# Patient Record
Sex: Male | Born: 1950 | Race: White | Hispanic: No | Marital: Married | Smoking: Former smoker
Health system: Southern US, Community
[De-identification: ages and names within clinical notes are randomized; demographics above are authoritative.]

## PROBLEM LIST (undated history)

## (undated) DIAGNOSIS — R0600 Dyspnea, unspecified: Secondary | ICD-10-CM

## (undated) DIAGNOSIS — M1990 Unspecified osteoarthritis, unspecified site: Secondary | ICD-10-CM

## (undated) DIAGNOSIS — I272 Pulmonary hypertension, unspecified: Secondary | ICD-10-CM

## (undated) DIAGNOSIS — I10 Essential (primary) hypertension: Secondary | ICD-10-CM

## (undated) DIAGNOSIS — R0602 Shortness of breath: Secondary | ICD-10-CM

## (undated) DIAGNOSIS — G4730 Sleep apnea, unspecified: Secondary | ICD-10-CM

## (undated) DIAGNOSIS — M109 Gout, unspecified: Secondary | ICD-10-CM

## (undated) DIAGNOSIS — N200 Calculus of kidney: Secondary | ICD-10-CM

## (undated) DIAGNOSIS — J45909 Unspecified asthma, uncomplicated: Secondary | ICD-10-CM

## (undated) DIAGNOSIS — E785 Hyperlipidemia, unspecified: Secondary | ICD-10-CM

## (undated) DIAGNOSIS — I4891 Unspecified atrial fibrillation: Secondary | ICD-10-CM

## (undated) DIAGNOSIS — T7840XA Allergy, unspecified, initial encounter: Secondary | ICD-10-CM

## (undated) DIAGNOSIS — Z8619 Personal history of other infectious and parasitic diseases: Secondary | ICD-10-CM

## (undated) DIAGNOSIS — I872 Venous insufficiency (chronic) (peripheral): Secondary | ICD-10-CM

## (undated) DIAGNOSIS — T753XXA Motion sickness, initial encounter: Secondary | ICD-10-CM

## (undated) DIAGNOSIS — R739 Hyperglycemia, unspecified: Secondary | ICD-10-CM

## (undated) DIAGNOSIS — R319 Hematuria, unspecified: Secondary | ICD-10-CM

## (undated) DIAGNOSIS — Q211 Atrial septal defect, unspecified: Secondary | ICD-10-CM

## (undated) DIAGNOSIS — Z87442 Personal history of urinary calculi: Secondary | ICD-10-CM

## (undated) DIAGNOSIS — R7301 Impaired fasting glucose: Secondary | ICD-10-CM

## (undated) DIAGNOSIS — L0390 Cellulitis, unspecified: Secondary | ICD-10-CM

## (undated) DIAGNOSIS — M549 Dorsalgia, unspecified: Secondary | ICD-10-CM

## (undated) DIAGNOSIS — H409 Unspecified glaucoma: Secondary | ICD-10-CM

## (undated) DIAGNOSIS — M1712 Unilateral primary osteoarthritis, left knee: Secondary | ICD-10-CM

## (undated) HISTORY — DX: Cellulitis, unspecified: L03.90

## (undated) HISTORY — DX: Venous insufficiency (chronic) (peripheral): I87.2

## (undated) HISTORY — DX: Hyperlipidemia, unspecified: E78.5

## (undated) HISTORY — DX: Gout, unspecified: M10.9

## (undated) HISTORY — DX: Allergy, unspecified, initial encounter: T78.40XA

## (undated) HISTORY — DX: Dorsalgia, unspecified: M54.9

## (undated) HISTORY — DX: Unspecified asthma, uncomplicated: J45.909

## (undated) HISTORY — PX: EYE SURGERY: SHX253

## (undated) HISTORY — DX: Unspecified glaucoma: H40.9

## (undated) HISTORY — DX: Unspecified atrial fibrillation: I48.91

## (undated) HISTORY — DX: Sleep apnea, unspecified: G47.30

## (undated) HISTORY — PX: ENDOVENOUS ABLATION SAPHENOUS VEIN W/ LASER: SUR449

## (undated) HISTORY — DX: Calculus of kidney: N20.0

## (undated) HISTORY — PX: APPENDECTOMY: SHX54

## (undated) HISTORY — DX: Essential (primary) hypertension: I10

## (undated) HISTORY — PX: CARDIAC CATHETERIZATION: SHX172

## (undated) HISTORY — DX: Personal history of other infectious and parasitic diseases: Z86.19

## (undated) HISTORY — PX: LEG SKIN LESION  BIOPSY / EXCISION: SUR473

---

## 2011-09-26 DIAGNOSIS — E669 Obesity, unspecified: Secondary | ICD-10-CM | POA: Insufficient documentation

## 2011-09-26 DIAGNOSIS — M549 Dorsalgia, unspecified: Secondary | ICD-10-CM | POA: Insufficient documentation

## 2011-10-07 ENCOUNTER — Ambulatory Visit: Payer: Self-pay | Admitting: Internal Medicine

## 2011-10-15 DIAGNOSIS — R319 Hematuria, unspecified: Secondary | ICD-10-CM | POA: Insufficient documentation

## 2011-10-15 DIAGNOSIS — L989 Disorder of the skin and subcutaneous tissue, unspecified: Secondary | ICD-10-CM | POA: Insufficient documentation

## 2013-03-22 LAB — BASIC METABOLIC PANEL
BUN: 16 mg/dL (ref 4–21)
CREATININE: 0.9 mg/dL (ref 0.6–1.3)
GLUCOSE: 4 mg/dL
POTASSIUM: 4 mmol/L (ref 3.4–5.3)
Sodium: 140 mmol/L (ref 137–147)

## 2013-03-22 LAB — HEPATIC FUNCTION PANEL
ALT: 24 U/L (ref 10–40)
AST: 23 U/L (ref 14–40)
Alkaline Phosphatase: 61 U/L (ref 25–125)
Bilirubin, Direct: 0.3 mg/dL (ref 0.01–0.4)
Bilirubin, Total: 1.2 mg/dL

## 2013-03-22 LAB — LIPID PANEL
Cholesterol: 111 mg/dL (ref 0–200)
HDL: 32 mg/dL — AB (ref 35–70)
LDL CALC: 57 mg/dL
Triglycerides: 109 mg/dL (ref 40–160)

## 2013-03-22 LAB — CBC AND DIFFERENTIAL
HCT: 46 % (ref 41–53)
HEMOGLOBIN: 18.1 g/dL — AB (ref 13.5–17.5)
PLATELETS: 196 10*3/uL (ref 150–399)
WBC: 7.9 10^3/mL

## 2013-09-22 LAB — PROTIME-INR

## 2014-02-09 LAB — POCT INR: INR: 2.7 — AB (ref <=1.1)

## 2014-02-09 LAB — PROTIME-INR: PROTIME: 29.1 s — AB (ref 10.0–13.8)

## 2014-02-11 DIAGNOSIS — G4730 Sleep apnea, unspecified: Secondary | ICD-10-CM | POA: Insufficient documentation

## 2014-02-11 DIAGNOSIS — Z9889 Other specified postprocedural states: Secondary | ICD-10-CM | POA: Insufficient documentation

## 2014-03-16 ENCOUNTER — Encounter: Payer: Self-pay | Admitting: General Surgery

## 2014-03-16 ENCOUNTER — Ambulatory Visit (INDEPENDENT_AMBULATORY_CARE_PROVIDER_SITE_OTHER): Payer: BC Managed Care – PPO | Admitting: General Surgery

## 2014-03-16 VITALS — BP 140/72 | HR 74 | Resp 12 | Ht 73.0 in | Wt 286.0 lb

## 2014-03-16 DIAGNOSIS — L089 Local infection of the skin and subcutaneous tissue, unspecified: Secondary | ICD-10-CM

## 2014-03-16 DIAGNOSIS — L03313 Cellulitis of chest wall: Secondary | ICD-10-CM

## 2014-03-16 DIAGNOSIS — L729 Follicular cyst of the skin and subcutaneous tissue, unspecified: Principal | ICD-10-CM

## 2014-03-16 NOTE — Progress Notes (Signed)
Patient ID: Michael Blevins, male   DOB: August 30, 1950, 63 y.o.   MRN: [id number]  Chief Complaint  Patient presents with  . Abscess    HPI Michael Blevins is a 63 y.o. male.  Here today for evaluation of a chest wall abscess.He noticed the area about 5 years ago.Over the past couple of years the area has got bigger. On 03/11/14 he states the area was red and swollen, very painful. He saw Dr. Ouida Sills on 03/14/14 and is currently taking Keflex 500mg  four times daily. HPI  Past Medical History  Diagnosis Date  . Hypertension   . Kidney stone   . Venous insufficiency   . Back pain   . Atrial fibrillation   . Hyperlipidemia   . Sleep apnea   . Allergy     cats and pollen    Past Surgical History  Procedure Laterality Date  . Cardiac catheterization    . Appendectomy      History reviewed. No pertinent family history.  Social History History  Substance Use Topics  . Smoking status: Former Research scientist (life sciences)  . Smokeless tobacco: Not on file  . Alcohol Use: No    No Known Allergies  Current Outpatient Prescriptions  Medication Sig Dispense Refill  . amLODipine (NORVASC) 10 MG tablet Take 10 mg by mouth daily.      . cephALEXin (KEFLEX) 500 MG capsule Take 500 mg by mouth 4 (four) times daily.      . digoxin (LANOXIN) 0.125 MG tablet Take 0.125 mg by mouth daily.      . furosemide (LASIX) 20 MG tablet Take 20 mg by mouth daily.      Marland Kitchen lisinopril (PRINIVIL,ZESTRIL) 20 MG tablet Take 20 mg by mouth daily.      Marland Kitchen lovastatin (MEVACOR) 20 MG tablet Take 20 mg by mouth at bedtime.      . metoprolol succinate (TOPROL-XL) 50 MG 24 hr tablet Take 50 mg by mouth daily. Take with or immediately following a meal.      . warfarin (COUMADIN) 2 MG tablet Take 2 mg by mouth daily.      Marland Kitchen warfarin (COUMADIN) 5 MG tablet Take 5 mg by mouth daily.       No current facility-administered medications for this visit.    Review of Systems Review of Systems  Constitutional: Negative.   Respiratory:  Negative.   Cardiovascular: Negative.     Blood pressure 140/72, pulse 74, resp. rate 12, height 6\' 1"  (1.854 m), weight 286 lb (129.729 kg).  Physical Exam Physical Exam  Constitutional: He appears well-developed and well-nourished.  Eyes: Conjunctivae are normal.  Neck: No thyromegaly present.  Cardiovascular: Normal heart sounds.  An irregularly irregular rhythm present.  Pulmonary/Chest: Effort normal and breath sounds normal.    Lymphadenopathy:    He has no cervical adenopathy.    Data Reviewed None  Assessment    Infected cyst chest wall.      Plan    Recommended drainage and completed with consent.  After Chloro Prep, 65ml 1% xylocaine instilled. 56mm cruciate incision made. 4-5 ml thick pus drained and some cyst contents. Culture obtained. 4/4 dressing. Pt advised to return in 5 weeks. Continue with Keflex till culture report available.        SANKAR,SEEPLAPUTHUR G 03/16/2014, 11:04 AM

## 2014-03-16 NOTE — Patient Instructions (Signed)
Pt to follow up in 5 weeks. Cyst Removal Your caregiver has removed a cyst. A cyst is a sac containing a semi-solid material. Cysts may occur any place on your body. They may remain small for years or gradually get larger. A sebaceous cyst is an enlarged (dilated) sweat gland filled with old sweat (sebum). Unattended, these may become large (the size of a softball) over several years time. These are often removed for improved appearance (cosmetic) reasons or before they become infected to form an abscess. An abscess is an infected cyst. HOME CARE INSTRUCTIONS   Keep your bandage clean and dry. You may change your bandage after 24 hours. If your bandage sticks, use warm water to gently loosen it. Pat the area dry with a clean towel before putting on another bandage.  If possible, keep the area where the cyst was removed raised to relieve soreness, swelling, and promote healing.  If you have stitches, keep them clean and dry.  You may clean your stitches gently with a cotton swab dipped in warm soapy water.  Do not soak the area where the cyst was removed or go swimming. You may shower.  Do not overuse the area where your cyst was removed.  Return in 7 days or as directed to have your stitches removed.  Take medicines as instructed by your caregiver. SEEK IMMEDIATE MEDICAL CARE IF:   An oral temperature above 102 F (38.9 C) develops, not controlled by medication.  Blood continues to soak through the bandage.  You have increasing pain in the area where your cyst was removed.  You have redness, swelling, pus, a bad smell, soreness (inflammation), or red streaks coming away from the stitches. These are signs of infection. MAKE SURE YOU:   Understand these instructions.  Will watch your condition.  Will get help right away if you are not doing well or get worse. Document Released: 05/24/2000 Document Revised: 08/19/2011 Document Reviewed: 09/17/2007 [facility] Patient Information 2015  [place], [place]. This information is not intended to replace advice given to you by your health care provider. Make sure you discuss any questions you have with your health care provider.

## 2014-03-20 LAB — ANAEROBIC AND AEROBIC CULTURE

## 2014-04-20 ENCOUNTER — Encounter: Payer: Self-pay | Admitting: General Surgery

## 2014-04-20 ENCOUNTER — Ambulatory Visit (INDEPENDENT_AMBULATORY_CARE_PROVIDER_SITE_OTHER): Payer: BC Managed Care – PPO | Admitting: General Surgery

## 2014-04-20 VITALS — BP 124/84 | HR 78 | Resp 16 | Ht 73.0 in | Wt 289.0 lb

## 2014-04-20 DIAGNOSIS — L089 Local infection of the skin and subcutaneous tissue, unspecified: Secondary | ICD-10-CM

## 2014-04-20 DIAGNOSIS — L729 Follicular cyst of the skin and subcutaneous tissue, unspecified: Secondary | ICD-10-CM

## 2014-04-20 MED ORDER — DOXYCYCLINE HYCLATE 100 MG PO CAPS: 100.0000 mg | ORAL_CAPSULE | Freq: Two times a day (BID) | ORAL | Status: DC

## 2014-04-20 NOTE — Progress Notes (Signed)
Patient ID: Michael Blevins, male   DOB: Jul 13, 1950, 63 y.o.   MRN: [id number]  Chief Complaint  Patient presents with  . Follow-up    cyst chest wall    HPI Michael Blevins is a 63 y.o. male here tofay following up from a chest wall cyst.He states the area is still draining.  HPI  Past Medical History  Diagnosis Date  . Hypertension   . Kidney stone   . Venous insufficiency   . Back pain   . Atrial fibrillation   . Hyperlipidemia   . Sleep apnea   . Allergy     cats and pollen    Past Surgical History  Procedure Laterality Date  . Cardiac catheterization    . Appendectomy      History reviewed. No pertinent family history.  Social History History  Substance Use Topics  . Smoking status: Former Research scientist (life sciences)  . Smokeless tobacco: Not on file  . Alcohol Use: No    No Known Allergies  Current Outpatient Prescriptions  Medication Sig Dispense Refill  . amLODipine (NORVASC) 10 MG tablet Take 10 mg by mouth daily.    . digoxin (LANOXIN) 0.125 MG tablet Take 0.125 mg by mouth daily.    . furosemide (LASIX) 20 MG tablet Take 20 mg by mouth daily.    Marland Kitchen lisinopril (PRINIVIL,ZESTRIL) 20 MG tablet Take 20 mg by mouth daily.    Marland Kitchen lovastatin (MEVACOR) 20 MG tablet Take 20 mg by mouth at bedtime.    . metoprolol succinate (TOPROL-XL) 50 MG 24 hr tablet Take 50 mg by mouth daily. Take with or immediately following a meal.    . warfarin (COUMADIN) 2 MG tablet Take 2 mg by mouth daily.    Marland Kitchen warfarin (COUMADIN) 5 MG tablet Take 5 mg by mouth daily.    Marland Kitchen doxycycline (VIBRAMYCIN) 100 MG capsule Take 1 capsule (100 mg total) by mouth 2 (two) times daily. 14 capsule 0   No current facility-administered medications for this visit.    Review of Systems Review of Systems  Constitutional: Negative.   Respiratory: Negative.   Cardiovascular: Negative.     Blood pressure 124/84, pulse 78, resp. rate 16, height 6\' 1"  (1.854 m), weight 289 lb (131.09 kg).  Physical Exam Physical  Exam The prior cyst on chest walll is now about 1.5cm, small opening in middle with spotting on  The gauze. No surrounding redness.  Data Reviewed None  Assessment    Infected cyst chest wall, much better after drainage.     Plan    Patient to return in one week for a excision chest wall cyst. Rx given for Doxycycline 100mg  bid. He is to hold Coumadin for 5 days- this was okayed by Dr. Frances Furbish 04/20/2014, 9:50 AM

## 2014-04-26 ENCOUNTER — Ambulatory Visit (INDEPENDENT_AMBULATORY_CARE_PROVIDER_SITE_OTHER): Payer: BC Managed Care – PPO | Admitting: General Surgery

## 2014-04-26 ENCOUNTER — Encounter: Payer: Self-pay | Admitting: General Surgery

## 2014-04-26 VITALS — BP 144/72 | HR 84 | Resp 14 | Ht 73.0 in | Wt 289.0 lb

## 2014-04-26 DIAGNOSIS — R222 Localized swelling, mass and lump, trunk: Secondary | ICD-10-CM

## 2014-04-26 NOTE — Patient Instructions (Addendum)
Patient to return in 2 week- nurse check and suture removal.

## 2014-04-26 NOTE — Progress Notes (Signed)
This is a 63 year old male here today for a excision chest wall mass.   Procedure: Excision chest wall skin cyst.  Anesthetic: 1% xylocaine mixed with 0.5 % marcaine.  Area prepped with Chloro Prep, draped. Transverse elliptical incision 3 cm long made. The cyst was excised out from subcutaneous space. Bleddding controlled with cauther and suture ligatures of 3-0 vicryl. Deeper tissues was closed with 3-0 Vicryl.  Skin closed with vertcal 4-0 Nylon stitches. Dressed with neosporin, Telfa and tegaderm. No immediate problems from procedure.

## 2014-04-28 LAB — PATHOLOGY

## 2014-05-02 ENCOUNTER — Telehealth: Payer: Self-pay

## 2014-05-02 NOTE — Telephone Encounter (Signed)
-----   Message from Christene Lye, MD sent at 04/29/2014  7:05 AM EST ----- Please let pt pt know the pathology was normal.

## 2014-05-02 NOTE — Telephone Encounter (Signed)
Message left for patient to call back to get pathology results

## 2014-05-02 NOTE — Telephone Encounter (Signed)
Notified patient as instructed, patient pleased. Discussed follow-up appointments, patient agrees  

## 2014-05-09 ENCOUNTER — Ambulatory Visit (INDEPENDENT_AMBULATORY_CARE_PROVIDER_SITE_OTHER): Payer: Self-pay | Admitting: *Deleted

## 2014-05-09 DIAGNOSIS — R222 Localized swelling, mass and lump, trunk: Secondary | ICD-10-CM

## 2014-05-09 NOTE — Progress Notes (Signed)
The sutures were removed and steri strips applied. 

## 2014-07-22 ENCOUNTER — Encounter: Payer: Self-pay | Admitting: Internal Medicine

## 2014-07-22 ENCOUNTER — Ambulatory Visit (INDEPENDENT_AMBULATORY_CARE_PROVIDER_SITE_OTHER): Payer: 59 | Admitting: Internal Medicine

## 2014-07-22 ENCOUNTER — Encounter (INDEPENDENT_AMBULATORY_CARE_PROVIDER_SITE_OTHER): Payer: Self-pay

## 2014-07-22 VITALS — BP 111/81 | HR 73 | Temp 97.6°F | Ht 73.0 in | Wt 295.1 lb

## 2014-07-22 DIAGNOSIS — G4733 Obstructive sleep apnea (adult) (pediatric): Secondary | ICD-10-CM

## 2014-07-22 DIAGNOSIS — I482 Chronic atrial fibrillation, unspecified: Secondary | ICD-10-CM

## 2014-07-22 DIAGNOSIS — N200 Calculus of kidney: Secondary | ICD-10-CM

## 2014-07-22 DIAGNOSIS — Z125 Encounter for screening for malignant neoplasm of prostate: Secondary | ICD-10-CM

## 2014-07-22 DIAGNOSIS — E7800 Pure hypercholesterolemia, unspecified: Secondary | ICD-10-CM

## 2014-07-22 DIAGNOSIS — I872 Venous insufficiency (chronic) (peripheral): Secondary | ICD-10-CM

## 2014-07-22 DIAGNOSIS — I10 Essential (primary) hypertension: Secondary | ICD-10-CM

## 2014-07-22 NOTE — Progress Notes (Signed)
Pre visit review using our clinic review tool, if applicable. No additional management support is needed unless otherwise documented below in the visit note. 

## 2014-07-25 ENCOUNTER — Encounter: Payer: Self-pay | Admitting: Internal Medicine

## 2014-07-25 ENCOUNTER — Telehealth: Payer: Self-pay | Admitting: Internal Medicine

## 2014-07-25 DIAGNOSIS — N200 Calculus of kidney: Secondary | ICD-10-CM | POA: Insufficient documentation

## 2014-07-25 DIAGNOSIS — I872 Venous insufficiency (chronic) (peripheral): Secondary | ICD-10-CM | POA: Insufficient documentation

## 2014-07-25 DIAGNOSIS — I4891 Unspecified atrial fibrillation: Secondary | ICD-10-CM | POA: Insufficient documentation

## 2014-07-25 DIAGNOSIS — G4733 Obstructive sleep apnea (adult) (pediatric): Secondary | ICD-10-CM | POA: Insufficient documentation

## 2014-07-25 DIAGNOSIS — E7800 Pure hypercholesterolemia, unspecified: Secondary | ICD-10-CM | POA: Insufficient documentation

## 2014-07-25 DIAGNOSIS — I10 Essential (primary) hypertension: Secondary | ICD-10-CM | POA: Insufficient documentation

## 2014-07-25 NOTE — Assessment & Plan Note (Signed)
Has a history of kidney stones.  Doing well now.  Never saw urology.

## 2014-07-25 NOTE — Telephone Encounter (Signed)
Has known sleep apnea.  Old machine.  Needs a new one.  [place]  Initial company was [employer].  Please contact - to see how we can get a new machine and what will be required to get a new machine.  Thanks

## 2014-07-25 NOTE — Assessment & Plan Note (Signed)
Diagnosed with sleep apnea.  Has an old machine.  Needs a new one.

## 2014-07-25 NOTE — Progress Notes (Signed)
Patient ID: Michael Blevins, male   DOB: [birth year], 64 y.o.   MRN: [id number]   Subjective:    Patient ID: Michael Blevins, male    DOB: 1950/09/29, 64 y.o.   MRN: [id number]  HPI  Patient here to establish care.  Has a history of hypertension, atrial fib, hypercholesterolemia and sleep apnea.  Was seeing Dr Ouida Sills.  Establishing care here now.  Sees Dr Saralyn Pilar for his atrial fib.  Was diagnosed four years ago.  On coumadin.  PT/INR followed by cardiology.  He is going to the gym.  Gaining strength.  Goes to the gym 3-4x/week.  Goes hiking and kayaking.  Will notice occasional racing at night.  Heart stable.  Has sleep apnea.  Diagnosed twelve years ago.  Needs a new machine.  His is [age] years old.     Past Medical History  Diagnosis Date  . Hypertension   . Kidney stone   . Venous insufficiency   . Back pain   . Atrial fibrillation   . Hyperlipidemia   . Sleep apnea   . Allergy     cats and pollen  . Asthma   . History of chicken pox   . Cellulitis     Current Outpatient Prescriptions on File Prior to Visit  Medication Sig Dispense Refill  . amLODipine (NORVASC) 10 MG tablet Take 10 mg by mouth daily.    . digoxin (LANOXIN) 0.125 MG tablet Take 0.125 mg by mouth daily.    . furosemide (LASIX) 20 MG tablet Take 20 mg by mouth daily.    Marland Kitchen lisinopril (PRINIVIL,ZESTRIL) 20 MG tablet Take 20 mg by mouth daily.    Marland Kitchen lovastatin (MEVACOR) 20 MG tablet Take 20 mg by mouth at bedtime.    . metoprolol (LOPRESSOR) 50 MG tablet Take 50 mg by mouth 2 (two) times daily.   5  . warfarin (COUMADIN) 2 MG tablet Take 2 mg by mouth daily.    Marland Kitchen warfarin (COUMADIN) 5 MG tablet Take 5 mg by mouth daily.     No current facility-administered medications on file prior to visit.    Review of Systems  Constitutional: Negative for appetite change and unexpected weight change.  HENT: Negative for congestion and sinus pressure.   Eyes: Negative for pain and discharge.  Respiratory: Negative  for cough, chest tightness and shortness of breath.   Cardiovascular: Positive for palpitations (occasional racing heart as outlined.  ). Negative for chest pain.  Gastrointestinal: Negative for nausea, vomiting, abdominal pain and diarrhea.  Genitourinary: Negative for dysuria and frequency.  Musculoskeletal: Negative for back pain and joint swelling.  Skin: Negative for color change and rash.  Neurological: Negative for dizziness, light-headedness and headaches.  Hematological: Negative for adenopathy. Does not bruise/bleed easily.  Psychiatric/Behavioral: Negative for confusion and dysphoric mood.       Objective:    Physical Exam  Constitutional: He is oriented to person, place, and time. He appears well-developed and well-nourished. No distress.  HENT:  Nose: Nose normal.  Mouth/Throat: Oropharynx is clear and moist.  Neck: Neck supple. No thyromegaly present.  Cardiovascular:  Rate controlled.  Irregular rhythm.    Pulmonary/Chest: Effort normal and breath sounds normal. No respiratory distress.  Abdominal: Soft. Bowel sounds are normal. There is no tenderness.  Musculoskeletal: He exhibits no edema.  Lymphadenopathy:    He has no cervical adenopathy.  Neurological: He is alert and oriented to person, place, and time.  Skin: No rash noted. No erythema.  Psychiatric:  He has a normal mood and affect. His behavior is normal.    BP 111/81 mmHg  Pulse 73  Temp(Src) 97.6 F (36.4 C) (Oral)  Ht 6\' 1"  (1.854 m)  Wt 295 lb 2 oz (133.868 kg)  BMI 38.95 kg/m2  SpO2 97% Wt Readings from Last 3 Encounters:  07/22/14 295 lb 2 oz (133.868 kg)  04/26/14 289 lb (131.09 kg)  04/20/14 289 lb (131.09 kg)     Lab Results  Component Value Date   WBC 7.9 03/22/2013   HGB 18.1* 03/22/2013   HCT 46 03/22/2013   PLT 196 03/22/2013   CHOL 111 03/22/2013   TRIG 109 03/22/2013   HDL 32* 03/22/2013   LDLCALC 57 03/22/2013   ALT 24 03/22/2013   AST 23 03/22/2013   NA 140 03/22/2013     K 4.0 03/22/2013   CREATININE 0.9 03/22/2013   BUN 16 03/22/2013   INR 2.7* 02/09/2014       Assessment & Plan:   Problem List Items Addressed This Visit    Atrial fibrillation - Primary    Sees Dr Saralyn Pilar.  On coumadin.  Rate controlled.  Currently doing well.  Follow.        Relevant Orders   CBC with Differential/Platelet   TSH   Essential hypertension    Blood pressure elevated on my recheck (systolic 256L).  Have him spot check his pressure.  Get him back in soon to reassess.  Check metabolic panel.        Relevant Orders   Basic metabolic panel   Hypercholesterolemia    Low cholesterol diet and exercise.  On lovastatin.  Check lipid panel and liver function tests.        Relevant Orders   Lipid panel   Hepatic function panel   Nephrolithiasis    Has a history of kidney stones.  Doing well now.  Never saw urology.        Obstructive sleep apnea    Diagnosed with sleep apnea.  Has an old machine.  Needs a new one.        Venous insufficiency    Is s/p laser ablation.  Has had cellulitis x 4.  Doing well now.  Follow.  Continue compression hose.         Other Visit Diagnoses    Prostate cancer screening        Relevant Orders    PSA      I spent 45 minutes with the patient and more than 50% of the time was spent in consultation regarding the above.     Einar Pheasant, MD

## 2014-07-25 NOTE — Assessment & Plan Note (Signed)
Blood pressure elevated on my recheck (systolic 641R).  Have him spot check his pressure.  Get him back in soon to reassess.  Check metabolic panel.

## 2014-07-25 NOTE — Assessment & Plan Note (Signed)
Sees Dr Saralyn Pilar.  On coumadin.  Rate controlled.  Currently doing well.  Follow.

## 2014-07-25 NOTE — Assessment & Plan Note (Signed)
Low cholesterol diet and exercise.  On lovastatin.  Check lipid panel and liver function tests.

## 2014-07-25 NOTE — Assessment & Plan Note (Signed)
Is s/p laser ablation.  Has had cellulitis x 4.  Doing well now.  Follow.  Continue compression hose.

## 2014-07-26 NOTE — Telephone Encounter (Signed)
Records requested

## 2014-07-27 NOTE — Telephone Encounter (Signed)
Records requested a second time

## 2014-08-05 ENCOUNTER — Other Ambulatory Visit (INDEPENDENT_AMBULATORY_CARE_PROVIDER_SITE_OTHER): Payer: 59

## 2014-08-05 DIAGNOSIS — Z125 Encounter for screening for malignant neoplasm of prostate: Secondary | ICD-10-CM

## 2014-08-05 DIAGNOSIS — E7800 Pure hypercholesterolemia, unspecified: Secondary | ICD-10-CM

## 2014-08-05 DIAGNOSIS — I10 Essential (primary) hypertension: Secondary | ICD-10-CM

## 2014-08-05 DIAGNOSIS — I482 Chronic atrial fibrillation, unspecified: Secondary | ICD-10-CM

## 2014-08-05 LAB — HEPATIC FUNCTION PANEL
ALK PHOS: 74 U/L (ref 39–117)
ALT: 28 U/L (ref 0–53)
AST: 24 U/L (ref 0–37)
Albumin: 3.9 g/dL (ref 3.5–5.2)
BILIRUBIN DIRECT: 0.2 mg/dL (ref 0.0–0.3)
TOTAL PROTEIN: 6.4 g/dL (ref 6.0–8.3)
Total Bilirubin: 1 mg/dL (ref 0.2–1.2)

## 2014-08-05 LAB — CBC WITH DIFFERENTIAL/PLATELET
BASOS ABS: 0 10*3/uL (ref 0.0–0.1)
Basophils Relative: 0.3 % (ref 0.0–3.0)
EOS ABS: 0.2 10*3/uL (ref 0.0–0.7)
EOS PCT: 2.4 % (ref 0.0–5.0)
HEMATOCRIT: 49.4 % (ref 39.0–52.0)
Hemoglobin: 17.1 g/dL — ABNORMAL HIGH (ref 13.0–17.0)
Lymphocytes Relative: 21.7 % (ref 12.0–46.0)
Lymphs Abs: 2.1 10*3/uL (ref 0.7–4.0)
MCHC: 34.6 g/dL (ref 30.0–36.0)
MCV: 86.2 fl (ref 78.0–100.0)
MONO ABS: 0.8 10*3/uL (ref 0.1–1.0)
MONOS PCT: 8 % (ref 3.0–12.0)
Neutro Abs: 6.6 10*3/uL (ref 1.4–7.7)
Neutrophils Relative %: 67.6 % (ref 43.0–77.0)
Platelets: 188 10*3/uL (ref 150.0–400.0)
RBC: 5.73 Mil/uL (ref 4.22–5.81)
RDW: 12.9 % (ref 11.5–15.5)
WBC: 9.7 10*3/uL (ref 4.0–10.5)

## 2014-08-05 LAB — BASIC METABOLIC PANEL
BUN: 19 mg/dL (ref 6–23)
CALCIUM: 9 mg/dL (ref 8.4–10.5)
CHLORIDE: 105 meq/L (ref 96–112)
CO2: 34 mEq/L — ABNORMAL HIGH (ref 19–32)
CREATININE: 0.91 mg/dL (ref 0.40–1.50)
GFR: 89.07 mL/min (ref >60.00)
Glucose, Bld: 69 mg/dL — ABNORMAL LOW (ref 70–99)
Potassium: 3.9 mEq/L (ref 3.5–5.1)
Sodium: 143 mEq/L (ref 135–145)

## 2014-08-05 LAB — LIPID PANEL
Cholesterol: 118 mg/dL (ref 0–200)
HDL: 34.1 mg/dL — ABNORMAL LOW (ref >39.00)
LDL Cholesterol: 58 mg/dL (ref 0–99)
NonHDL: 83.9
Total CHOL/HDL Ratio: 3
Triglycerides: 129 mg/dL (ref 0.0–149.0)
VLDL: 25.8 mg/dL (ref 0.0–40.0)

## 2014-08-05 LAB — TSH: TSH: 1.67 u[IU]/mL (ref 0.35–4.50)

## 2014-08-05 LAB — PSA: PSA: 2.81 ng/mL (ref 0.10–4.00)

## 2014-08-08 ENCOUNTER — Encounter: Payer: Self-pay | Admitting: *Deleted

## 2014-09-07 ENCOUNTER — Ambulatory Visit: Payer: 59 | Admitting: Internal Medicine

## 2014-11-02 ENCOUNTER — Ambulatory Visit (INDEPENDENT_AMBULATORY_CARE_PROVIDER_SITE_OTHER): Payer: 59 | Admitting: Internal Medicine

## 2014-11-02 ENCOUNTER — Encounter: Payer: Self-pay | Admitting: Internal Medicine

## 2014-11-02 VITALS — BP 130/80 | HR 64 | Temp 98.3°F | Ht 73.0 in | Wt 296.0 lb

## 2014-11-02 DIAGNOSIS — E7800 Pure hypercholesterolemia, unspecified: Secondary | ICD-10-CM

## 2014-11-02 DIAGNOSIS — I872 Venous insufficiency (chronic) (peripheral): Secondary | ICD-10-CM | POA: Diagnosis not present

## 2014-11-02 DIAGNOSIS — G4733 Obstructive sleep apnea (adult) (pediatric): Secondary | ICD-10-CM

## 2014-11-02 DIAGNOSIS — I482 Chronic atrial fibrillation, unspecified: Secondary | ICD-10-CM

## 2014-11-02 DIAGNOSIS — I10 Essential (primary) hypertension: Secondary | ICD-10-CM | POA: Diagnosis not present

## 2014-11-02 DIAGNOSIS — Z0000 Encounter for general adult medical examination without abnormal findings: Secondary | ICD-10-CM

## 2014-11-02 NOTE — Progress Notes (Signed)
Patient ID: Michael Blevins, male   DOB: [birth year], 64 y.o.   MRN: [id number]   Subjective:    Patient ID: Michael Blevins, male    DOB: [birth year], 64 y.o.   MRN: [id number]  HPI  Patient here for a scheduled follow up.  Planning to retire this year.  Stress is better.  Job is better.  No cardiac symptoms with increased activity or exertion.  Knees bother him when he goes up stairs.  Does not bother him walking.  Has sleep apnea.  Needs a new machine.  Unable to get copy of old sleep study.  Last study 10-12 years ago.  Bowels stable.    Past Medical History  Diagnosis Date  . Hypertension   . Kidney stone   . Venous insufficiency   . Back pain   . Atrial fibrillation   . Hyperlipidemia   . Sleep apnea   . Allergy     cats and pollen  . Asthma   . History of chicken pox   . Cellulitis     Current Outpatient Prescriptions on File Prior to Visit  Medication Sig Dispense Refill  . amLODipine (NORVASC) 10 MG tablet Take 10 mg by mouth daily.    . digoxin (LANOXIN) 0.125 MG tablet Take 0.125 mg by mouth daily.    . furosemide (LASIX) 20 MG tablet Take 20 mg by mouth daily.    Marland Kitchen lisinopril (PRINIVIL,ZESTRIL) 20 MG tablet Take 20 mg by mouth daily.    Marland Kitchen lovastatin (MEVACOR) 20 MG tablet Take 20 mg by mouth at bedtime.    . metoprolol (LOPRESSOR) 50 MG tablet Take 50 mg by mouth 2 (two) times daily.   5  . warfarin (COUMADIN) 2 MG tablet Take 2 mg by mouth daily.    Marland Kitchen warfarin (COUMADIN) 5 MG tablet Take 5 mg by mouth daily.     No current facility-administered medications on file prior to visit.    Review of Systems  Constitutional: Negative for appetite change and unexpected weight change.  HENT: Negative for congestion and sinus pressure.   Respiratory: Negative for cough, chest tightness and shortness of breath.   Cardiovascular: Negative for chest pain and palpitations.       Wears compression hose to help with swelling.   Gastrointestinal: Negative for nausea,  vomiting, abdominal pain and diarrhea.  Musculoskeletal:       Knee pain.  Desires no further intervention.  Hurts when going up stairs.  Does not affect him walking.   Neurological: Negative for dizziness, light-headedness and headaches.  Psychiatric/Behavioral: Negative for dysphoric mood and agitation.       Objective:    Physical Exam  Constitutional: He appears well-developed and well-nourished. No distress.  HENT:  Nose: Nose normal.  Mouth/Throat: Oropharynx is clear and moist.  Neck: Neck supple. No thyromegaly present.  Cardiovascular: Normal rate and regular rhythm.   DP pulses palpable and equal bilaterally.   Pulmonary/Chest: Effort normal and breath sounds normal. No respiratory distress.  Abdominal: Soft. Bowel sounds are normal. There is no tenderness.  Musculoskeletal: He exhibits no edema.  Lymphadenopathy:    He has no cervical adenopathy.  Psychiatric: He has a normal mood and affect. His behavior is normal.    BP 130/80 mmHg  Pulse 64  Temp(Src) 98.3 F (36.8 C) (Oral)  Ht 6\' 1"  (1.854 m)  Wt 296 lb (134.265 kg)  BMI 39.06 kg/m2  SpO2 96% Wt Readings from Last 3 Encounters:  11/02/14 296 lb (  134.265 kg)  07/22/14 295 lb 2 oz (133.868 kg)  04/26/14 289 lb (131.09 kg)     Lab Results  Component Value Date   WBC 9.7 08/05/2014   HGB 17.1* 08/05/2014   HCT 49.4 08/05/2014   PLT 188.0 08/05/2014   GLUCOSE 69* 08/05/2014   CHOL 118 08/05/2014   TRIG 129.0 08/05/2014   HDL 34.10* 08/05/2014   LDLCALC 58 08/05/2014   ALT 28 08/05/2014   AST 24 08/05/2014   NA 143 08/05/2014   K 3.9 08/05/2014   CL 105 08/05/2014   CREATININE 0.91 08/05/2014   BUN 19 08/05/2014   CO2 34* 08/05/2014   TSH 1.67 08/05/2014   PSA 2.81 08/05/2014   INR 2.7* 02/09/2014       Assessment & Plan:   Problem List Items Addressed This Visit    Atrial fibrillation    Sees Dr Saralyn Pilar.  Rate controlled.  On coumadin.  They follow his pt/inr.       Essential  hypertension    Blood pressure doing well.  Continue current medication regimen.  Follow pressures.  Follow metabolic panel.        Relevant Orders   Basic metabolic panel   Health care maintenance    Will schedule physical.  PSA 08/05/14 - 2.81.        Hypercholesterolemia    Low cholesterol diet and exercise.  On lovastatin.  Follow lipid panel and liver function tests.        Relevant Orders   Lipid panel   Hepatic function panel   Obstructive sleep apnea - Primary    Has known sleep apnea.  Old machine.  Last sleep study 10-12 years ago.  Needs a new machine.  Unable to get a copy of his old sleep study.  Schedule split night sleep study to evaluate.        Relevant Orders   Ambulatory referral to Sleep Studies   CBC with Differential/Platelet   Venous insufficiency    Is s/p laser ablation.  Doing well.  Continue compression hose.          I spent 25 minutes with the patient and more than 50% of the time was spent in consultation regarding the above.     Einar Pheasant, MD

## 2014-11-02 NOTE — Progress Notes (Signed)
Pre visit review using our clinic review tool, if applicable. No additional management support is needed unless otherwise documented below in the visit note. 

## 2014-11-05 NOTE — Telephone Encounter (Signed)
Spoke to pt at office visit.  Informed unable to get the results of sleep study.  Agreeable to repeat sleep study.

## 2014-11-06 ENCOUNTER — Encounter: Payer: Self-pay | Admitting: Internal Medicine

## 2014-11-06 DIAGNOSIS — Z0000 Encounter for general adult medical examination without abnormal findings: Secondary | ICD-10-CM | POA: Insufficient documentation

## 2014-11-06 NOTE — Assessment & Plan Note (Signed)
Is s/p laser ablation.  Doing well.  Continue compression hose.

## 2014-11-06 NOTE — Assessment & Plan Note (Signed)
Blood pressure doing well.  Continue current medication regimen.  Follow pressures.  Follow metabolic panel.  

## 2014-11-06 NOTE — Assessment & Plan Note (Signed)
Has known sleep apnea.  Old machine.  Last sleep study 10-12 years ago.  Needs a new machine.  Unable to get a copy of his old sleep study.  Schedule split night sleep study to evaluate.

## 2014-11-06 NOTE — Assessment & Plan Note (Signed)
Will schedule physical.  PSA 08/05/14 - 2.81.

## 2014-11-06 NOTE — Assessment & Plan Note (Signed)
Low cholesterol diet and exercise.  On lovastatin.  Follow lipid panel and liver function tests.   

## 2014-11-06 NOTE — Assessment & Plan Note (Signed)
Sees Dr Saralyn Pilar.  Rate controlled.  On coumadin.  They follow his pt/inr.

## 2014-12-02 ENCOUNTER — Other Ambulatory Visit (INDEPENDENT_AMBULATORY_CARE_PROVIDER_SITE_OTHER): Payer: 59

## 2014-12-02 DIAGNOSIS — E7800 Pure hypercholesterolemia, unspecified: Secondary | ICD-10-CM

## 2014-12-02 DIAGNOSIS — G4733 Obstructive sleep apnea (adult) (pediatric): Secondary | ICD-10-CM

## 2014-12-02 DIAGNOSIS — I10 Essential (primary) hypertension: Secondary | ICD-10-CM

## 2014-12-02 LAB — BASIC METABOLIC PANEL
BUN: 14 mg/dL (ref 6–23)
CHLORIDE: 105 meq/L (ref 96–112)
CO2: 31 mEq/L (ref 19–32)
Calcium: 9 mg/dL (ref 8.4–10.5)
Creatinine, Ser: 0.96 mg/dL (ref 0.40–1.50)
GFR: 83.65 mL/min (ref >60.00)
GLUCOSE: 99 mg/dL (ref 70–99)
POTASSIUM: 4.1 meq/L (ref 3.5–5.1)
Sodium: 141 mEq/L (ref 135–145)

## 2014-12-02 LAB — CBC WITH DIFFERENTIAL/PLATELET
BASOS ABS: 0 10*3/uL (ref 0.0–0.1)
Basophils Relative: 0.5 % (ref 0.0–3.0)
EOS ABS: 0.3 10*3/uL (ref 0.0–0.7)
Eosinophils Relative: 2.6 % (ref 0.0–5.0)
HEMATOCRIT: 49 % (ref 39.0–52.0)
Hemoglobin: 16.6 g/dL (ref 13.0–17.0)
LYMPHS ABS: 2.3 10*3/uL (ref 0.7–4.0)
Lymphocytes Relative: 24.5 % (ref 12.0–46.0)
MCHC: 33.8 g/dL (ref 30.0–36.0)
MCV: 87.6 fl (ref 78.0–100.0)
MONO ABS: 0.7 10*3/uL (ref 0.1–1.0)
MONOS PCT: 7.5 % (ref 3.0–12.0)
Neutro Abs: 6.2 10*3/uL (ref 1.4–7.7)
Neutrophils Relative %: 64.9 % (ref 43.0–77.0)
PLATELETS: 183 10*3/uL (ref 150.0–400.0)
RBC: 5.6 Mil/uL (ref 4.22–5.81)
RDW: 13.1 % (ref 11.5–15.5)
WBC: 9.5 10*3/uL (ref 4.0–10.5)

## 2014-12-02 LAB — LIPID PANEL
CHOLESTEROL: 112 mg/dL (ref 0–200)
HDL: 32.5 mg/dL — ABNORMAL LOW (ref >39.00)
LDL CALC: 60 mg/dL (ref 0–99)
NONHDL: 79.5
Total CHOL/HDL Ratio: 3
Triglycerides: 96 mg/dL (ref 0.0–149.0)
VLDL: 19.2 mg/dL (ref 0.0–40.0)

## 2014-12-02 LAB — HEPATIC FUNCTION PANEL
ALT: 26 U/L (ref 0–53)
AST: 25 U/L (ref 0–37)
Albumin: 3.9 g/dL (ref 3.5–5.2)
Alkaline Phosphatase: 67 U/L (ref 39–117)
BILIRUBIN TOTAL: 1.1 mg/dL (ref 0.2–1.2)
Bilirubin, Direct: 0.2 mg/dL (ref 0.0–0.3)
Total Protein: 6.7 g/dL (ref 6.0–8.3)

## 2014-12-05 ENCOUNTER — Encounter: Payer: Self-pay | Admitting: *Deleted

## 2015-01-10 ENCOUNTER — Telehealth: Payer: Self-pay | Admitting: Internal Medicine

## 2015-01-10 NOTE — Telephone Encounter (Signed)
I placed a copy of his sleep study results on your desk.  I will need this back to be scanned in his chart.  I had ordered for him to be scheduled for a split night sleep study.  Did you need this information for them to schedule.  Actually we were just trying to get new equipment and was under the impression he needed a new sleep study since this one was 64 years old.  Let me know what I need to do.  Thanks

## 2015-01-10 NOTE — Telephone Encounter (Signed)
Pt wife dropped off sleep study results.  Results in Dr. Nicki Reaper box/ rm

## 2015-01-10 NOTE — Telephone Encounter (Signed)
Placed in green folder 

## 2015-01-16 NOTE — Telephone Encounter (Signed)
Per Melissa's note, copy of old sleep study was faxed to [facility] on 01/11/15.

## 2015-01-19 ENCOUNTER — Telehealth: Payer: Self-pay | Admitting: *Deleted

## 2015-01-19 NOTE — Telephone Encounter (Signed)
Pt left voicemail re: Sleep study appointment. Has questions about his appt.

## 2015-01-21 ENCOUNTER — Encounter: Payer: Self-pay | Admitting: Internal Medicine

## 2015-03-06 ENCOUNTER — Encounter: Payer: Self-pay | Admitting: Internal Medicine

## 2015-03-06 ENCOUNTER — Ambulatory Visit (INDEPENDENT_AMBULATORY_CARE_PROVIDER_SITE_OTHER): Payer: 59 | Admitting: Internal Medicine

## 2015-03-06 VITALS — BP 138/100 | HR 85 | Temp 98.2°F | Resp 18 | Ht 71.5 in | Wt 299.4 lb

## 2015-03-06 DIAGNOSIS — Z0000 Encounter for general adult medical examination without abnormal findings: Secondary | ICD-10-CM

## 2015-03-06 DIAGNOSIS — I482 Chronic atrial fibrillation, unspecified: Secondary | ICD-10-CM

## 2015-03-06 DIAGNOSIS — N200 Calculus of kidney: Secondary | ICD-10-CM

## 2015-03-06 DIAGNOSIS — E7800 Pure hypercholesterolemia, unspecified: Secondary | ICD-10-CM

## 2015-03-06 DIAGNOSIS — I10 Essential (primary) hypertension: Secondary | ICD-10-CM

## 2015-03-06 DIAGNOSIS — R351 Nocturia: Secondary | ICD-10-CM | POA: Diagnosis not present

## 2015-03-06 DIAGNOSIS — I872 Venous insufficiency (chronic) (peripheral): Secondary | ICD-10-CM

## 2015-03-06 DIAGNOSIS — G4733 Obstructive sleep apnea (adult) (pediatric): Secondary | ICD-10-CM

## 2015-03-06 LAB — URINALYSIS, ROUTINE W REFLEX MICROSCOPIC
Bilirubin Urine: NEGATIVE
Hgb urine dipstick: NEGATIVE
KETONES UR: NEGATIVE
Leukocytes, UA: NEGATIVE
Nitrite: NEGATIVE
PH: 6 (ref 5.0–8.0)
RBC / HPF: NONE SEEN (ref 0–?)
SPECIFIC GRAVITY, URINE: 1.02 (ref 1.000–1.030)
TOTAL PROTEIN, URINE-UPE24: NEGATIVE
Urine Glucose: NEGATIVE
Urobilinogen, UA: 0.2 (ref 0.0–1.0)
WBC UA: NONE SEEN (ref 0–?)

## 2015-03-06 NOTE — Addendum Note (Signed)
Addended by: Karlene Einstein D on: 03/06/2015 11:00 AM   Modules accepted: Orders

## 2015-03-06 NOTE — Assessment & Plan Note (Signed)
Last fasting lipid profile revealed slightly decreased HDL, otherwise ok.  Continue lovastatin.   Lab Results  Component Value Date   CHOL 112 12/02/2014   HDL 32.50* 12/02/2014   LDLCALC 60 12/02/2014   TRIG 96.0 12/02/2014   CHOLHDL 3 12/02/2014

## 2015-03-06 NOTE — Assessment & Plan Note (Signed)
Has a history of kidney stones.  States did see urology previously.  Last CT negative.  No back pain now.  Given intermittent episodes of dark urine - check urinalysis.

## 2015-03-06 NOTE — Progress Notes (Signed)
Patient ID: Michael Blevins, male   DOB: [birth year], 64 y.o.   MRN: [id number]   Subjective:    Patient ID: Michael Blevins, male    DOB: Jan 12, 1951, 64 y.o.   MRN: [id number]  HPI  Patient with past history of afib, hypertension and hypercholesterolemia.  He comes in today to follow up on these issues as well as for a complete physical exam.  He is planning to retire in December.  Wife is going to retire as well.  Increased stress with work.  Does not feel needs any further intervention.  Does wake up in the middle of the night.  Feels rested in am.  Still needs new machine.  Company we send info to - not covered.  No chest pain or tightness.  No increased heart rate or palpitations.  No sob.  No abdominal pain or cramping.  Intermittently will notice urine being dark and some minimal low back pain.  This resolves with increased fluid intake.  No bowel change.  No blood in urine.     Past Medical History  Diagnosis Date  . Hypertension   . Kidney stone   . Venous insufficiency   . Back pain   . Atrial fibrillation   . Hyperlipidemia   . Sleep apnea   . Allergy     cats and pollen  . Asthma   . History of chicken pox   . Cellulitis    Past Surgical History  Procedure Laterality Date  . Cardiac catheterization    . Appendectomy     Family History  Problem Relation Age of Onset  . Hypertension Mother   . Diabetes Father    Social History   Social History  . Marital Status: Married    Spouse Name: N/A  . Number of Children: N/A  . Years of Education: N/A   Social History Main Topics  . Smoking status: Former Research scientist (life sciences)  . Smokeless tobacco: Never Used  . Alcohol Use: No  . Drug Use: No  . Sexual Activity: Not Asked   Other Topics Concern  . None   Social History Narrative    Outpatient Encounter Prescriptions as of 03/06/2015  Medication Sig  . amLODipine (NORVASC) 10 MG tablet Take 10 mg by mouth daily.  . digoxin (LANOXIN) 0.125 MG tablet Take 0.125 mg by mouth  daily.  . furosemide (LASIX) 20 MG tablet Take 20 mg by mouth daily.  Marland Kitchen lisinopril (PRINIVIL,ZESTRIL) 20 MG tablet Take 20 mg by mouth daily.  Marland Kitchen lovastatin (MEVACOR) 20 MG tablet Take 20 mg by mouth at bedtime.  . metoprolol (LOPRESSOR) 50 MG tablet Take 50 mg by mouth 2 (two) times daily.   Marland Kitchen warfarin (COUMADIN) 2 MG tablet Take 2 mg by mouth daily.  Marland Kitchen warfarin (COUMADIN) 5 MG tablet Take 5 mg by mouth daily.   No facility-administered encounter medications on file as of 03/06/2015.    Review of Systems  Constitutional: Negative for appetite change and unexpected weight change.  HENT: Negative for congestion and sinus pressure.   Eyes: Negative for pain and visual disturbance.  Respiratory: Negative for cough, chest tightness and shortness of breath.   Cardiovascular: Negative for chest pain, palpitations and leg swelling (no increased swelling.  no erythema.  ).  Gastrointestinal: Negative for nausea, vomiting, abdominal pain and diarrhea.  Genitourinary: Negative for dysuria and difficulty urinating.       Nocturia - 1x.    Musculoskeletal: Negative for back pain (none now.  occasional  low back pain as outlined.  ) and joint swelling.  Skin: Negative for color change and rash.  Neurological: Negative for dizziness, light-headedness and headaches.  Hematological: Negative for adenopathy. Does not bruise/bleed easily.  Psychiatric/Behavioral: Negative for dysphoric mood and agitation.       Objective:     Blood pressure rechecked by me:  138-140/82 right and 134/78 left.    Physical Exam  Constitutional: He is oriented to person, place, and time. He appears well-developed and well-nourished. No distress.  HENT:  Head: Normocephalic and atraumatic.  Nose: Nose normal.  Mouth/Throat: Oropharynx is clear and moist. No oropharyngeal exudate.  Eyes: Conjunctivae are normal. Right eye exhibits no discharge. Left eye exhibits no discharge.  Neck: Neck supple. No thyromegaly present.    Cardiovascular: Normal rate.   Irregular rhythm.    Pulmonary/Chest: Breath sounds normal. No respiratory distress. He has no wheezes.  Abdominal: Soft. Bowel sounds are normal. There is no tenderness.  Genitourinary:  Rectal exam reveal palpable prostate.  No palpable nodule.  Heme negative.    Musculoskeletal: He exhibits no tenderness.  No increased edema.  DP pulses palpable and equal bilaterally.    Lymphadenopathy:    He has no cervical adenopathy.  Neurological: He is alert and oriented to person, place, and time.  Skin: Skin is warm and dry. No rash noted. No erythema.  Psychiatric: He has a normal mood and affect. His behavior is normal.    BP 138/100 mmHg  Pulse 85  Temp(Src) 98.2 F (36.8 C) (Oral)  Resp 18  Ht 5' 11.5" (1.816 m)  Wt 299 lb 6 oz (135.796 kg)  BMI 41.18 kg/m2  SpO2 97% Wt Readings from Last 3 Encounters:  03/06/15 299 lb 6 oz (135.796 kg)  11/02/14 296 lb (134.265 kg)  07/22/14 295 lb 2 oz (133.868 kg)     Lab Results  Component Value Date   WBC 9.5 12/02/2014   HGB 16.6 12/02/2014   HCT 49.0 12/02/2014   PLT 183.0 12/02/2014   GLUCOSE 99 12/02/2014   CHOL 112 12/02/2014   TRIG 96.0 12/02/2014   HDL 32.50* 12/02/2014   LDLCALC 60 12/02/2014   ALT 26 12/02/2014   AST 25 12/02/2014   NA 141 12/02/2014   K 4.1 12/02/2014   CL 105 12/02/2014   CREATININE 0.96 12/02/2014   BUN 14 12/02/2014   CO2 31 12/02/2014   TSH 1.67 08/05/2014   PSA 2.81 08/05/2014   INR 2.7* 02/09/2014       Assessment & Plan:   Problem List Items Addressed This Visit    Atrial fibrillation    Followed by Dr Saralyn Pilar.  On coumadin.  Stable.  Rate controlled.       Essential hypertension    Blood pressure as outlined.  Recheck improved.  Continue same medication regimen.  Follow pressures.  Follow metabolic panel.        Health care maintenance    Physical today 03/06/15.  PSA 08/05/14 - 2.81.  He states had colonoscopy in [place] approximately five  years ago.  Will obtain copy of results.        Hypercholesterolemia    Last fasting lipid profile revealed slightly decreased HDL, otherwise ok.  Continue lovastatin.   Lab Results  Component Value Date   CHOL 112 12/02/2014   HDL 32.50* 12/02/2014   LDLCALC 60 12/02/2014   TRIG 96.0 12/02/2014   CHOLHDL 3 12/02/2014        Nephrolithiasis  Has a history of kidney stones.  States did see urology previously.  Last CT negative.  No back pain now.  Given intermittent episodes of dark urine - check urinalysis.        Obstructive sleep apnea    Still needs a new machine.  Sleep study sent to wrong place per pt.  We send to [facility].  Information given to nurse to fax.        Venous insufficiency    Continue compression hose.  No increased edema today.        Other Visit Diagnoses    Nocturia    -  Primary    Relevant Orders    Urinalysis, Routine w reflex microscopic (not at [facility])    CULTURE, URINE COMPREHENSIVE        Einar Pheasant, MD

## 2015-03-06 NOTE — Progress Notes (Signed)
Pre-visit discussion using our clinic review tool. No additional management support is needed unless otherwise documented below in the visit note.  

## 2015-03-06 NOTE — Assessment & Plan Note (Signed)
Still needs a new machine.  Sleep study sent to wrong place per pt.  We send to [facility].  Information given to nurse to fax.

## 2015-03-06 NOTE — Assessment & Plan Note (Signed)
Continue compression hose.  No increased edema today.

## 2015-03-06 NOTE — Assessment & Plan Note (Signed)
Blood pressure as outlined.  Recheck improved.  Continue same medication regimen.  Follow pressures.  Follow metabolic panel.  

## 2015-03-06 NOTE — Assessment & Plan Note (Addendum)
Physical today 03/06/15.  PSA 08/05/14 - 2.81.  He states had colonoscopy in [place] approximately five years ago.  Will obtain copy of results.

## 2015-03-06 NOTE — Assessment & Plan Note (Signed)
Followed by Dr Saralyn Pilar.  On coumadin.  Stable.  Rate controlled.

## 2015-03-07 LAB — CULTURE, URINE COMPREHENSIVE
Colony Count: NO GROWTH
Organism ID, Bacteria: NO GROWTH

## 2015-03-08 ENCOUNTER — Encounter: Payer: Self-pay | Admitting: Internal Medicine

## 2015-03-13 NOTE — Telephone Encounter (Signed)
Unread mychart message mailed to patient 

## 2015-03-20 ENCOUNTER — Encounter: Payer: Self-pay | Admitting: *Deleted

## 2015-06-21 DIAGNOSIS — I482 Chronic atrial fibrillation: Secondary | ICD-10-CM | POA: Diagnosis not present

## 2015-07-03 ENCOUNTER — Encounter: Payer: Self-pay | Admitting: Internal Medicine

## 2015-07-06 ENCOUNTER — Ambulatory Visit: Payer: 59 | Admitting: Internal Medicine

## 2015-07-10 ENCOUNTER — Encounter: Payer: Self-pay | Admitting: Internal Medicine

## 2015-07-11 DIAGNOSIS — R7301 Impaired fasting glucose: Secondary | ICD-10-CM | POA: Insufficient documentation

## 2015-07-11 DIAGNOSIS — E785 Hyperlipidemia, unspecified: Secondary | ICD-10-CM | POA: Insufficient documentation

## 2015-07-11 DIAGNOSIS — N200 Calculus of kidney: Secondary | ICD-10-CM | POA: Insufficient documentation

## 2015-07-11 DIAGNOSIS — I10 Essential (primary) hypertension: Secondary | ICD-10-CM | POA: Insufficient documentation

## 2015-07-19 DIAGNOSIS — I482 Chronic atrial fibrillation: Secondary | ICD-10-CM | POA: Diagnosis not present

## 2015-08-16 DIAGNOSIS — I482 Chronic atrial fibrillation: Secondary | ICD-10-CM | POA: Diagnosis not present

## 2015-09-18 DIAGNOSIS — I482 Chronic atrial fibrillation: Secondary | ICD-10-CM | POA: Diagnosis not present

## 2015-09-27 ENCOUNTER — Telehealth: Payer: Self-pay | Admitting: *Deleted

## 2015-09-27 NOTE — Telephone Encounter (Signed)
Please advise a place on Dr. Lars Mage schedule to place patient for a follow up 69min

## 2015-09-28 NOTE — Telephone Encounter (Signed)
I can see him at 4:00 on 10/17/15.

## 2015-09-29 ENCOUNTER — Ambulatory Visit: Payer: 59 | Admitting: Internal Medicine

## 2015-10-16 DIAGNOSIS — I482 Chronic atrial fibrillation: Secondary | ICD-10-CM | POA: Diagnosis not present

## 2015-10-17 ENCOUNTER — Ambulatory Visit: Payer: 59 | Admitting: Internal Medicine

## 2015-10-25 ENCOUNTER — Ambulatory Visit (INDEPENDENT_AMBULATORY_CARE_PROVIDER_SITE_OTHER): Payer: Medicare Other | Admitting: Internal Medicine

## 2015-10-25 ENCOUNTER — Encounter: Payer: Self-pay | Admitting: Internal Medicine

## 2015-10-25 VITALS — BP 120/90 | HR 80 | Temp 98.1°F | Resp 18 | Ht 71.5 in | Wt 289.0 lb

## 2015-10-25 DIAGNOSIS — Z125 Encounter for screening for malignant neoplasm of prostate: Secondary | ICD-10-CM

## 2015-10-25 DIAGNOSIS — G4733 Obstructive sleep apnea (adult) (pediatric): Secondary | ICD-10-CM

## 2015-10-25 DIAGNOSIS — I482 Chronic atrial fibrillation, unspecified: Secondary | ICD-10-CM

## 2015-10-25 DIAGNOSIS — Z1211 Encounter for screening for malignant neoplasm of colon: Secondary | ICD-10-CM | POA: Diagnosis not present

## 2015-10-25 DIAGNOSIS — R319 Hematuria, unspecified: Secondary | ICD-10-CM

## 2015-10-25 DIAGNOSIS — N200 Calculus of kidney: Secondary | ICD-10-CM

## 2015-10-25 DIAGNOSIS — I10 Essential (primary) hypertension: Secondary | ICD-10-CM

## 2015-10-25 DIAGNOSIS — E7800 Pure hypercholesterolemia, unspecified: Secondary | ICD-10-CM

## 2015-10-25 DIAGNOSIS — I872 Venous insufficiency (chronic) (peripheral): Secondary | ICD-10-CM

## 2015-10-25 NOTE — Progress Notes (Signed)
Pre-visit discussion using our clinic review tool. No additional management support is needed unless otherwise documented below in the visit note.  

## 2015-10-25 NOTE — Progress Notes (Signed)
Patient ID: Michael Blevins, male   DOB: 02-11-1951, 65 y.o.   MRN: [id number]   Subjective:    Patient ID: Michael Blevins, male    DOB: 19-Aug-1950, 65 y.o.   MRN: [id number]  HPI  Patient here for a scheduled follow up.  States he is doing relatively well.  Tries to stay active.  No cardiac symptoms with increased activity or exertion.  No sob.  No acid reflux.  No abdominal pain or cramping.  Bowels stable.  Had his colonoscopy 2011.  States due f/u in 10 years.  Recently  notice hematuria.  Passed clots.  Lasted 3-4 days and then resolved.  This has happened previously.     Past Medical History  Diagnosis Date  . Hypertension   . Kidney stone   . Venous insufficiency   . Back pain   . Atrial fibrillation ([place])   . Hyperlipidemia   . Sleep apnea   . Allergy     cats and pollen  . Asthma   . History of chicken pox   . Cellulitis    Past Surgical History  Procedure Laterality Date  . Cardiac catheterization    . Appendectomy     Family History  Problem Relation Age of Onset  . Hypertension Mother   . Diabetes Father    Social History   Social History  . Marital Status: Married    Spouse Name: N/A  . Number of Children: N/A  . Years of Education: N/A   Social History Main Topics  . Smoking status: Former Research scientist (life sciences)  . Smokeless tobacco: Never Used  . Alcohol Use: No  . Drug Use: No  . Sexual Activity: Not Asked   Other Topics Concern  . None   Social History Narrative    Outpatient Encounter Prescriptions as of 10/25/2015  Medication Sig  . amLODipine (NORVASC) 10 MG tablet Take 10 mg by mouth daily.  . digoxin (LANOXIN) 0.125 MG tablet Take 0.125 mg by mouth daily.  . furosemide (LASIX) 20 MG tablet Take 20 mg by mouth daily.  Marland Kitchen lisinopril (PRINIVIL,ZESTRIL) 20 MG tablet Take 20 mg by mouth daily.  Marland Kitchen lovastatin (MEVACOR) 20 MG tablet Take 20 mg by mouth at bedtime.  . metoprolol (LOPRESSOR) 50 MG tablet Take 50 mg by mouth 2 (two) times daily.   Marland Kitchen  warfarin (COUMADIN) 2 MG tablet Take 2 mg by mouth daily.  Marland Kitchen warfarin (COUMADIN) 5 MG tablet Take 5 mg by mouth daily.   No facility-administered encounter medications on file as of 10/25/2015.    Review of Systems  Constitutional: Negative for appetite change and unexpected weight change.  HENT: Negative for congestion and sinus pressure.   Respiratory: Negative for cough, chest tightness and shortness of breath.   Cardiovascular: Negative for chest pain, palpitations and leg swelling.  Gastrointestinal: Negative for nausea, vomiting, abdominal pain and diarrhea.  Genitourinary: Positive for hematuria. Negative for dysuria.  Musculoskeletal: Negative for back pain and joint swelling.  Skin: Negative for color change and rash.  Neurological: Negative for dizziness, light-headedness and headaches.  Psychiatric/Behavioral: Negative for dysphoric mood and agitation.       Objective:    Physical Exam  Constitutional: He appears well-developed and well-nourished. No distress.  HENT:  Nose: Nose normal.  Mouth/Throat: Oropharynx is clear and moist.  Neck: Neck supple. No thyromegaly present.  Cardiovascular: Normal rate and regular rhythm.   Pulmonary/Chest: Effort normal and breath sounds normal. No respiratory distress.  Abdominal: Soft. Bowel  sounds are normal. There is no tenderness.  Musculoskeletal: He exhibits no edema or tenderness.  Lymphadenopathy:    He has no cervical adenopathy.  Skin: No rash noted. No erythema.  Psychiatric: He has a normal mood and affect. His behavior is normal.    BP 120/90 mmHg  Pulse 80  Temp(Src) 98.1 F (36.7 C) (Oral)  Resp 18  Ht 5' 11.5" (1.816 m)  Wt 289 lb (131.09 kg)  BMI 39.75 kg/m2  SpO2 95% Wt Readings from Last 3 Encounters:  10/25/15 289 lb (131.09 kg)  03/06/15 299 lb 6 oz (135.796 kg)  11/02/14 296 lb (134.265 kg)     Lab Results  Component Value Date   WBC 9.5 12/02/2014   HGB 16.6 12/02/2014   HCT 49.0 12/02/2014    PLT 183.0 12/02/2014   GLUCOSE 99 12/02/2014   CHOL 112 12/02/2014   TRIG 96.0 12/02/2014   HDL 32.50* 12/02/2014   LDLCALC 60 12/02/2014   ALT 26 12/02/2014   AST 25 12/02/2014   NA 141 12/02/2014   K 4.1 12/02/2014   CL 105 12/02/2014   CREATININE 0.96 12/02/2014   BUN 14 12/02/2014   CO2 31 12/02/2014   TSH 1.67 08/05/2014   PSA 2.81 08/05/2014   INR 2.7* 02/09/2014        Assessment & Plan:   Problem List Items Addressed This Visit    Atrial fibrillation ([place])    Followed by Dr Saralyn Pilar.  Stable.  On coumadin.        Blood in the urine    Previously worked up.  Urine cytology negative.  RBCs seen on urine cytology.  Normal renal ultrasound 10/2010.  Refer back to urology for evaluation of painless hematuria.        Essential hypertension    Blood pressure on recheck better.  Same medication regimen.  Have him spot check his pressure.  Notify me if persistent elevation.  Follow metabolic panel.        Relevant Orders   TSH   Basic metabolic panel   Hypercholesterolemia    On lovastatin.  Low cholesterol diet and exercise.  Follow lipid panel and liver function tests.        Relevant Orders   Lipid panel   Hepatic function panel   Nephrolithiasis    Has a history of kidney stones.  Saw urology previously.  Last CT negative.  Has noticed recent hematuria.  Passing clots.  No pain.  Refer back to urology for w/up of painless hematuria.        Obstructive sleep apnea    CPAP      Venous insufficiency    Compression hose.  Follow.        Other Visit Diagnoses    Hematuria    -  Primary    Relevant Orders    Ambulatory referral to Urology    Urinalysis, Routine w reflex microscopic (not at [facility]) (Completed)    CBC with Differential/Platelet    Colon cancer screening        Relevant Orders    Fecal occult blood, imunochemical    Prostate cancer screening        Relevant Orders    PSA        Einar Pheasant, MD

## 2015-10-26 LAB — URINALYSIS, ROUTINE W REFLEX MICROSCOPIC
Bilirubin Urine: NEGATIVE
HGB URINE DIPSTICK: NEGATIVE
Leukocytes, UA: NEGATIVE
Nitrite: NEGATIVE
RBC / HPF: NONE SEEN (ref 0–?)
Specific Gravity, Urine: 1.015 (ref 1.000–1.030)
URINE GLUCOSE: NEGATIVE
UROBILINOGEN UA: 0.2 (ref 0.0–1.0)
pH: 5.5 (ref 5.0–8.0)

## 2015-10-27 ENCOUNTER — Encounter: Payer: Self-pay | Admitting: Internal Medicine

## 2015-11-06 ENCOUNTER — Encounter: Payer: Self-pay | Admitting: Internal Medicine

## 2015-11-06 NOTE — Assessment & Plan Note (Signed)
Has a history of kidney stones.  Saw urology previously.  Last CT negative.  Has noticed recent hematuria.  Passing clots.  No pain.  Refer back to urology for w/up of painless hematuria.

## 2015-11-06 NOTE — Assessment & Plan Note (Signed)
Compression hose.  Follow.   

## 2015-11-06 NOTE — Assessment & Plan Note (Signed)
Blood pressure on recheck better.  Same medication regimen.  Have him spot check his pressure.  Notify me if persistent elevation.  Follow metabolic panel.

## 2015-11-06 NOTE — Assessment & Plan Note (Signed)
Previously worked up.  Urine cytology negative.  RBCs seen on urine cytology.  Normal renal ultrasound 10/2010.  Refer back to urology for evaluation of painless hematuria.

## 2015-11-06 NOTE — Assessment & Plan Note (Signed)
On lovastatin.  Low cholesterol diet and exercise.  Follow lipid panel and liver function tests.   

## 2015-11-06 NOTE — Assessment & Plan Note (Signed)
Followed by Dr Saralyn Pilar.  Stable.  On coumadin.

## 2015-11-06 NOTE — Assessment & Plan Note (Signed)
CPAP.  

## 2015-11-07 ENCOUNTER — Other Ambulatory Visit (INDEPENDENT_AMBULATORY_CARE_PROVIDER_SITE_OTHER): Payer: Medicare Other

## 2015-11-07 DIAGNOSIS — Z1211 Encounter for screening for malignant neoplasm of colon: Secondary | ICD-10-CM

## 2015-11-07 LAB — FECAL OCCULT BLOOD, IMMUNOCHEMICAL: Fecal Occult Bld: NEGATIVE

## 2015-11-08 ENCOUNTER — Encounter: Payer: Self-pay | Admitting: Internal Medicine

## 2015-11-08 ENCOUNTER — Other Ambulatory Visit (INDEPENDENT_AMBULATORY_CARE_PROVIDER_SITE_OTHER): Payer: Medicare Other

## 2015-11-08 ENCOUNTER — Other Ambulatory Visit: Payer: No Typology Code available for payment source

## 2015-11-08 DIAGNOSIS — I10 Essential (primary) hypertension: Secondary | ICD-10-CM

## 2015-11-08 DIAGNOSIS — G4733 Obstructive sleep apnea (adult) (pediatric): Secondary | ICD-10-CM | POA: Diagnosis not present

## 2015-11-08 DIAGNOSIS — I480 Paroxysmal atrial fibrillation: Secondary | ICD-10-CM | POA: Diagnosis not present

## 2015-11-08 DIAGNOSIS — Z125 Encounter for screening for malignant neoplasm of prostate: Secondary | ICD-10-CM | POA: Diagnosis not present

## 2015-11-08 DIAGNOSIS — Z9889 Other specified postprocedural states: Secondary | ICD-10-CM | POA: Diagnosis not present

## 2015-11-08 DIAGNOSIS — I872 Venous insufficiency (chronic) (peripheral): Secondary | ICD-10-CM | POA: Diagnosis not present

## 2015-11-08 DIAGNOSIS — R319 Hematuria, unspecified: Secondary | ICD-10-CM

## 2015-11-08 DIAGNOSIS — E7800 Pure hypercholesterolemia, unspecified: Secondary | ICD-10-CM | POA: Diagnosis not present

## 2015-11-08 LAB — CBC WITH DIFFERENTIAL/PLATELET
BASOS PCT: 0.4 % (ref 0.0–3.0)
Basophils Absolute: 0 10*3/uL (ref 0.0–0.1)
Eosinophils Absolute: 0.2 10*3/uL (ref 0.0–0.7)
Eosinophils Relative: 2.2 % (ref 0.0–5.0)
HEMATOCRIT: 48.7 % (ref 39.0–52.0)
Hemoglobin: 16.4 g/dL (ref 13.0–17.0)
LYMPHS ABS: 2.6 10*3/uL (ref 0.7–4.0)
LYMPHS PCT: 23.4 % (ref 12.0–46.0)
MCHC: 33.6 g/dL (ref 30.0–36.0)
MCV: 86.4 fl (ref 78.0–100.0)
MONOS PCT: 7.2 % (ref 3.0–12.0)
Monocytes Absolute: 0.8 10*3/uL (ref 0.1–1.0)
NEUTROS ABS: 7.5 10*3/uL (ref 1.4–7.7)
NEUTROS PCT: 66.8 % (ref 43.0–77.0)
PLATELETS: 217 10*3/uL (ref 150.0–400.0)
RBC: 5.63 Mil/uL (ref 4.22–5.81)
RDW: 13.4 % (ref 11.5–15.5)
WBC: 11.2 10*3/uL — ABNORMAL HIGH (ref 4.0–10.5)

## 2015-11-08 LAB — HEPATIC FUNCTION PANEL
ALT: 26 U/L (ref 0–53)
AST: 26 U/L (ref 0–37)
Albumin: 4.1 g/dL (ref 3.5–5.2)
Alkaline Phosphatase: 68 U/L (ref 39–117)
BILIRUBIN DIRECT: 0.2 mg/dL (ref 0.0–0.3)
BILIRUBIN TOTAL: 1.1 mg/dL (ref 0.2–1.2)
TOTAL PROTEIN: 6.3 g/dL (ref 6.0–8.3)

## 2015-11-08 LAB — LIPID PANEL
CHOL/HDL RATIO: 4
Cholesterol: 120 mg/dL (ref 0–200)
HDL: 30.4 mg/dL — ABNORMAL LOW (ref >39.00)
LDL CALC: 67 mg/dL (ref 0–99)
NONHDL: 89.31
Triglycerides: 113 mg/dL (ref 0.0–149.0)
VLDL: 22.6 mg/dL (ref 0.0–40.0)

## 2015-11-08 LAB — TSH: TSH: 2.45 u[IU]/mL (ref 0.35–4.50)

## 2015-11-08 LAB — PSA: PSA: 3.49 ng/mL (ref 0.10–4.00)

## 2015-11-08 LAB — BASIC METABOLIC PANEL
BUN: 15 mg/dL (ref 6–23)
CHLORIDE: 106 meq/L (ref 96–112)
CO2: 29 meq/L (ref 19–32)
Calcium: 9.1 mg/dL (ref 8.4–10.5)
Creatinine, Ser: 0.88 mg/dL (ref 0.40–1.50)
GFR: 92.22 mL/min (ref >60.00)
GLUCOSE: 82 mg/dL (ref 70–99)
POTASSIUM: 4.3 meq/L (ref 3.5–5.1)
Sodium: 140 mEq/L (ref 135–145)

## 2015-11-09 ENCOUNTER — Other Ambulatory Visit: Payer: Self-pay | Admitting: Internal Medicine

## 2015-11-09 DIAGNOSIS — D72829 Elevated white blood cell count, unspecified: Secondary | ICD-10-CM

## 2015-11-09 NOTE — Progress Notes (Signed)
Order placed for f/u lab.   

## 2015-11-13 ENCOUNTER — Encounter: Payer: Self-pay | Admitting: Urology

## 2015-11-13 ENCOUNTER — Ambulatory Visit (INDEPENDENT_AMBULATORY_CARE_PROVIDER_SITE_OTHER): Payer: Medicare Other | Admitting: Urology

## 2015-11-13 VITALS — BP 155/102 | HR 65 | Ht 72.0 in | Wt 287.2 lb

## 2015-11-13 DIAGNOSIS — R310 Gross hematuria: Secondary | ICD-10-CM | POA: Diagnosis not present

## 2015-11-13 DIAGNOSIS — N401 Enlarged prostate with lower urinary tract symptoms: Secondary | ICD-10-CM | POA: Diagnosis not present

## 2015-11-13 DIAGNOSIS — R3129 Other microscopic hematuria: Secondary | ICD-10-CM | POA: Diagnosis not present

## 2015-11-13 DIAGNOSIS — N138 Other obstructive and reflux uropathy: Secondary | ICD-10-CM

## 2015-11-13 DIAGNOSIS — I482 Chronic atrial fibrillation: Secondary | ICD-10-CM | POA: Diagnosis not present

## 2015-11-13 MED ORDER — FINASTERIDE 5 MG PO TABS: 5.0000 mg | ORAL_TABLET | Freq: Every day | ORAL | Status: DC

## 2015-11-13 NOTE — Progress Notes (Signed)
11/13/2015 2:10 PM   Michael Blevins 11/14/1950 [id number]  Referring provider: Einar Pheasant, MD [id number] [place], [place] [address]  Chief Complaint  Patient presents with  . Hematuria    referred by Dr. Nicki Reaper    HPI: Patient is a 65 -year-old Caucasian male who presents today as a referral from their PCP, Dr. Nicki Reaper, for gross hematuria.  Patient had two episodes of painless gross hematuria 18 months apart.   Patient doesn't have a prior history of  hematuria.    He possibly has a prior history of nephrolithiasis, as he states he feels he passes a stone every other year.  There has been no stone fragments captured or imaging studies to support this claim.  He does not have a prior history of recurrent urinary tract infections, trauma to the genitourinary tract, BPH or malignancies of the genitourinary tract.   He does not have a family medical history of nephrolithiasis, malignancies of the genitourinary tract or hematuria.   Today, he is having symptoms of frequent urination, nocturia, hesitancy, intermittency or a weak urinary stream.  He denies urgency, dysuria, incontinence and straining to urinate.   His UA today demonstrates 0-2.  He is not experiencing any suprapubic pain, abdominal pain or flank pain. He denies any recent fevers, chills, nausea or vomiting.   He had a CT abdomen and pelvis without contrast in 2013 which noted enlargement of the prostate gland.    He is a former smoker, with a 1 ppd history for 30 years.  Quit 16 years ago.  He is not exposed to secondhand smoke.  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    PMH: Past Medical History  Diagnosis Date  . Hypertension   . Kidney stone   . Venous insufficiency   . Back pain   . Atrial fibrillation ([place])   . Hyperlipidemia   . Sleep apnea   . Allergy     cats and pollen  . Asthma   . History of chicken pox   . Cellulitis   . Gout     Surgical History: Past  Surgical History  Procedure Laterality Date  . Cardiac catheterization    . Appendectomy      Home Medications:    Medication List       This list is accurate as of: 11/13/15  2:10 PM.  Always use your most recent med list.               amLODipine 10 MG tablet  Commonly known as:  NORVASC  Take 10 mg by mouth daily.     digoxin 0.125 MG tablet  Commonly known as:  LANOXIN  Take 0.125 mg by mouth daily.     finasteride 5 MG tablet  Commonly known as:  PROSCAR  Take 1 tablet (5 mg total) by mouth daily.     furosemide 20 MG tablet  Commonly known as:  LASIX  Take 20 mg by mouth daily.     lisinopril 20 MG tablet  Commonly known as:  PRINIVIL,ZESTRIL  Take 20 mg by mouth daily.     lovastatin 20 MG tablet  Commonly known as:  MEVACOR  Take 20 mg by mouth at bedtime.     metoprolol 50 MG tablet  Commonly known as:  LOPRESSOR  Take 50 mg by mouth 2 (two) times daily.     warfarin 2 MG tablet  Commonly known as:  COUMADIN  Take 2 mg  by mouth daily.     warfarin 5 MG tablet  Commonly known as:  COUMADIN  Take 5 mg by mouth daily.        Allergies: No Known Allergies  Family History: Family History  Problem Relation Age of Onset  . Hypertension Mother   . Diabetes Father   . Kidney disease Neg Hx   . Prostate cancer Neg Hx     Social History:  reports that he has quit smoking. He has never used smokeless tobacco. He reports that he does not drink alcohol or use illicit drugs.  ROS: UROLOGY Frequent Urination?: Yes Hard to postpone urination?: No Burning/pain with urination?: No Get up at night to urinate?: Yes Leakage of urine?: No Urine stream starts and stops?: Yes Trouble starting stream?: Yes Do you have to strain to urinate?: No Blood in urine?: Yes Urinary tract infection?: No Sexually transmitted disease?: No Injury to kidneys or bladder?: No Painful intercourse?: No Weak stream?: Yes Erection problems?: No Penile pain?:  No  Gastrointestinal Nausea?: No Vomiting?: No Indigestion/heartburn?: No Diarrhea?: No Constipation?: No  Constitutional Fever: No Night sweats?: Yes Weight loss?: No Fatigue?: No  Skin Skin rash/lesions?: No Itching?: No  Eyes Blurred vision?: No Double vision?: No  Ears/Nose/Throat Sore throat?: No Sinus problems?: No  Hematologic/Lymphatic Swollen glands?: No Easy bruising?: No  Cardiovascular Leg swelling?: No Chest pain?: No  Respiratory Cough?: No Shortness of breath?: No  Endocrine Excessive thirst?: No  Musculoskeletal Back pain?: No Joint pain?: No  Neurological Headaches?: No Dizziness?: No  Psychologic Depression?: No Anxiety?: No  Physical Exam: BP 155/102 mmHg  Pulse 65  Ht 6' (1.829 m)  Wt 287 lb 3.2 oz (130.273 kg)  BMI 38.94 kg/m2  Constitutional: Well nourished. Alert and oriented, No acute distress. HEENT: [place] AT, moist mucus membranes. Trachea midline, no masses. Cardiovascular: No clubbing, cyanosis, or edema. Respiratory: Normal respiratory effort, no increased work of breathing. GI: Abdomen is soft, non tender, non distended, no abdominal masses. Liver and spleen not palpable.  No hernias appreciated.  Stool sample for occult testing is not indicated.   GU: No CVA tenderness.  No bladder fullness or masses.  Patient with circumcised phallus.   Urethral meatus is patent.  No penile discharge. No penile lesions or rashes. Scrotum without lesions, cysts, rashes and/or edema.  Testicles are located scrotally bilaterally. No masses are appreciated in the testicles. Left and right epididymis are normal. Rectal: Patient with  normal sphincter tone. Anus and perineum without scarring or rashes. No rectal masses are appreciated. Prostate is approximately 60 + grams, could not palpate the entire gland, no nodules are appreciated. Seminal vesicles could not be palpated.   Skin: No rashes, bruises or suspicious lesions. Lymph: No  cervical or inguinal adenopathy. Neurologic: Grossly intact, no focal deficits, moving all 4 extremities. Psychiatric: Normal mood and affect.  Laboratory Data: Lab Results  Component Value Date   WBC 11.2* 11/08/2015   HGB 16.4 11/08/2015   HCT 48.7 11/08/2015   MCV 86.4 11/08/2015   PLT 217.0 11/08/2015    Lab Results  Component Value Date   CREATININE 0.88 11/08/2015    Lab Results  Component Value Date   PSA 3.49 11/08/2015   PSA 2.81 08/05/2014     Lab Results  Component Value Date   TSH 2.45 11/08/2015       Component Value Date/Time   CHOL 120 11/08/2015 0818   HDL 30.40* 11/08/2015 0818   CHOLHDL 4 11/08/2015 0818  VLDL 22.6 11/08/2015 0818   LDLCALC 67 11/08/2015 0818    Lab Results  Component Value Date   AST 26 11/08/2015   Lab Results  Component Value Date   ALT 26 11/08/2015     Urinalysis Results for orders placed or performed in visit on 11/13/15  Microscopic Examination  Result Value Ref Range   WBC, UA None seen 0 -  5 /hpf   RBC, UA 0-2 0 -  2 /hpf   Epithelial Cells (non renal) None seen 0 - 10 /hpf   Bacteria, UA None seen None seen/Few  Urinalysis, Complete  Result Value Ref Range   Specific Gravity, UA <1.005 (L) 1.005 - 1.030   pH, UA 6.0 5.0 - 7.5   Color, UA Yellow Yellow   Appearance Ur Clear Clear   Leukocytes, UA Negative Negative   Protein, UA Negative Negative/Trace   Glucose, UA Negative Negative   Ketones, UA Negative Negative   RBC, UA Trace (A) Negative   Bilirubin, UA Negative Negative   Urobilinogen, Ur 0.2 0.2 - 1.0 mg/dL   Nitrite, UA Negative Negative   Microscopic Examination See below:   BUN+Creat  Result Value Ref Range   BUN 16 8 - 27 mg/dL   Creatinine, Ser 0.87 0.76 - 1.27 mg/dL   GFR calc non Af Amer 91 >59 mL/min/1.73   GFR calc Af Amer 105 >59 mL/min/1.73   BUN/Creatinine Ratio 18 10 - 24    Pertinent Imaging: PRIOR REPORT IMPORTED FROM THE [facility] WORKFLOW SYSTEM   REASON FOR  EXAM: hematuria  COMMENTS:   PROCEDURE: KCT - KCT ABDOMEN/PELVIS WO - Oct 07 2011 9:32AM   RESULT: Axial noncontrast CT scanning was performed through the  abdomen  and pelvis with reconstructions at 3 mm intervals and slice thicknesses.   The kidneys demonstrate normal density and contour. There is no evidence  of  hydronephrosis. I see no calcified stones. The perinephric fat is normal  in  density. There may be a subcentimeter cyst along the medial aspect of the  midpole of the right kidney on image 58. Along the expected course of the  ureters I see no abnormal calcifications. Within the pelvis there are  phleboliths. There is enlargement of the prostate gland which produces an  irregular impression upon the urinary bladder base. I do not see definite  stones at the ureterovesical junctions.   The liver, pancreas, spleen, and adrenal glands are normal in appearance.  There are tiny stones in the dependent portion of the gallbladder. The  partially distended stomach is grossly normal. The unopacified loops of  small and large bowel exhibit no evidence of ileus or obstruction or acute  inflammation. There is no inguinal nor umbilical hernia. The lung bases  are  clear.   IMPRESSION:  1. There is enlargement of the prostate gland which produces an irregular  impression upon the urinary bladder base. Cystoscopy may be of value.  2. I do not see evidence of urinary tract stones nor obstruction. On this  noncontrast study I do not see definite evidence of renal parenchymal  abnormality. If the patient's hematuria persists and remains unexplained,  a  contrast-enhanced CT scan through the kidneys with immediate and delayed  images would be useful.  3. There are gallstones present.   Dictation location one   Assessment & Plan:    1. Gross hematuria:    I explained to the patient that there are a number of causes that can be  associated with blood in the urine, such as stones, BPH, UTI's, damage to the urinary tract and/or cancer.  At this time, I felt that the patient warranted further urologic evaluation.   The AUA guidelines state that a CT urogram is the preferred imaging study to evaluate hematuria.  I explained to the patient that a contrast material will be injected into a vein and that in rare instances, an allergic reaction can result and may even life threatening   The patient denies any allergies to contrast, iodine and/or seafood and is not taking metformin.  Following the imaging study,  I've recommended a cystoscopy. I described how this is performed, typically in an office setting with a flexible cystoscope. We described the risks, benefits, and possible side effects, the most common of which is a minor amount of blood in the urine and/or burning which usually resolves in 24 to 48 hours.    The patient had the opportunity to ask questions which were answered. Based upon this discussion, the patient is willing to proceed. Therefore, I've ordered: a CT Urogram and cystoscopy.  He will return following all of the above for discussion of the results.     - Urinalysis, Complete - CULTURE, URINE COMPREHENSIVE - BUN+Creat  2. BPH with LUTS:   I discussed the treatment options for BPH, such as: observation over time with prn avoidance of alcohol/caffeine; medical treatment or TURP.  Patient like to pursue medical treatment.  I will start finasteride 5 mg daily.  The side effects of finasteride are also discussed with the patient, such as: impotence, loss of interest in sex, or trouble having an orgasm; abnormal ejaculation; swelling in his hands and/or feet; swelling and/or tenderness in his breasts.  He will follow up after his hematuria work up for IPSS.    Return for CT Urogram report and cystoscopy.  These notes generated with voice recognition software. I apologize for typographical errors.  Zara Council, [facility] [address], [place], [place] [address] [phone]

## 2015-11-14 ENCOUNTER — Encounter: Payer: Self-pay | Admitting: Internal Medicine

## 2015-11-14 LAB — URINALYSIS, COMPLETE
Bilirubin, UA: NEGATIVE
Glucose, UA: NEGATIVE
Ketones, UA: NEGATIVE
Leukocytes, UA: NEGATIVE
NITRITE UA: NEGATIVE
PH UA: 6 (ref 5.0–7.5)
Protein, UA: NEGATIVE
Specific Gravity, UA: <1.005 — ABNORMAL LOW (ref 1.005–1.030)
UUROB: 0.2 mg/dL (ref 0.2–1.0)

## 2015-11-14 LAB — BUN+CREAT
BUN/Creatinine Ratio: 18 (ref 10–24)
BUN: 16 mg/dL (ref 8–27)
CREATININE: 0.87 mg/dL (ref 0.76–1.27)
GFR, EST AFRICAN AMERICAN: 105 mL/min/{1.73_m2} (ref >59)
GFR, EST NON AFRICAN AMERICAN: 91 mL/min/{1.73_m2} (ref >59)

## 2015-11-14 LAB — MICROSCOPIC EXAMINATION
Bacteria, UA: NONE SEEN
Epithelial Cells (non renal): NONE SEEN /hpf (ref 0–10)
WBC, UA: NONE SEEN /hpf (ref 0–?)

## 2015-11-15 NOTE — Telephone Encounter (Signed)
Please call and confirm with pt this is something he has used and how often using.  (also confirm if having problems now and why is needed).  Thanks.

## 2015-11-17 LAB — CULTURE, URINE COMPREHENSIVE

## 2015-11-23 ENCOUNTER — Other Ambulatory Visit (INDEPENDENT_AMBULATORY_CARE_PROVIDER_SITE_OTHER): Payer: Medicare Other

## 2015-11-23 DIAGNOSIS — D72829 Elevated white blood cell count, unspecified: Secondary | ICD-10-CM | POA: Diagnosis not present

## 2015-11-23 LAB — CBC WITH DIFFERENTIAL/PLATELET
Basophils Absolute: 0.1 10*3/uL (ref 0.0–0.1)
Basophils Relative: 0.6 % (ref 0.0–3.0)
EOS PCT: 2.2 % (ref 0.0–5.0)
Eosinophils Absolute: 0.2 10*3/uL (ref 0.0–0.7)
HCT: 49.2 % (ref 39.0–52.0)
Hemoglobin: 16.2 g/dL (ref 13.0–17.0)
LYMPHS ABS: 2.7 10*3/uL (ref 0.7–4.0)
Lymphocytes Relative: 23.7 % (ref 12.0–46.0)
MCHC: 33 g/dL (ref 30.0–36.0)
MCV: 86.8 fl (ref 78.0–100.0)
MONO ABS: 0.9 10*3/uL (ref 0.1–1.0)
Monocytes Relative: 7.6 % (ref 3.0–12.0)
NEUTROS PCT: 65.9 % (ref 43.0–77.0)
Neutro Abs: 7.5 10*3/uL (ref 1.4–7.7)
Platelets: 198 10*3/uL (ref 150.0–400.0)
RBC: 5.67 Mil/uL (ref 4.22–5.81)
RDW: 13.6 % (ref 11.5–15.5)
WBC: 11.3 10*3/uL — ABNORMAL HIGH (ref 4.0–10.5)

## 2015-11-24 ENCOUNTER — Encounter: Payer: Self-pay | Admitting: *Deleted

## 2015-11-24 ENCOUNTER — Other Ambulatory Visit: Payer: Self-pay | Admitting: Internal Medicine

## 2015-11-24 DIAGNOSIS — D72829 Elevated white blood cell count, unspecified: Secondary | ICD-10-CM

## 2015-11-24 NOTE — Progress Notes (Signed)
Order placed for f/u cbc.   

## 2015-11-27 ENCOUNTER — Encounter: Payer: Self-pay | Admitting: Internal Medicine

## 2015-11-30 ENCOUNTER — Encounter: Payer: Self-pay | Admitting: Internal Medicine

## 2015-12-08 ENCOUNTER — Ambulatory Visit
Admission: RE | Admit: 2015-12-08 | Discharge: 2015-12-08 | Disposition: A | Discharge status: Home / Self Care | Payer: Medicare Other | Source: Ambulatory Visit | Attending: Urology | Admitting: Urology

## 2015-12-08 DIAGNOSIS — R310 Gross hematuria: Secondary | ICD-10-CM | POA: Diagnosis not present

## 2015-12-08 DIAGNOSIS — N400 Enlarged prostate without lower urinary tract symptoms: Secondary | ICD-10-CM | POA: Diagnosis not present

## 2015-12-08 MED ORDER — IOPAMIDOL (ISOVUE-300) INJECTION 61%
125.0000 mL | Freq: Once | INTRAVENOUS | Status: AC | PRN
  Administered 2015-12-08: 125 mL via INTRAVENOUS

## 2015-12-14 ENCOUNTER — Other Ambulatory Visit: Payer: Medicare Other

## 2015-12-15 ENCOUNTER — Ambulatory Visit (INDEPENDENT_AMBULATORY_CARE_PROVIDER_SITE_OTHER): Payer: Medicare Other | Admitting: Urology

## 2015-12-15 ENCOUNTER — Encounter: Payer: Self-pay | Admitting: Urology

## 2015-12-15 VITALS — BP 149/85 | HR 80 | Ht 73.0 in | Wt 286.0 lb

## 2015-12-15 DIAGNOSIS — R319 Hematuria, unspecified: Secondary | ICD-10-CM | POA: Diagnosis not present

## 2015-12-15 DIAGNOSIS — R310 Gross hematuria: Secondary | ICD-10-CM

## 2015-12-15 DIAGNOSIS — N401 Enlarged prostate with lower urinary tract symptoms: Secondary | ICD-10-CM

## 2015-12-15 DIAGNOSIS — N289 Disorder of kidney and ureter, unspecified: Secondary | ICD-10-CM | POA: Diagnosis not present

## 2015-12-15 DIAGNOSIS — N219 Calculus of lower urinary tract, unspecified: Secondary | ICD-10-CM | POA: Diagnosis not present

## 2015-12-15 DIAGNOSIS — N138 Other obstructive and reflux uropathy: Secondary | ICD-10-CM

## 2015-12-15 LAB — MICROSCOPIC EXAMINATION
Bacteria, UA: NONE SEEN
WBC UA: NONE SEEN /HPF (ref 0–?)

## 2015-12-15 LAB — URINALYSIS, COMPLETE
BILIRUBIN UA: NEGATIVE
Glucose, UA: NEGATIVE
KETONES UA: NEGATIVE
LEUKOCYTES UA: NEGATIVE
Nitrite, UA: NEGATIVE
PROTEIN UA: NEGATIVE
SPEC GRAV UA: 1.015 (ref 1.005–1.030)
UUROB: 0.2 mg/dL (ref 0.2–1.0)
pH, UA: 6 (ref 5.0–7.5)

## 2015-12-15 MED ORDER — LIDOCAINE HCL 2 % EX GEL
1.0000 "application " | Freq: Once | CUTANEOUS | Status: AC
  Administered 2015-12-15: 1 via URETHRAL

## 2015-12-15 MED ORDER — CIPROFLOXACIN HCL 500 MG PO TABS
500.0000 mg | ORAL_TABLET | Freq: Once | ORAL | Status: AC
  Administered 2015-12-15: 500 mg via ORAL

## 2015-12-15 NOTE — Progress Notes (Signed)
12/15/2015 4:32 PM   Michael Blevins 09-13-1950 [id number]  Referring provider: Einar Pheasant, MD [id number] [place], [place] [address]  Chief Complaint  Patient presents with  . Cysto    CTscan results    HPI:  65 year old male with intermittent gross hematuria for the last 5 years. He returns today for completion of his gross hematuria workup. He underwent CT urogram on 12/08/2015 which was unremarkable other than for an enlarged prostate with mass effect and the base of the bladder.  He does have significant urinary symptoms clubbing urinary hesitancy, weak stream, nocturia, and frequency. He was started on finasteride approximate one month ago for these urinary symptoms and prostatic enlargement. Since starting this medication, he has noticed a dramatic change and improvement in his urinary symptom is quite pleased with the result.  Rectal exam on 11/2015 showed a massively enlarged prostate without nodules.  The entire gland was not able to be palpated.  PSA on 11/08/15 3.49 from 2.81 one year ago.    He is a former smoker, with a 1 ppd history for 30 years. Quit 16 years ago. He is not exposed to secondhand smoke. 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    PMH: Past Medical History  Diagnosis Date  . Hypertension   . Kidney stone   . Venous insufficiency   . Back pain   . Atrial fibrillation ([place])   . Hyperlipidemia   . Sleep apnea   . Allergy     cats and pollen  . Asthma   . History of chicken pox   . Cellulitis   . Gout     Surgical History: Past Surgical History  Procedure Laterality Date  . Cardiac catheterization    . Appendectomy      Home Medications:    Medication List       This list is accurate as of: 12/15/15  4:32 PM.  Always use your most recent med list.               amLODipine 10 MG tablet  Commonly known as:  NORVASC  Take 10 mg by mouth daily.     digoxin 0.125 MG tablet  Commonly known as:   LANOXIN  Take 0.125 mg by mouth daily.     finasteride 5 MG tablet  Commonly known as:  PROSCAR  Take 1 tablet (5 mg total) by mouth daily.     furosemide 20 MG tablet  Commonly known as:  LASIX  Take 20 mg by mouth daily.     lisinopril 20 MG tablet  Commonly known as:  PRINIVIL,ZESTRIL  Take 20 mg by mouth daily.     lovastatin 20 MG tablet  Commonly known as:  MEVACOR  Take 20 mg by mouth at bedtime.     metoprolol 50 MG tablet  Commonly known as:  LOPRESSOR  Take 50 mg by mouth 2 (two) times daily.     warfarin 2 MG tablet  Commonly known as:  COUMADIN  Take 2 mg by mouth daily.     warfarin 5 MG tablet  Commonly known as:  COUMADIN  Take 5 mg by mouth daily.        Allergies: No Known Allergies  Family History: Family History  Problem Relation Age of Onset  . Hypertension Mother   . Diabetes Father   . Kidney disease Neg Hx   . Prostate cancer Neg Hx     Social History:  reports that  he has quit smoking. He has never used smokeless tobacco. He reports that he does not drink alcohol or use illicit drugs.  Physical Exam: BP 149/85 mmHg  Pulse 80  Ht 6\' 1"  (1.854 m)  Wt 286 lb (129.729 kg)  BMI 37.74 kg/m2  Constitutional:  Alert and oriented, No acute distress. HEENT: [place] AT, moist mucus membranes.  Trachea midline, no masses. Cardiovascular: No clubbing, cyanosis, or edema. Respiratory: Normal respiratory effort, no increased work of breathing. GI: Abdomen is soft, nontender, nondistended, no abdominal masses GU: No CVA tenderness. Normal circumcised phallus. Skin: No rashes, bruises or suspicious lesions. Neurologic: Grossly intact, no focal deficits, moving all 4 extremities. Psychiatric: Normal mood and affect.  Laboratory Data: Lab Results  Component Value Date   WBC 11.3* 11/23/2015   HGB 16.2 11/23/2015   HCT 49.2 11/23/2015   MCV 86.8 11/23/2015   PLT 198.0 11/23/2015    Lab Results  Component Value Date   CREATININE 0.87 11/13/2015     Lab Results  Component Value Date   PSA 3.49 11/08/2015   PSA 2.81 08/05/2014   Urinalysis UA today unremarkable  Pertinent Imaging:  Study Result     CLINICAL DATA: Patient had two episodes of painless gross hematuria 18 months apart. Patient doesn't have a prior history of hematuria. Hx Kidney stones and BPH.  EXAM: CT ABDOMEN AND PELVIS WITHOUT AND WITH CONTRAST  TECHNIQUE: Multidetector CT imaging of the abdomen and pelvis was performed following the standard protocol before and following the bolus administration of intravenous contrast.  CONTRAST: 127mL ISOVUE-300 IOPAMIDOL (ISOVUE-300) INJECTION 61%  COMPARISON: CT 10/07/2011  FINDINGS: Lower chest: Lung bases are clear.  Hepatobiliary: Normal liver. Small gallstone present. No biliary duct dilatation. Common bile duct normal.  Pancreas: Pancreas is normal. No ductal dilatation. No pancreatic inflammation.  Spleen: Normal spleen  Adrenals/urinary tract: Adrenal glands are normal.  No nephrolithiasis for ureterolithiasis.  No enhancing renal cortical lesion. No filling defect within the collecting systems or ureters.  No bladder calculi or enhancing bladder lesion. Enlarged prostate gland elevates the base the bladder. One nodular filling defect base the bladder measures 6 mm (image 89, series 12). This is adjacent to and likely an extension of the prostate nodular hypertrophy.  Stomach/Bowel: Stomach, small bowel, appendix, and cecum are normal. The colon and rectosigmoid colon are normal.  Vascular/Lymphatic: Abdominal aorta is normal caliber. There is no retroperitoneal or periportal lymphadenopathy. No pelvic lymphadenopathy.  Reproductive: Prostate gland is enlarged and elevates base the bladder as above. Prostate gland measures 71 mm in diameter  Other: No free fluid.  Musculoskeletal: No aggressive osseous lesion.  IMPRESSION: 1. No explanation for hematuria. No  nephrolithiasis, ureterolithiasis, enhancing renal cortical lesion, or filling defects within the collecting systems. 2. Enlarged prostate gland elevates base of bladder. One filling defect at the base the bladder is favored an extension of the prostate hypertrophy but not cannot completely exclude a papillary bladder lesion.   Electronically Signed  By: Suzy Bouchard M.D.  On: 12/08/2015 09:20        CT urogram personally reviewed today  Calculated prostate volume ~210 (7 x 7 x 6.5 cm)    Cystoscopy Procedure Note  Patient identification was confirmed, informed consent was obtained, and patient was prepped using Betadine solution.  Lidocaine jelly was administered per urethral meatus.    Preoperative abx where received prior to procedure.     Pre-Procedure: - Inspection reveals a normal caliber ureteral meatus.  Procedure: The flexible cystoscope was  introduced without difficulty - No urethral strictures/lesions are present. - Enlarged prostate with trilobar coaptation, friable mucosa, prostatic length 7 cm - Elevated bladder neck - Bilateral ureteral orifices identified - Bladder mucosa  reveals no ulcers, tumors, or lesions - No bladder stones - mod/ severe trabeculation  Retroflexion shows significant mass effect of enlarged prostate and the base of the bladder including the median lobe and lateral lobes. There is a tiny calcification with some mild erythema of the overlying mucosa.    Post-Procedure: - Patient tolerated the procedure well   Assessment & Plan:    1. Gross hematuria Likely prostatic in origin Cystoscopy today conference enlarged gland with friable mucosa No other GU pathology identified - Urinalysis, Complete - ciprofloxacin (CIPRO) tablet 500 mg; Take 1 tablet (500 mg total) by mouth once. - lidocaine (XYLOCAINE) 2 % jelly 1 application; Place 1 application into the urethra once.  2. BPH (benign prostatic hypertrophy) with  urinary obstruction Relatively symptomatic BPH, approximate 210 cc gland on CT scan Significant improvement since starting finasteride one month ago Discussed given prostatic size, he may eventually need an outlet procedure Recommend annual PSA/ DRE, Cr, IPSS  Advised to return sooner if dissatisfied with urinary symptoms   Return in about 1 year (around 12/14/2016) for PSA/ DRE/ IPSS.  Hollice Espy, MD  [facility] [address], [place], [place] [address] [phone]

## 2015-12-19 DIAGNOSIS — I480 Paroxysmal atrial fibrillation: Secondary | ICD-10-CM | POA: Diagnosis not present

## 2016-01-02 ENCOUNTER — Other Ambulatory Visit (INDEPENDENT_AMBULATORY_CARE_PROVIDER_SITE_OTHER): Payer: Medicare Other

## 2016-01-02 ENCOUNTER — Other Ambulatory Visit: Payer: Medicare Other

## 2016-01-02 DIAGNOSIS — D72829 Elevated white blood cell count, unspecified: Secondary | ICD-10-CM | POA: Diagnosis not present

## 2016-01-02 LAB — CBC WITH DIFFERENTIAL/PLATELET
Basophils Absolute: 0.1 10*3/uL (ref 0.0–0.1)
Basophils Relative: 0.5 % (ref 0.0–3.0)
EOS ABS: 0.2 10*3/uL (ref 0.0–0.7)
Eosinophils Relative: 2 % (ref 0.0–5.0)
HCT: 48.8 % (ref 39.0–52.0)
HEMOGLOBIN: 16.4 g/dL (ref 13.0–17.0)
Lymphocytes Relative: 21.8 % (ref 12.0–46.0)
Lymphs Abs: 2.5 10*3/uL (ref 0.7–4.0)
MCHC: 33.5 g/dL (ref 30.0–36.0)
MCV: 86 fl (ref 78.0–100.0)
MONO ABS: 1 10*3/uL (ref 0.1–1.0)
Monocytes Relative: 8.7 % (ref 3.0–12.0)
Neutro Abs: 7.5 10*3/uL (ref 1.4–7.7)
Neutrophils Relative %: 67 % (ref 43.0–77.0)
Platelets: 198 10*3/uL (ref 150.0–400.0)
RBC: 5.68 Mil/uL (ref 4.22–5.81)
RDW: 13.6 % (ref 11.5–15.5)
WBC: 11.3 10*3/uL — AB (ref 4.0–10.5)

## 2016-01-03 ENCOUNTER — Encounter: Payer: Self-pay | Admitting: Internal Medicine

## 2016-01-05 ENCOUNTER — Other Ambulatory Visit: Payer: Self-pay | Admitting: Urology

## 2016-01-18 DIAGNOSIS — I480 Paroxysmal atrial fibrillation: Secondary | ICD-10-CM | POA: Diagnosis not present

## 2016-02-15 DIAGNOSIS — I480 Paroxysmal atrial fibrillation: Secondary | ICD-10-CM | POA: Diagnosis not present

## 2016-03-14 DIAGNOSIS — I482 Chronic atrial fibrillation: Secondary | ICD-10-CM | POA: Diagnosis not present

## 2016-04-05 DIAGNOSIS — I480 Paroxysmal atrial fibrillation: Secondary | ICD-10-CM | POA: Diagnosis not present

## 2016-04-30 ENCOUNTER — Ambulatory Visit (INDEPENDENT_AMBULATORY_CARE_PROVIDER_SITE_OTHER): Payer: Medicare Other | Admitting: Internal Medicine

## 2016-04-30 ENCOUNTER — Encounter: Payer: Self-pay | Admitting: Internal Medicine

## 2016-04-30 VITALS — BP 138/78 | HR 64 | Temp 97.9°F | Ht 73.0 in | Wt 286.6 lb

## 2016-04-30 DIAGNOSIS — G4733 Obstructive sleep apnea (adult) (pediatric): Secondary | ICD-10-CM

## 2016-04-30 DIAGNOSIS — R21 Rash and other nonspecific skin eruption: Secondary | ICD-10-CM

## 2016-04-30 DIAGNOSIS — Z0000 Encounter for general adult medical examination without abnormal findings: Secondary | ICD-10-CM | POA: Diagnosis not present

## 2016-04-30 DIAGNOSIS — I482 Chronic atrial fibrillation, unspecified: Secondary | ICD-10-CM

## 2016-04-30 DIAGNOSIS — E7800 Pure hypercholesterolemia, unspecified: Secondary | ICD-10-CM | POA: Diagnosis not present

## 2016-04-30 DIAGNOSIS — R319 Hematuria, unspecified: Secondary | ICD-10-CM

## 2016-04-30 DIAGNOSIS — I10 Essential (primary) hypertension: Secondary | ICD-10-CM

## 2016-04-30 NOTE — Progress Notes (Signed)
Patient ID: Michael Blevins, male   DOB: 1950-07-25, 65 y.o.   MRN: [id number]   Subjective:    Patient ID: Michael Blevins, male    DOB: Sep 29, 1950, 65 y.o.   MRN: [id number]  HPI  Patient with past history of hypercholesterolemia, afib and hypertension.  He comes in today to follow up on this as well as for a complete physical exam.  He is followed by cardiology.  Last seen 10/2015.  On coumadin.  Evaluated recently by urology for gross hematuria.  Had CT as outlined.  Also had cysto.  Was placed on finesteride.  States doing well with this.  No further bleeding.  Urinating better.  No chest pain.  Tries to stay active.  Breathing is stable.  No nausea or vomiting.  Does report some right knee/hip pain.  Notices when goes up stairs.  Does not notice with regular walking.  Started 6-7 months ago.  Discussed further w/up and evaluation.  He declines.   Also reports some pain in the right wrist.  No known injury. Again discussed further evaluation.  He declines.  Will try a wrist splint.  Overall he feels pretty good.     Past Medical History:  Diagnosis Date  . Allergy    cats and pollen  . Asthma   . Atrial fibrillation ([place])   . Back pain   . Cellulitis   . Gout   . History of chicken pox   . Hyperlipidemia   . Hypertension   . Kidney stone   . Sleep apnea   . Venous insufficiency    Past Surgical History:  Procedure Laterality Date  . APPENDECTOMY    . CARDIAC CATHETERIZATION     Family History  Problem Relation Age of Onset  . Hypertension Mother   . Diabetes Father   . Kidney disease Neg Hx   . Prostate cancer Neg Hx    Social History   Social History  . Marital status: Married    Spouse name: N/A  . Number of children: N/A  . Years of education: N/A   Social History Main Topics  . Smoking status: Former Research scientist (life sciences)  . Smokeless tobacco: Never Used     Comment: quit 33 years  . Alcohol use No  . Drug use: No  . Sexual activity: Not Asked   Other Topics Concern    . None   Social History Narrative  . None    Outpatient Encounter Prescriptions as of 04/30/2016  Medication Sig  . amLODipine (NORVASC) 10 MG tablet Take 10 mg by mouth daily.  . digoxin (LANOXIN) 0.125 MG tablet Take 0.125 mg by mouth daily.  . finasteride (PROSCAR) 5 MG tablet Take 1 tablet (5 mg total) by mouth daily.  . furosemide (LASIX) 20 MG tablet Take 20 mg by mouth daily.  Marland Kitchen lisinopril (PRINIVIL,ZESTRIL) 20 MG tablet Take 20 mg by mouth daily.  Marland Kitchen lovastatin (MEVACOR) 20 MG tablet Take 20 mg by mouth at bedtime.  . metoprolol (LOPRESSOR) 50 MG tablet Take 50 mg by mouth 2 (two) times daily.   Marland Kitchen warfarin (COUMADIN) 2 MG tablet Take 2 mg by mouth daily.  Marland Kitchen warfarin (COUMADIN) 5 MG tablet Take 5 mg by mouth daily.   No facility-administered encounter medications on file as of 04/30/2016.     Review of Systems  Constitutional: Negative for appetite change and unexpected weight change.  HENT: Negative for congestion and sinus pressure.   Respiratory: Negative for cough, chest tightness  and shortness of breath.   Cardiovascular: Negative for chest pain, palpitations and leg swelling.  Gastrointestinal: Negative for abdominal pain, diarrhea, nausea and vomiting.  Genitourinary: Negative for difficulty urinating and dysuria.  Musculoskeletal:       Reports the right hip and knee pain as outlined.  Also right wrist pain.     Skin: Negative for color change and rash.  Neurological: Negative for dizziness, light-headedness and headaches.  Psychiatric/Behavioral: Negative for agitation and dysphoric mood.       Objective:     Blood pressure rechecked by me:  [id number]  Physical Exam  Constitutional: He is oriented to person, place, and time. He appears well-developed and well-nourished. No distress.  HENT:  Head: Normocephalic and atraumatic.  Nose: Nose normal.  Mouth/Throat: Oropharynx is clear and moist. No oropharyngeal exudate.  Eyes: Conjunctivae are normal. Right  eye exhibits no discharge. Left eye exhibits no discharge.  Neck: Neck supple. No thyromegaly present.  Cardiovascular: Normal rate and regular rhythm.   Pulmonary/Chest: Breath sounds normal. No respiratory distress. He has no wheezes.  Abdominal: Soft. Bowel sounds are normal. There is no tenderness.  Genitourinary:  Genitourinary Comments: Performed by urology.   Musculoskeletal: He exhibits no edema or tenderness.  Increased pain with rotation of forearm.    Lymphadenopathy:    He has no cervical adenopathy.  Neurological: He is alert and oriented to person, place, and time.  Skin: Skin is warm and dry. No rash noted. No erythema.  Erythematous rash noted over nose and cheeks.    Psychiatric: He has a normal mood and affect. His behavior is normal.    BP 138/78   Pulse 64   Temp 97.9 F (36.6 C) (Oral)   Ht 6\' 1"  (1.854 m)   Wt 286 lb 9.6 oz (130 kg)   SpO2 94%   BMI 37.81 kg/m  Wt Readings from Last 3 Encounters:  04/30/16 286 lb 9.6 oz (130 kg)  12/15/15 286 lb (129.7 kg)  11/13/15 287 lb 3.2 oz (130.3 kg)     Lab Results  Component Value Date   WBC 11.3 (H) 01/02/2016   HGB 16.4 01/02/2016   HCT 48.8 01/02/2016   PLT 198.0 01/02/2016   GLUCOSE 82 11/08/2015   CHOL 120 11/08/2015   TRIG 113.0 11/08/2015   HDL 30.40 (L) 11/08/2015   LDLCALC 67 11/08/2015   ALT 26 11/08/2015   AST 26 11/08/2015   NA 140 11/08/2015   K 4.3 11/08/2015   CL 106 11/08/2015   CREATININE 0.87 11/13/2015   BUN 16 11/13/2015   CO2 29 11/08/2015   TSH 2.45 11/08/2015   PSA 3.49 11/08/2015   INR 2.7 (A) 02/09/2014    Ct Abdomen Pelvis W Wo Contrast  Result Date: 12/08/2015 CLINICAL DATA:  Patient had two episodes of painless gross hematuria 18 months apart. Patient doesn't have a prior history of hematuria. Hx Kidney stones and BPH. EXAM: CT ABDOMEN AND PELVIS WITHOUT AND WITH CONTRAST TECHNIQUE: Multidetector CT imaging of the abdomen and pelvis was performed following the  standard protocol before and following the bolus administration of intravenous contrast. CONTRAST:  173mL ISOVUE-300 IOPAMIDOL (ISOVUE-300) INJECTION 61% COMPARISON:  CT 10/07/2011 FINDINGS: Lower chest: Lung bases are clear. Hepatobiliary: Normal liver. Small gallstone present. No biliary duct dilatation. Common bile duct normal. Pancreas: Pancreas is normal. No ductal dilatation. No pancreatic inflammation. Spleen: Normal spleen Adrenals/urinary tract: Adrenal glands are normal. No nephrolithiasis for ureterolithiasis. No enhancing renal cortical lesion. No filling defect  within the collecting systems or ureters. No bladder calculi or enhancing bladder lesion. Enlarged prostate gland elevates the base the bladder. One nodular filling defect base the bladder measures 6 mm (image 89, series 12). This is adjacent to and likely an extension of the prostate nodular hypertrophy. Stomach/Bowel: Stomach, small bowel, appendix, and cecum are normal. The colon and rectosigmoid colon are normal. Vascular/Lymphatic: Abdominal aorta is normal caliber. There is no retroperitoneal or periportal lymphadenopathy. No pelvic lymphadenopathy. Reproductive: Prostate gland is enlarged and elevates base the bladder as above. Prostate gland measures 71 mm in diameter Other: No free fluid. Musculoskeletal: No aggressive osseous lesion. IMPRESSION: 1. No explanation for hematuria. No nephrolithiasis, ureterolithiasis, enhancing renal cortical lesion, or filling defects within the collecting systems. 2. Enlarged prostate gland elevates base of bladder. One filling defect at the base the bladder is favored an extension of the prostate hypertrophy but not cannot completely exclude a papillary bladder lesion. Electronically Signed   By: Suzy Bouchard M.D.   On: 12/08/2015 09:20       Assessment & Plan:   Problem List Items Addressed This Visit    Atrial fibrillation ([place])    Followed by cardiology.  On coumadin.  They are following  pt/inr.       Blood in the urine    Had recent w/up by urology as outlined.  Placed on finesteride.  Doing better.  Continue f/u with urology.        Essential hypertension    Blood pressure under reasonable control.  Continue same medication regimen.  Follow pressures.  Follow metabolic panel.        Relevant Orders   CBC with Differential/Platelet   Basic metabolic panel   Health care maintenance    Physical today 04/30/16.  Followed by urology for prostate check and psa.  PSA 3.49 - 10/2015.  Had colonoscopy previously.  Need results.        Hypercholesterolemia    On lovastatin.  Low cholesterol diet and exercise.  Follow lipid panel and liver function tests.        Relevant Orders   Hepatic function panel   Lipid panel   Obstructive sleep apnea    CPAP.        Other Visit Diagnoses    Facial rash    -  Primary   persistent.  has seen dermatology previously.  wants to get established here.  refer to dermatology.    Relevant Orders   Ambulatory referral to Dermatology       Einar Pheasant, MD

## 2016-04-30 NOTE — Progress Notes (Signed)
Pre visit review using our clinic review tool, if applicable. No additional management support is needed unless otherwise documented below in the visit note. 

## 2016-05-05 ENCOUNTER — Encounter: Payer: Self-pay | Admitting: Internal Medicine

## 2016-05-05 NOTE — Assessment & Plan Note (Signed)
Followed by cardiology.  On coumadin.  They are following pt/inr.   

## 2016-05-05 NOTE — Assessment & Plan Note (Signed)
Blood pressure under reasonable control.  Continue same medication regimen.  Follow pressures.  Follow metabolic panel.   

## 2016-05-05 NOTE — Assessment & Plan Note (Signed)
On lovastatin.  Low cholesterol diet and exercise.  Follow lipid panel and liver function tests.   

## 2016-05-05 NOTE — Assessment & Plan Note (Signed)
CPAP.  

## 2016-05-05 NOTE — Assessment & Plan Note (Signed)
Had recent w/up by urology as outlined.  Placed on finesteride.  Doing better.  Continue f/u with urology.

## 2016-05-05 NOTE — Assessment & Plan Note (Addendum)
Physical today 04/30/16.  Followed by urology for prostate check and psa.  PSA 3.49 - 10/2015.  Had colonoscopy previously.  Need results.

## 2016-05-07 ENCOUNTER — Encounter: Payer: Self-pay | Admitting: Internal Medicine

## 2016-05-16 DIAGNOSIS — I480 Paroxysmal atrial fibrillation: Secondary | ICD-10-CM | POA: Diagnosis not present

## 2016-05-20 ENCOUNTER — Other Ambulatory Visit (INDEPENDENT_AMBULATORY_CARE_PROVIDER_SITE_OTHER): Payer: Medicare Other

## 2016-05-20 DIAGNOSIS — Z9889 Other specified postprocedural states: Secondary | ICD-10-CM | POA: Diagnosis not present

## 2016-05-20 DIAGNOSIS — Q211 Atrial septal defect: Secondary | ICD-10-CM | POA: Diagnosis not present

## 2016-05-20 DIAGNOSIS — G4733 Obstructive sleep apnea (adult) (pediatric): Secondary | ICD-10-CM | POA: Diagnosis not present

## 2016-05-20 DIAGNOSIS — I872 Venous insufficiency (chronic) (peripheral): Secondary | ICD-10-CM | POA: Diagnosis not present

## 2016-05-20 DIAGNOSIS — R0602 Shortness of breath: Secondary | ICD-10-CM | POA: Insufficient documentation

## 2016-05-20 DIAGNOSIS — Q2111 Secundum atrial septal defect: Secondary | ICD-10-CM | POA: Insufficient documentation

## 2016-05-20 DIAGNOSIS — I10 Essential (primary) hypertension: Secondary | ICD-10-CM | POA: Diagnosis not present

## 2016-05-20 DIAGNOSIS — E7800 Pure hypercholesterolemia, unspecified: Secondary | ICD-10-CM

## 2016-05-20 DIAGNOSIS — I480 Paroxysmal atrial fibrillation: Secondary | ICD-10-CM | POA: Diagnosis not present

## 2016-05-20 LAB — BASIC METABOLIC PANEL
BUN: 14 mg/dL (ref 6–23)
CO2: 33 meq/L — AB (ref 19–32)
Calcium: 9.1 mg/dL (ref 8.4–10.5)
Chloride: 104 mEq/L (ref 96–112)
Creatinine, Ser: 0.94 mg/dL (ref 0.40–1.50)
GFR: 85.32 mL/min (ref >60.00)
GLUCOSE: 104 mg/dL — AB (ref 70–99)
POTASSIUM: 4.6 meq/L (ref 3.5–5.1)
SODIUM: 141 meq/L (ref 135–145)

## 2016-05-20 LAB — CBC WITH DIFFERENTIAL/PLATELET
Basophils Absolute: 0 10*3/uL (ref 0.0–0.1)
Basophils Relative: 0.3 % (ref 0.0–3.0)
Eosinophils Absolute: 0.3 10*3/uL (ref 0.0–0.7)
Eosinophils Relative: 2.5 % (ref 0.0–5.0)
HCT: 48.8 % (ref 39.0–52.0)
Hemoglobin: 16.8 g/dL (ref 13.0–17.0)
Lymphocytes Relative: 22.2 % (ref 12.0–46.0)
Lymphs Abs: 2.4 10*3/uL (ref 0.7–4.0)
MCHC: 34.4 g/dL (ref 30.0–36.0)
MCV: 86.8 fl (ref 78.0–100.0)
Monocytes Absolute: 0.9 10*3/uL (ref 0.1–1.0)
Monocytes Relative: 8.2 % (ref 3.0–12.0)
Neutro Abs: 7.1 10*3/uL (ref 1.4–7.7)
Neutrophils Relative %: 66.8 % (ref 43.0–77.0)
Platelets: 192 10*3/uL (ref 150.0–400.0)
RBC: 5.62 Mil/uL (ref 4.22–5.81)
RDW: 13.3 % (ref 11.5–15.5)
WBC: 10.6 10*3/uL — ABNORMAL HIGH (ref 4.0–10.5)

## 2016-05-20 LAB — LIPID PANEL
CHOLESTEROL: 130 mg/dL (ref 0–200)
HDL: 34.7 mg/dL — ABNORMAL LOW (ref >39.00)
LDL Cholesterol: 65 mg/dL (ref 0–99)
NonHDL: 95.12
Total CHOL/HDL Ratio: 4
Triglycerides: 150 mg/dL — ABNORMAL HIGH (ref 0.0–149.0)
VLDL: 30 mg/dL (ref 0.0–40.0)

## 2016-05-20 LAB — HEPATIC FUNCTION PANEL
ALBUMIN: 4.1 g/dL (ref 3.5–5.2)
ALT: 24 U/L (ref 0–53)
AST: 22 U/L (ref 0–37)
Alkaline Phosphatase: 68 U/L (ref 39–117)
Bilirubin, Direct: 0.2 mg/dL (ref 0.0–0.3)
TOTAL PROTEIN: 6.7 g/dL (ref 6.0–8.3)
Total Bilirubin: 1 mg/dL (ref 0.2–1.2)

## 2016-05-21 ENCOUNTER — Encounter: Payer: Self-pay | Admitting: Internal Medicine

## 2016-06-13 DIAGNOSIS — I480 Paroxysmal atrial fibrillation: Secondary | ICD-10-CM | POA: Diagnosis not present

## 2016-06-14 DIAGNOSIS — Q211 Atrial septal defect: Secondary | ICD-10-CM | POA: Diagnosis not present

## 2016-06-14 DIAGNOSIS — I480 Paroxysmal atrial fibrillation: Secondary | ICD-10-CM | POA: Diagnosis not present

## 2016-06-14 DIAGNOSIS — R0602 Shortness of breath: Secondary | ICD-10-CM | POA: Diagnosis not present

## 2016-06-19 DIAGNOSIS — E7800 Pure hypercholesterolemia, unspecified: Secondary | ICD-10-CM | POA: Diagnosis not present

## 2016-06-19 DIAGNOSIS — Q211 Atrial septal defect: Secondary | ICD-10-CM | POA: Diagnosis not present

## 2016-06-19 DIAGNOSIS — R0602 Shortness of breath: Secondary | ICD-10-CM | POA: Diagnosis not present

## 2016-06-19 DIAGNOSIS — I872 Venous insufficiency (chronic) (peripheral): Secondary | ICD-10-CM | POA: Diagnosis not present

## 2016-06-19 DIAGNOSIS — Z9889 Other specified postprocedural states: Secondary | ICD-10-CM | POA: Diagnosis not present

## 2016-06-19 DIAGNOSIS — G4733 Obstructive sleep apnea (adult) (pediatric): Secondary | ICD-10-CM | POA: Diagnosis not present

## 2016-06-19 DIAGNOSIS — I10 Essential (primary) hypertension: Secondary | ICD-10-CM | POA: Diagnosis not present

## 2016-06-19 DIAGNOSIS — I480 Paroxysmal atrial fibrillation: Secondary | ICD-10-CM | POA: Diagnosis not present

## 2016-06-21 ENCOUNTER — Encounter: Payer: Self-pay | Admitting: Internal Medicine

## 2016-06-21 DIAGNOSIS — I480 Paroxysmal atrial fibrillation: Secondary | ICD-10-CM

## 2016-06-21 NOTE — Telephone Encounter (Signed)
Order placed for cardiology referral.   

## 2016-07-04 ENCOUNTER — Encounter: Payer: Self-pay | Admitting: *Deleted

## 2016-07-05 ENCOUNTER — Ambulatory Visit (INDEPENDENT_AMBULATORY_CARE_PROVIDER_SITE_OTHER): Payer: Medicare Other | Admitting: Cardiovascular Disease

## 2016-07-05 ENCOUNTER — Encounter: Payer: Self-pay | Admitting: Cardiovascular Disease

## 2016-07-05 VITALS — BP 150/88 | HR 61 | Ht 73.0 in | Wt 283.8 lb

## 2016-07-05 DIAGNOSIS — Z7189 Other specified counseling: Secondary | ICD-10-CM

## 2016-07-05 DIAGNOSIS — G4733 Obstructive sleep apnea (adult) (pediatric): Secondary | ICD-10-CM

## 2016-07-05 DIAGNOSIS — I482 Chronic atrial fibrillation, unspecified: Secondary | ICD-10-CM

## 2016-07-05 DIAGNOSIS — E7800 Pure hypercholesterolemia, unspecified: Secondary | ICD-10-CM

## 2016-07-05 DIAGNOSIS — I10 Essential (primary) hypertension: Secondary | ICD-10-CM

## 2016-07-05 DIAGNOSIS — I361 Nonrheumatic tricuspid (valve) insufficiency: Secondary | ICD-10-CM

## 2016-07-05 DIAGNOSIS — Q211 Atrial septal defect, unspecified: Secondary | ICD-10-CM | POA: Insufficient documentation

## 2016-07-05 DIAGNOSIS — I872 Venous insufficiency (chronic) (peripheral): Secondary | ICD-10-CM

## 2016-07-05 DIAGNOSIS — I272 Pulmonary hypertension, unspecified: Secondary | ICD-10-CM

## 2016-07-05 MED ORDER — FUROSEMIDE 20 MG PO TABS: 20.0000 mg | ORAL_TABLET | Freq: Two times a day (BID) | ORAL | 3 refills | Status: DC | PRN

## 2016-07-05 NOTE — Patient Instructions (Signed)

## 2016-07-05 NOTE — Progress Notes (Signed)
Cardiology Office Note  Date:  07/05/2016   ID:  Michael Blevins, DOB 10-08-1950, MRN [id number]  PCP:  Michael Pheasant, MD   Chief Complaint  Patient presents with  . other    Former Michael Blevins patient with a H/O A-fib and would like switch physicians. Meds reviewed by the pt's med list. "doing well."     HPI:  Michael Blevins is a 66 year old gentleman with history of obesity,Paroxysmal atrial fibrillation, hx of cardioversion in 2011, did not hold, on chronic anticoagulation , sleep apnea on CPAP,  Essential hypertension, Hyperlipidemia, Small atrial septal defect ( QP/QS 1.08 ), Venous insufficiency, prior cardiac catheterization in 2011 by report showing no significant coronary disease, who presents by referral from Michael Blevins to establish care for his paroxysmal atrial fibrillation, AST, hyperlipidemia Remote history of smoking for 17 years  Previously seen at [facility], Records reviewed including stress echocardiogram 06/14/2016 performed for shortness of breath, exercised 6 minutes on a Bruce protocol without chest pain or ECG changes. Echo showed no wall motion abnormality concerning for ischemia  Limited echocardiogram revealed normal left ventricular function, with LV ejection fraction greater than 55%. My review of the study showed details of moderate TR, mild to moderately elevated right heart pressures, no mention of his left atrial or right atrial size, no mention of atrial septal defect   history of paroxysmal atrial fibrillation, chads Vasc score 4, currently on warfarin, He is interested in moving to our anticoagulation clinic    history of essential hypertension,  He reports that his blood pressure is well controlled on amlodipine, furosemide, lisinopril and metoprolol tartrate.    he has chronic mild lower extremity edema, nonpitting , per the notes felt to be secondary to venous insufficiency   negative cardiac cath 2011  . Sleep apnea   Atrial fib: no sympytoms, had  bleeding on pradaxa, has INr checked at [facility]  EKG on today's visit shows atrial fibrillation with ventricular rate 61 bpm, left axis deviation    PMH :   has a past medical history of Allergy; Asthma; Atrial fibrillation ([place]); Back pain; Cellulitis; Gout; History of chicken pox; Hyperlipidemia; Hypertension; Kidney stone; Sleep apnea; and Venous insufficiency.  PSH:    Past Surgical History:  Procedure Laterality Date  . APPENDECTOMY    . CARDIAC CATHETERIZATION      Current Outpatient Prescriptions  Medication Sig Dispense Refill  . amLODipine (NORVASC) 10 MG tablet Take 10 mg by mouth daily.    . digoxin (LANOXIN) 0.125 MG tablet Take 0.125 mg by mouth daily.    . finasteride (PROSCAR) 5 MG tablet Take 1 tablet (5 mg total) by mouth daily. 90 tablet 3  . furosemide (LASIX) 20 MG tablet Take 1 tablet (20 mg total) by mouth 2 (two) times daily as needed. 180 tablet 3  . lisinopril (PRINIVIL,ZESTRIL) 20 MG tablet Take 20 mg by mouth daily.    Marland Kitchen lovastatin (MEVACOR) 20 MG tablet Take 20 mg by mouth at bedtime.    . metoprolol (LOPRESSOR) 50 MG tablet Take 50 mg by mouth 2 (two) times daily.   5  . warfarin (COUMADIN) 2 MG tablet Take 2 mg by mouth daily.    Marland Kitchen warfarin (COUMADIN) 5 MG tablet Take 5 mg by mouth daily.     No current facility-administered medications for this visit.      Allergies:   Patient has no known allergies.   Social History:  The patient  reports that he has quit smoking. He  has never used smokeless tobacco. He reports that he does not drink alcohol or use drugs.   Family History:   family history includes Diabetes in his father; Hypertension in his mother.    Review of Systems: Review of Systems  Constitutional: Negative.   Respiratory: Negative.   Cardiovascular: Positive for leg swelling.  Gastrointestinal: Negative.   Musculoskeletal: Negative.   Neurological: Negative.   Psychiatric/Behavioral: Negative.   All other systems reviewed and are  negative.    PHYSICAL EXAM: VS:  BP (!) 150/88 (BP Location: Left Arm, Patient Position: Sitting, Cuff Size: Normal)   Pulse 61   Ht 6\' 1"  (1.854 m)   Wt 283 lb 12 oz (128.7 kg)   BMI 37.44 kg/m  , BMI Body mass index is 37.44 kg/m. GEN: Well nourished, well developed, in no acute distress ,  obese HEENT: normal  Neck: no JVD, carotid bruits, or masses Cardiac: RRR; no murmurs, rubs, or gallops,no significant pitting  edema ,  he does have trace nonpitting edema Respiratory:  clear to auscultation bilaterally, normal work of breathing GI: soft, nontender, nondistended, + BS MS: no deformity or atrophy  Skin: warm and dry, no rash Neuro:  Strength and sensation are intact Psych: euthymic mood, full affect    Recent Labs: 11/08/2015: TSH 2.45 05/20/2016: ALT 24; BUN 14; Creatinine, Ser 0.94; Hemoglobin 16.8; Platelets 192.0; Potassium 4.6; Sodium 141    Lipid Panel Lab Results  Component Value Date   CHOL 130 05/20/2016   HDL 34.70 (L) 05/20/2016   LDLCALC 65 05/20/2016   TRIG 150.0 (H) 05/20/2016      Wt Readings from Last 3 Encounters:  07/05/16 283 lb 12 oz (128.7 kg)  04/30/16 286 lb 9.6 oz (130 kg)  12/15/15 286 lb (129.7 kg)       ASSESSMENT AND PLAN:  Chronic atrial fibrillation (HCC) - Plan: EKG 12-Lead Tolerating anticoagulation, rate well controlled Discussed rhythm in detail, role as can have on fluid retention CHADS VASC 4  Essential hypertension - Plan: EKG 12-Lead Blood pressure elevated on today's visit, he reports is well controlled at home No medication changes made at this time, he will monitor blood pressure and call our office if this continues to run high  Hypercholesterolemia Cholesterol is at goal on the current lipid regimen. No changes to the medications were made.  Venous insufficiency Recommended compression hose Prior history of vein surgery  Encounter for anticoagulation discussion and counseling Tolerating warfarin, does  not want to change to other agents such as NOAC  previously was on pradaxa  but had complications  Obstructive sleep apnea On CPAP  Pulmonary hypertension  long discussion concerning his elevated right heart pressures seen on recent limited echocardiogram outside our office . Pressure likely 45 up to 50 mmHg  Suggested he could try extra Lasix twice a day dosing alternating with daily dosing with periodic monitoring of renal function   Non-rheumatic tricuspid valve insufficiency moderate TR per the echo, no murmur appreciated on exam today  May be dynamic, might improve with slight increase in diuresis   Morbid obesity ([place]) We have encouraged continued exercise, careful diet management in an effort to lose weight.  Atrial septal defect  no recent mention of atrial septal defect on limited echocardiogram done at outside office Would likely recommend repeat full echocardiogram on his next visit  Long discussion concerning the above, prior records reviewed with him in detail Each topic above discussed in detail, all questions answered  Total encounter  time more than 60 minutes  Greater than 50% was spent in counseling and coordination of care with the patient  Disposition:   F/U  6 months   Orders Placed This Encounter  Procedures  . EKG 12-Lead     Signed, Esmond Plants, M.D., Ph.D. 07/05/2016  [place], [place]

## 2016-07-10 DIAGNOSIS — L718 Other rosacea: Secondary | ICD-10-CM | POA: Diagnosis not present

## 2016-07-10 DIAGNOSIS — X32XXXA Exposure to sunlight, initial encounter: Secondary | ICD-10-CM | POA: Diagnosis not present

## 2016-07-10 DIAGNOSIS — L570 Actinic keratosis: Secondary | ICD-10-CM | POA: Diagnosis not present

## 2016-07-10 DIAGNOSIS — D3612 Benign neoplasm of peripheral nerves and autonomic nervous system, upper limb, including shoulder: Secondary | ICD-10-CM | POA: Diagnosis not present

## 2016-07-17 ENCOUNTER — Ambulatory Visit (INDEPENDENT_AMBULATORY_CARE_PROVIDER_SITE_OTHER): Payer: Medicare Other

## 2016-07-17 DIAGNOSIS — I482 Chronic atrial fibrillation, unspecified: Secondary | ICD-10-CM

## 2016-07-17 DIAGNOSIS — Z5181 Encounter for therapeutic drug level monitoring: Secondary | ICD-10-CM | POA: Insufficient documentation

## 2016-07-17 LAB — POCT INR: INR: 3

## 2016-08-05 ENCOUNTER — Telehealth: Payer: Self-pay | Admitting: Internal Medicine

## 2016-08-05 MED ORDER — OSELTAMIVIR PHOSPHATE 75 MG PO CAPS: 75.0000 mg | ORAL_CAPSULE | Freq: Every day | ORAL | 0 refills | Status: DC

## 2016-08-05 NOTE — Telephone Encounter (Signed)
Pt's wife called and was diagnosed with flu.  Husband without symptoms.  Tamiflu called in for Michael Blevins to take prophylactic dose and will change to treatment dose if symptoms.

## 2016-08-07 ENCOUNTER — Ambulatory Visit (INDEPENDENT_AMBULATORY_CARE_PROVIDER_SITE_OTHER): Payer: Medicare Other

## 2016-08-07 ENCOUNTER — Other Ambulatory Visit: Payer: Self-pay

## 2016-08-07 DIAGNOSIS — I482 Chronic atrial fibrillation, unspecified: Secondary | ICD-10-CM

## 2016-08-07 DIAGNOSIS — Z5181 Encounter for therapeutic drug level monitoring: Secondary | ICD-10-CM | POA: Diagnosis not present

## 2016-08-07 LAB — POCT INR: INR: 3.8

## 2016-08-07 MED ORDER — LOVASTATIN 20 MG PO TABS: 20.0000 mg | ORAL_TABLET | Freq: Every day | ORAL | 3 refills | Status: DC

## 2016-08-07 MED ORDER — LISINOPRIL 20 MG PO TABS: 20.0000 mg | ORAL_TABLET | Freq: Every day | ORAL | 3 refills | Status: DC

## 2016-08-07 MED ORDER — METOPROLOL TARTRATE 50 MG PO TABS: 50.0000 mg | ORAL_TABLET | Freq: Two times a day (BID) | ORAL | 3 refills | Status: DC

## 2016-08-07 MED ORDER — FUROSEMIDE 20 MG PO TABS: 20.0000 mg | ORAL_TABLET | Freq: Two times a day (BID) | ORAL | 0 refills | Status: DC | PRN

## 2016-08-07 MED ORDER — DIGOXIN 125 MCG PO TABS: 0.1250 mg | ORAL_TABLET | Freq: Every day | ORAL | 3 refills | Status: DC

## 2016-08-07 MED ORDER — AMLODIPINE BESYLATE 10 MG PO TABS: 10.0000 mg | ORAL_TABLET | Freq: Every day | ORAL | 3 refills | Status: DC

## 2016-08-07 NOTE — Telephone Encounter (Signed)
Pt saw Dr Rockey Situ in office on 07/05/16, was told you could refill rx for 90 day supply, please send to [place], [place].

## 2016-08-27 DIAGNOSIS — H2513 Age-related nuclear cataract, bilateral: Secondary | ICD-10-CM | POA: Diagnosis not present

## 2016-08-28 ENCOUNTER — Other Ambulatory Visit: Payer: Self-pay | Admitting: Cardiovascular Disease

## 2016-08-28 ENCOUNTER — Ambulatory Visit (INDEPENDENT_AMBULATORY_CARE_PROVIDER_SITE_OTHER): Payer: Medicare Other

## 2016-08-28 DIAGNOSIS — Z5181 Encounter for therapeutic drug level monitoring: Secondary | ICD-10-CM | POA: Diagnosis not present

## 2016-08-28 DIAGNOSIS — I482 Chronic atrial fibrillation, unspecified: Secondary | ICD-10-CM

## 2016-08-28 LAB — POCT INR: INR: 3.7

## 2016-08-28 NOTE — Telephone Encounter (Signed)
Please review for refill. Thanks!  

## 2016-09-12 ENCOUNTER — Encounter: Payer: Self-pay | Admitting: Internal Medicine

## 2016-09-18 ENCOUNTER — Ambulatory Visit (INDEPENDENT_AMBULATORY_CARE_PROVIDER_SITE_OTHER): Payer: Medicare Other

## 2016-09-18 DIAGNOSIS — I482 Chronic atrial fibrillation, unspecified: Secondary | ICD-10-CM

## 2016-09-18 DIAGNOSIS — Z5181 Encounter for therapeutic drug level monitoring: Secondary | ICD-10-CM

## 2016-09-18 LAB — POCT INR: INR: 2.7

## 2016-10-16 ENCOUNTER — Ambulatory Visit (INDEPENDENT_AMBULATORY_CARE_PROVIDER_SITE_OTHER): Payer: Medicare Other

## 2016-10-16 DIAGNOSIS — I482 Chronic atrial fibrillation, unspecified: Secondary | ICD-10-CM

## 2016-10-16 DIAGNOSIS — Z5181 Encounter for therapeutic drug level monitoring: Secondary | ICD-10-CM | POA: Diagnosis not present

## 2016-10-16 LAB — POCT INR: INR: 2.8

## 2016-10-28 ENCOUNTER — Ambulatory Visit: Payer: Medicare Other | Admitting: Internal Medicine

## 2016-10-30 ENCOUNTER — Ambulatory Visit: Payer: Medicare Other | Admitting: Internal Medicine

## 2016-11-11 ENCOUNTER — Encounter: Payer: Self-pay | Admitting: Internal Medicine

## 2016-11-11 ENCOUNTER — Other Ambulatory Visit: Payer: Self-pay | Admitting: Urology

## 2016-11-11 DIAGNOSIS — M25561 Pain in right knee: Secondary | ICD-10-CM

## 2016-11-11 DIAGNOSIS — N138 Other obstructive and reflux uropathy: Secondary | ICD-10-CM

## 2016-11-11 DIAGNOSIS — N401 Enlarged prostate with lower urinary tract symptoms: Principal | ICD-10-CM

## 2016-11-12 NOTE — Telephone Encounter (Signed)
Order placed for ortho referral.   

## 2016-11-13 ENCOUNTER — Ambulatory Visit (INDEPENDENT_AMBULATORY_CARE_PROVIDER_SITE_OTHER): Payer: Medicare Other | Admitting: *Deleted

## 2016-11-13 DIAGNOSIS — I482 Chronic atrial fibrillation, unspecified: Secondary | ICD-10-CM

## 2016-11-13 DIAGNOSIS — Z5181 Encounter for therapeutic drug level monitoring: Secondary | ICD-10-CM

## 2016-11-13 LAB — POCT INR: INR: 1.9

## 2016-11-18 ENCOUNTER — Other Ambulatory Visit: Payer: Self-pay | Admitting: Cardiovascular Disease

## 2016-11-19 NOTE — Telephone Encounter (Signed)
Please review for refill, Thanks !  

## 2016-11-22 ENCOUNTER — Telehealth: Payer: Self-pay | Admitting: Cardiovascular Disease

## 2016-11-22 NOTE — Telephone Encounter (Addendum)
Received fax from [facility] that pt's coumadin needs prior authorization. Called [facility] [phone] to initiate.  Pharmacy is running brand name coumadin; generic warfarin does not need PA. Faxed back to [facility] advising them to give generic medication.

## 2016-11-26 ENCOUNTER — Encounter: Payer: Self-pay | Admitting: Internal Medicine

## 2016-11-26 DIAGNOSIS — M2352 Chronic instability of knee, left knee: Secondary | ICD-10-CM | POA: Diagnosis not present

## 2016-11-26 DIAGNOSIS — E669 Obesity, unspecified: Secondary | ICD-10-CM | POA: Diagnosis not present

## 2016-11-26 DIAGNOSIS — M1712 Unilateral primary osteoarthritis, left knee: Secondary | ICD-10-CM | POA: Insufficient documentation

## 2016-11-26 DIAGNOSIS — Z6838 Body mass index (BMI) 38.0-38.9, adult: Secondary | ICD-10-CM | POA: Diagnosis not present

## 2016-11-26 DIAGNOSIS — M25562 Pain in left knee: Secondary | ICD-10-CM | POA: Diagnosis not present

## 2016-11-26 DIAGNOSIS — M2392 Unspecified internal derangement of left knee: Secondary | ICD-10-CM | POA: Diagnosis not present

## 2016-11-26 DIAGNOSIS — G8929 Other chronic pain: Secondary | ICD-10-CM | POA: Diagnosis not present

## 2016-11-26 DIAGNOSIS — M25561 Pain in right knee: Secondary | ICD-10-CM

## 2016-12-01 NOTE — Telephone Encounter (Signed)
I have placed an order for ortho referral.  Pt wants to go to [facility] - persistent right knee pain.  See his my chart message.

## 2016-12-03 DIAGNOSIS — M1712 Unilateral primary osteoarthritis, left knee: Secondary | ICD-10-CM | POA: Insufficient documentation

## 2016-12-03 DIAGNOSIS — M13862 Other specified arthritis, left knee: Secondary | ICD-10-CM | POA: Diagnosis not present

## 2016-12-13 ENCOUNTER — Ambulatory Visit: Payer: Medicare Other | Admitting: Urology

## 2016-12-18 ENCOUNTER — Ambulatory Visit (INDEPENDENT_AMBULATORY_CARE_PROVIDER_SITE_OTHER): Payer: Medicare Other | Admitting: *Deleted

## 2016-12-18 DIAGNOSIS — Z5181 Encounter for therapeutic drug level monitoring: Secondary | ICD-10-CM

## 2016-12-18 DIAGNOSIS — I482 Chronic atrial fibrillation, unspecified: Secondary | ICD-10-CM

## 2016-12-18 LAB — POCT INR: INR: 2.8

## 2016-12-20 ENCOUNTER — Encounter: Payer: Self-pay | Admitting: Internal Medicine

## 2016-12-20 ENCOUNTER — Encounter: Payer: Self-pay | Admitting: Urology

## 2016-12-20 ENCOUNTER — Ambulatory Visit (INDEPENDENT_AMBULATORY_CARE_PROVIDER_SITE_OTHER): Payer: Medicare Other | Admitting: Urology

## 2016-12-20 VITALS — BP 130/82 | HR 78 | Ht 73.0 in | Wt 275.0 lb

## 2016-12-20 DIAGNOSIS — N400 Enlarged prostate without lower urinary tract symptoms: Secondary | ICD-10-CM | POA: Diagnosis not present

## 2016-12-20 NOTE — Progress Notes (Signed)
12/20/2016 11:37 AM   Michael Blevins 03-24-1951 [id number]  Referring provider: Einar Pheasant, MD [address] [place], [place] [address]  Chief Complaint  Patient presents with  . Benign Prostatic Hypertrophy    HPI: 66 year old male who returns today for annual follow-up.  He has a history of BPH, status post gross hematuria workup in 11/2015.  Massive BPH 210 cc now in finasteride since last year.     Overall, he notes dramatic improvement of his voiding symptoms. His urinary stream is improved and physical is able to empty his bladder. He's had no further episodes of gross hematuria since starting finasteride. He is very pleased with the results. IPSS as below, 4/1.      IPSS    Row Name 12/20/16 1100         International Prostate Symptom Score   How often have you had the sensation of not emptying your bladder? Not at All     How often have you had to urinate less than every two hours? Less than half the time     How often have you found you stopped and started again several times when you urinated? Not at All     How often have you found it difficult to postpone urination? Less than 1 in 5 times     How often have you had a weak urinary stream? Not at All     How often have you had to strain to start urination? Not at All     How many times did you typically get up at night to urinate? 1 Time     Total IPSS Score 4       Quality of Life due to urinary symptoms   If you were to spend the rest of your life with your urinary condition just the way it is now how would you feel about that? Pleased        Score:  1-7 Mild 8-19 Moderate 20-35 Severe    PMH: Past Medical History:  Diagnosis Date  . Allergy    cats and pollen  . Asthma   . Atrial fibrillation ([place])   . Back pain   . Cellulitis   . Gout   . History of chicken pox   . Hyperlipidemia   . Hypertension   . Kidney stone   . Sleep apnea   . Venous insufficiency      Surgical History: Past Surgical History:  Procedure Laterality Date  . APPENDECTOMY    . CARDIAC CATHETERIZATION      Home Medications:  Allergies as of 12/20/2016   No Known Allergies     Medication List       Accurate as of 12/20/16 11:37 AM. Always use your most recent med list.          amLODipine 10 MG tablet Commonly known as:  NORVASC Take 1 tablet (10 mg total) by mouth daily.   digoxin 0.125 MG tablet Commonly known as:  LANOXIN Take 1 tablet (0.125 mg total) by mouth daily.   finasteride 5 MG tablet Commonly known as:  PROSCAR TAKE 1 TABLET(5 MG) BY MOUTH DAILY   furosemide 20 MG tablet Commonly known as:  LASIX Take 1 tablet (20 mg total) by mouth 2 (two) times daily as needed.   lisinopril 20 MG tablet Commonly known as:  PRINIVIL,ZESTRIL Take 1 tablet (20 mg total) by mouth daily.   lovastatin 20 MG tablet Commonly known as:  MEVACOR  Take 1 tablet (20 mg total) by mouth at bedtime.   metoprolol tartrate 50 MG tablet Commonly known as:  LOPRESSOR Take 1 tablet (50 mg total) by mouth 2 (two) times daily.   warfarin 2 MG tablet Commonly known as:  COUMADIN Take 2 mg by mouth daily.   warfarin 5 MG tablet Commonly known as:  COUMADIN TAKE 1 TABLET BY MOUTH ON MONDAY AND FRIDAY, AND 1 AND 1/2 TABLETS ON ALL OTHER DAYS       Allergies: No Known Allergies  Family History: Family History  Problem Relation Age of Onset  . Hypertension Mother   . Diabetes Father   . Kidney disease Neg Hx   . Prostate cancer Neg Hx     Social History:  reports that he has quit smoking. He has never used smokeless tobacco. He reports that he does not drink alcohol or use drugs.  ROS: UROLOGY Frequent Urination?: No Hard to postpone urination?: Yes Burning/pain with urination?: No Get up at night to urinate?: No Leakage of urine?: No Urine stream starts and stops?: No Trouble starting stream?: No Do you have to strain to urinate?: No Blood in  urine?: No Urinary tract infection?: No Sexually transmitted disease?: No Injury to kidneys or bladder?: No Painful intercourse?: No Weak stream?: No Erection problems?: No Penile pain?: No  Gastrointestinal Nausea?: No Vomiting?: No Indigestion/heartburn?: No Diarrhea?: No Constipation?: No  Constitutional Fever: No Night sweats?: No Weight loss?: No Fatigue?: No  Skin Skin rash/lesions?: No Itching?: No  Eyes Blurred vision?: No Double vision?: No  Ears/Nose/Throat Sore throat?: No Sinus problems?: No  Hematologic/Lymphatic Swollen glands?: No Easy bruising?: No  Cardiovascular Leg swelling?: No Chest pain?: No  Respiratory Cough?: No Shortness of breath?: No  Endocrine Excessive thirst?: No  Musculoskeletal Back pain?: No Joint pain?: No  Neurological Headaches?: No Dizziness?: No  Psychologic Depression?: No Anxiety?: No  Physical Exam: BP 130/82 (BP Location: Right Arm, Patient Position: Sitting, Cuff Size: Large)   Pulse 78   Ht 6\' 1"  (1.854 m)   Wt 275 lb (124.7 kg)   BMI 36.28 kg/m   Constitutional:  Alert and oriented, No acute distress.  Accompanied by wife today. HEENT: [place] AT, moist mucus membranes.  Trachea midline, no masses. Cardiovascular: No clubbing, cyanosis, or edema. Respiratory: Normal respiratory effort, no increased work of breathing. GI: Abdomen is soft, nontender, nondistended, no abdominal masses GU: No CVA tenderness.  Rectal: Rectal exam somewhat limited due to patient habitus and prostate gland size. Only able to palpate apex of prostate which is unremarkable. Nontender. Skin: No rashes, bruises or suspicious lesions. Neurologic: Grossly intact, no focal deficits, moving all 4 extremities. Psychiatric: Normal mood and affect.  Laboratory Data: Lab Results  Component Value Date   WBC 10.6 (H) 05/20/2016   HGB 16.8 05/20/2016   HCT 48.8 05/20/2016   MCV 86.8 05/20/2016   PLT 192.0 05/20/2016    Lab  Results  Component Value Date   CREATININE 0.94 05/20/2016    Lab Results  Component Value Date   PSA 3.49 11/08/2015   PSA 2.81 08/05/2014    Urinalysis N/a  Pertinent Imaging: n/a  Assessment & Plan:   1. Benign prostatic hyperplasia, unspecified whether lower urinary tract symptoms present Excellent response to finasteride, recommend continuation of this medication chronically If symptoms recur, consider outlet procedure versus addition of finasteride for medical optimization Given that he's done very well, we'll release him back to the care of his PCP unless his  symptoms worsen PSA today- anticipate reduction in PSA due to finasteride effect, will call with results Recommend continuation of annual PSA at least age 55 based on AUA guidelines - PSA  F/u prn  Hollice Espy, MD  [facility] [address], [place], [place] [address] [phone]

## 2016-12-21 LAB — PSA: Prostate Specific Ag, Serum: 1.1 ng/mL (ref 0.0–4.0)

## 2016-12-22 NOTE — Telephone Encounter (Signed)
Michael Blevins, [name] wants to change his appt and put his daughter as an establish care appt instead.  See his my chart message.  This is ok with me.  Do you mind calling him and getting this information.  Thanks

## 2016-12-23 ENCOUNTER — Telehealth: Payer: Self-pay | Admitting: *Deleted

## 2016-12-23 NOTE — Telephone Encounter (Addendum)
Pt daughter has est care with Dr. Lacinda Axon already.. told Michael Blevins that Michael Blevins needed to call

## 2016-12-23 NOTE — Telephone Encounter (Signed)
-----   Message from Hollice Espy, MD sent at 12/21/2016  2:14 PM EDT ----- Appropriate PSA reduction on finasteride. PSA is excellent.  Hollice Espy, MD

## 2016-12-23 NOTE — Telephone Encounter (Signed)
Lm on vm to switch appt

## 2016-12-23 NOTE — Telephone Encounter (Signed)
Tried to call patient, no answer, no machine.

## 2016-12-24 NOTE — Telephone Encounter (Signed)
Patient notified

## 2016-12-31 ENCOUNTER — Encounter: Payer: Self-pay | Admitting: Internal Medicine

## 2016-12-31 ENCOUNTER — Ambulatory Visit (INDEPENDENT_AMBULATORY_CARE_PROVIDER_SITE_OTHER): Payer: Medicare Other | Admitting: Internal Medicine

## 2016-12-31 VITALS — BP 130/78 | HR 70 | Temp 98.6°F | Resp 12 | Ht 73.0 in | Wt 288.4 lb

## 2016-12-31 DIAGNOSIS — R319 Hematuria, unspecified: Secondary | ICD-10-CM | POA: Diagnosis not present

## 2016-12-31 DIAGNOSIS — G4733 Obstructive sleep apnea (adult) (pediatric): Secondary | ICD-10-CM

## 2016-12-31 DIAGNOSIS — I482 Chronic atrial fibrillation, unspecified: Secondary | ICD-10-CM

## 2016-12-31 DIAGNOSIS — Z23 Encounter for immunization: Secondary | ICD-10-CM | POA: Diagnosis not present

## 2016-12-31 DIAGNOSIS — E7800 Pure hypercholesterolemia, unspecified: Secondary | ICD-10-CM | POA: Diagnosis not present

## 2016-12-31 DIAGNOSIS — I10 Essential (primary) hypertension: Secondary | ICD-10-CM

## 2016-12-31 NOTE — Progress Notes (Signed)
Pre-visit discussion using our clinic review tool. No additional management support is needed unless otherwise documented below in the visit note.  

## 2016-12-31 NOTE — Progress Notes (Signed)
Patient ID: Michael Blevins, male   DOB: December 10, 1950, 66 y.o.   MRN: [id number]   Subjective:    Patient ID: Michael Blevins, male    DOB: Jan 19, 1951, 66 y.o.   MRN: [id number]  HPI  Patient here for a scheduled follow up.  States he is doing well.  Is followed by urology.  On finasteride.  Doing well.  Note reviewed.  Tries to stay active.  No chest pain.  No sob.  No acid reflux. No abdominal pain.  Bowels moving.  Saw Dr Sabra Heck for his left knee pain.  Told had arthritis.  Doing exercises.  Helping.  Desires no further intervention.  States blood pressure checks are averaging 130/85.     Past Medical History:  Diagnosis Date  . Allergy    cats and pollen  . Asthma   . Atrial fibrillation ([place])   . Back pain   . Cellulitis   . Gout   . History of chicken pox   . Hyperlipidemia   . Hypertension   . Kidney stone   . Sleep apnea   . Venous insufficiency    Past Surgical History:  Procedure Laterality Date  . APPENDECTOMY    . CARDIAC CATHETERIZATION     Family History  Problem Relation Age of Onset  . Hypertension Mother   . Diabetes Father   . Kidney disease Neg Hx   . Prostate cancer Neg Hx    Social History   Social History  . Marital status: Married    Spouse name: N/A  . Number of children: N/A  . Years of education: N/A   Social History Main Topics  . Smoking status: Former Research scientist (life sciences)  . Smokeless tobacco: Never Used     Comment: quit 33 years  . Alcohol use No  . Drug use: No  . Sexual activity: Not Asked   Other Topics Concern  . None   Social History Narrative  . None    Outpatient Encounter Prescriptions as of 12/31/2016  Medication Sig  . amLODipine (NORVASC) 10 MG tablet Take 1 tablet (10 mg total) by mouth daily.  . digoxin (LANOXIN) 0.125 MG tablet Take 1 tablet (0.125 mg total) by mouth daily.  . finasteride (PROSCAR) 5 MG tablet TAKE 1 TABLET(5 MG) BY MOUTH DAILY  . furosemide (LASIX) 20 MG tablet Take 1 tablet (20 mg total) by mouth 2  (two) times daily as needed.  Marland Kitchen lisinopril (PRINIVIL,ZESTRIL) 20 MG tablet Take 1 tablet (20 mg total) by mouth daily.  Marland Kitchen lovastatin (MEVACOR) 20 MG tablet Take 1 tablet (20 mg total) by mouth at bedtime.  . metoprolol (LOPRESSOR) 50 MG tablet Take 1 tablet (50 mg total) by mouth 2 (two) times daily.  Marland Kitchen warfarin (COUMADIN) 2 MG tablet Take 2 mg by mouth daily.  Marland Kitchen warfarin (COUMADIN) 5 MG tablet TAKE 1 TABLET BY MOUTH ON MONDAY AND FRIDAY, AND 1 AND 1/2 TABLETS ON ALL OTHER DAYS   No facility-administered encounter medications on file as of 12/31/2016.     Review of Systems  Constitutional: Negative for appetite change and unexpected weight change.  HENT: Negative for congestion and sinus pressure.   Respiratory: Negative for cough, chest tightness and shortness of breath.   Cardiovascular: Negative for chest pain, palpitations and leg swelling.  Gastrointestinal: Negative for abdominal pain, diarrhea, nausea and vomiting.  Genitourinary: Negative for difficulty urinating and dysuria.  Musculoskeletal: Negative for myalgias.       Left knee is doing better.  Doing exercises.    Skin: Negative for color change and rash.  Neurological: Negative for dizziness, light-headedness and headaches.  Psychiatric/Behavioral: Negative for agitation and dysphoric mood.       Objective:    Physical Exam  Constitutional: He appears well-developed and well-nourished. No distress.  HENT:  Nose: Nose normal.  Mouth/Throat: Oropharynx is clear and moist.  Neck: Neck supple. No thyromegaly present.  Cardiovascular: Normal rate.   Irregular rhythm.    Pulmonary/Chest: Effort normal and breath sounds normal. No respiratory distress.  Abdominal: Soft. Bowel sounds are normal. There is no tenderness.  Musculoskeletal: He exhibits no edema or tenderness.  Lymphadenopathy:    He has no cervical adenopathy.  Skin: No rash noted. No erythema.  Psychiatric: He has a normal mood and affect. His behavior is  normal.    BP 130/78   Pulse 70   Temp 98.6 F (37 C) (Oral)   Resp 12   Ht 6\' 1"  (1.854 m)   Wt 288 lb 6.4 oz (130.8 kg)   SpO2 95%   BMI 38.05 kg/m  Wt Readings from Last 3 Encounters:  01/02/17 287 lb (130.2 kg)  12/31/16 288 lb 6.4 oz (130.8 kg)  12/20/16 275 lb (124.7 kg)     Lab Results  Component Value Date   WBC 10.6 (H) 05/20/2016   HGB 16.8 05/20/2016   HCT 48.8 05/20/2016   PLT 192.0 05/20/2016   GLUCOSE 104 (H) 05/20/2016   CHOL 130 05/20/2016   TRIG 150.0 (H) 05/20/2016   HDL 34.70 (L) 05/20/2016   LDLCALC 65 05/20/2016   ALT 24 05/20/2016   AST 22 05/20/2016   NA 141 05/20/2016   K 4.6 05/20/2016   CL 104 05/20/2016   CREATININE 0.94 05/20/2016   BUN 14 05/20/2016   CO2 33 (H) 05/20/2016   TSH 2.45 11/08/2015   PSA 3.49 11/08/2015   INR 2.8 12/18/2016    Ct Abdomen Pelvis W Wo Contrast  Result Date: 12/08/2015 CLINICAL DATA:  Patient had two episodes of painless gross hematuria 18 months apart. Patient doesn't have a prior history of hematuria. Hx Kidney stones and BPH. EXAM: CT ABDOMEN AND PELVIS WITHOUT AND WITH CONTRAST TECHNIQUE: Multidetector CT imaging of the abdomen and pelvis was performed following the standard protocol before and following the bolus administration of intravenous contrast. CONTRAST:  179mL ISOVUE-300 IOPAMIDOL (ISOVUE-300) INJECTION 61% COMPARISON:  CT 10/07/2011 FINDINGS: Lower chest: Lung bases are clear. Hepatobiliary: Normal liver. Small gallstone present. No biliary duct dilatation. Common bile duct normal. Pancreas: Pancreas is normal. No ductal dilatation. No pancreatic inflammation. Spleen: Normal spleen Adrenals/urinary tract: Adrenal glands are normal. No nephrolithiasis for ureterolithiasis. No enhancing renal cortical lesion. No filling defect within the collecting systems or ureters. No bladder calculi or enhancing bladder lesion. Enlarged prostate gland elevates the base the bladder. One nodular filling defect base  the bladder measures 6 mm (image 89, series 12). This is adjacent to and likely an extension of the prostate nodular hypertrophy. Stomach/Bowel: Stomach, small bowel, appendix, and cecum are normal. The colon and rectosigmoid colon are normal. Vascular/Lymphatic: Abdominal aorta is normal caliber. There is no retroperitoneal or periportal lymphadenopathy. No pelvic lymphadenopathy. Reproductive: Prostate gland is enlarged and elevates base the bladder as above. Prostate gland measures 71 mm in diameter Other: No free fluid. Musculoskeletal: No aggressive osseous lesion. IMPRESSION: 1. No explanation for hematuria. No nephrolithiasis, ureterolithiasis, enhancing renal cortical lesion, or filling defects within the collecting systems. 2. Enlarged prostate gland elevates base  of bladder. One filling defect at the base the bladder is favored an extension of the prostate hypertrophy but not cannot completely exclude a papillary bladder lesion. Electronically Signed   By: Suzy Bouchard M.D.   On: 12/08/2015 09:20       Assessment & Plan:   Problem List Items Addressed This Visit    Atrial fibrillation ([place])    On coumadin.  Followed by cardiology.  Stable.       Blood in the urine    Has been worked up by urology.  Last evaluated 12/20/16.  On finasteride.  Follow.        Essential hypertension    Blood pressure has been well controlled.  Recheck improved.  Follow pressures.  Follow metabolic panel.        Relevant Orders   CBC with Differential/Platelet   TSH   Basic metabolic panel   Hypercholesterolemia    On lovastatin.  Low cholesterol diet and exercise.  Follow lipid panel and liver function tests.        Relevant Orders   Hepatic function panel   Lipid panel   Obstructive sleep apnea    CPAP.        Other Visit Diagnoses    Need for shingles vaccine    -  Primary       Einar Pheasant, MD

## 2017-01-01 NOTE — Progress Notes (Signed)
Cardiology Office Note  Date:  01/02/2017   ID:  Michael Blevins, DOB Jul 20, 1950, MRN [id number]  PCP:  Einar Pheasant, MD   Chief Complaint  Patient presents with  . other    6 month follow up. Patient States he is doing well. Meds reviewed verbally with patient.     HPI:  Michael Blevins is a 66 year old gentleman with history of  Remote history of smoking for 17 years Obesity, Paroxysmal atrial fibrillation,  cardioversion in 2011, did not hold, on chronic anticoagulation ,  sleep apnea on CPAP,   Essential hypertension,  Hyperlipidemia,  Small atrial septal defect ( QP/QS 1.08 ),  Venous insufficiency,  prior cardiac catheterization in 2011 by report showing no significant coronary disease,  who presents for follow-up of his paroxysmal atrial fibrillation, AST, hyperlipidemia  In follow-up today he reports he is doing well No symptoms of shortness of breath or chest discomfort Active, stable right lower terminate edema, wears compression hose Initially was taking Lasix as needed, now taking Lasix 20 twice a day Scheduled for lab work in 2 weeks with primary care Compliant with his CPAP  Lab work reviewed with him Total chol 130, LDL   tolerating warfarin, does not want to change to one of the other medications  EKG personally reviewed by myself on todays visit Shows Atrial fibrillation with ventricular rate 75 bpm, rare PVC  Other past medical history reviewed Previously seen at [facility], Records reviewed including stress echocardiogram 06/14/2016 performed for shortness of breath, exercised 6 minutes on a Bruce protocol without chest pain or ECG changes. Echo showed no wall motion abnormality concerning for ischemia  Limited echocardiogram revealed normal left ventricular function, with LV ejection fraction greater than 55%. My review of the study showed details of moderate TR, mild to moderately elevated right heart pressures, no mention of his left atrial or right  atrial size, no mention of atrial septal defect   history of paroxysmal atrial fibrillation, chads Vasc score 4, currently on warfarin, He is interested in moving to our anticoagulation clinic    history of essential hypertension,  He reports that his blood pressure is well controlled on amlodipine, furosemide, lisinopril and metoprolol tartrate.    he has chronic mild lower extremity edema, nonpitting , per the notes felt to be secondary to venous insufficiency   negative cardiac cath 2011  . Sleep apnea    PMH :   has a past medical history of Allergy; Asthma; Atrial fibrillation ([place]); Back pain; Cellulitis; Gout; History of chicken pox; Hyperlipidemia; Hypertension; Kidney stone; Sleep apnea; and Venous insufficiency.  PSH:    Past Surgical History:  Procedure Laterality Date  . APPENDECTOMY    . CARDIAC CATHETERIZATION      Current Outpatient Prescriptions  Medication Sig Dispense Refill  . amLODipine (NORVASC) 10 MG tablet Take 1 tablet (10 mg total) by mouth daily. 90 tablet 3  . digoxin (LANOXIN) 0.125 MG tablet Take 1 tablet (0.125 mg total) by mouth daily. 90 tablet 3  . finasteride (PROSCAR) 5 MG tablet TAKE 1 TABLET(5 MG) BY MOUTH DAILY 90 tablet 0  . furosemide (LASIX) 20 MG tablet Take 1 tablet (20 mg total) by mouth 2 (two) times daily as needed. 180 tablet 0  . lisinopril (PRINIVIL,ZESTRIL) 20 MG tablet Take 1 tablet (20 mg total) by mouth daily. 90 tablet 3  . lovastatin (MEVACOR) 20 MG tablet Take 1 tablet (20 mg total) by mouth at bedtime. 90 tablet 3  . metoprolol (LOPRESSOR)  50 MG tablet Take 1 tablet (50 mg total) by mouth 2 (two) times daily. 180 tablet 3  . warfarin (COUMADIN) 2 MG tablet Take 2 mg by mouth daily.    Marland Kitchen warfarin (COUMADIN) 5 MG tablet TAKE 1 TABLET BY MOUTH ON MONDAY AND FRIDAY, AND 1 AND 1/2 TABLETS ON ALL OTHER DAYS 30 tablet 2   No current facility-administered medications for this visit.      Allergies:   Patient has no known  allergies.   Social History:  The patient  reports that he has quit smoking. He has never used smokeless tobacco. He reports that he does not drink alcohol or use drugs.   Family History:   family history includes Diabetes in his father; Hypertension in his mother.    Review of Systems: Review of Systems  Constitutional: Negative.   Respiratory: Negative.   Cardiovascular: Positive for leg swelling.  Gastrointestinal: Negative.   Musculoskeletal: Negative.   Neurological: Negative.   Psychiatric/Behavioral: Negative.   All other systems reviewed and are negative.    PHYSICAL EXAM: VS:  There were no vitals taken for this visit. , BMI There is no height or weight on file to calculate BMI. GEN: Well nourished, well developed, in no acute distress ,  obese HEENT: normal  Neck: no JVD, carotid bruits, or masses Cardiac: RRR; no murmurs, rubs, or gallops,no significant pitting  edema ,  he does have trace nonpitting edema Respiratory:  clear to auscultation bilaterally, normal work of breathing GI: soft, nontender, nondistended, + BS MS: no deformity or atrophy  Skin: warm and dry, no rash Neuro:  Strength and sensation are intact Psych: euthymic mood, full affect    Recent Labs: 05/20/2016: ALT 24; BUN 14; Creatinine, Ser 0.94; Hemoglobin 16.8; Platelets 192.0; Potassium 4.6; Sodium 141    Lipid Panel Lab Results  Component Value Date   CHOL 130 05/20/2016   HDL 34.70 (L) 05/20/2016   LDLCALC 65 05/20/2016   TRIG 150.0 (H) 05/20/2016      Wt Readings from Last 3 Encounters:  12/31/16 288 lb 6.4 oz (130.8 kg)  12/20/16 275 lb (124.7 kg)  07/05/16 283 lb 12 oz (128.7 kg)       ASSESSMENT AND PLAN: Chronic atrial fibrillation (HCC) - Plan: EKG 12-Lead Tolerating anticoagulation, rate well controlled CHADS VASC 4  Essential hypertension - Plan: EKG 12-Lead Blood pressure is well controlled on today's visit. No changes made to the medications. Blood Work pending  through primary care  Hypercholesterolemia Cholesterol is at goal on the current lipid regimen. No changes to the medications were made.  Venous insufficiency Faithfully wears his compression hose Prior history of vein surgery  Encounter for anticoagulation discussion and counseling Tolerating warfarin, does not want to change to other agents such as NOAC   Obstructive sleep apnea On CPAP  Pulmonary hypertension  Previously with elevated right heart pressures seen on echocardiogram outside our office . Pressure likely 45 up to 50 mmHg  He has been taking Lasix 20 twice a day Lab work pending  Non-rheumatic tricuspid valve insufficiency moderate TR per the previous echo, no murmur appreciated on exam today   Morbid obesity ([place]) We have encouraged continued exercise, careful diet management in an effort to lose weight.  Atrial septal defect  no recent mention of atrial septal defect on limited echocardiogram done at outside office Would likely recommend repeat full echocardiogram on his next visit   Total encounter time more than 25 minutes  Greater than  50% was spent in counseling and coordination of care with the patient   Disposition:   F/U  12 months    No orders of the defined types were placed in this encounter.    Signed, Esmond Plants, M.D., Ph.D. 01/02/2017  [place], [place]

## 2017-01-02 ENCOUNTER — Encounter: Payer: Self-pay | Admitting: Cardiovascular Disease

## 2017-01-02 ENCOUNTER — Ambulatory Visit (INDEPENDENT_AMBULATORY_CARE_PROVIDER_SITE_OTHER): Payer: Medicare Other | Admitting: Cardiovascular Disease

## 2017-01-02 VITALS — BP 128/80 | HR 75 | Ht 73.0 in | Wt 287.0 lb

## 2017-01-02 DIAGNOSIS — I482 Chronic atrial fibrillation, unspecified: Secondary | ICD-10-CM

## 2017-01-02 DIAGNOSIS — G4733 Obstructive sleep apnea (adult) (pediatric): Secondary | ICD-10-CM

## 2017-01-02 DIAGNOSIS — Q211 Atrial septal defect, unspecified: Secondary | ICD-10-CM

## 2017-01-02 DIAGNOSIS — E7800 Pure hypercholesterolemia, unspecified: Secondary | ICD-10-CM | POA: Diagnosis not present

## 2017-01-02 DIAGNOSIS — I10 Essential (primary) hypertension: Secondary | ICD-10-CM

## 2017-01-02 NOTE — Patient Instructions (Addendum)

## 2017-01-03 ENCOUNTER — Encounter: Payer: Self-pay | Admitting: Internal Medicine

## 2017-01-03 NOTE — Assessment & Plan Note (Signed)
Blood pressure has been well controlled.  Recheck improved.  Follow pressures.  Follow metabolic panel.

## 2017-01-03 NOTE — Assessment & Plan Note (Addendum)
Has been worked up by urology.  Last evaluated 12/20/16.  On finasteride.  Follow.

## 2017-01-03 NOTE — Assessment & Plan Note (Signed)
On coumadin.  Followed by cardiology.  Stable.  

## 2017-01-03 NOTE — Assessment & Plan Note (Signed)
CPAP.  

## 2017-01-03 NOTE — Assessment & Plan Note (Signed)
On lovastatin.  Low cholesterol diet and exercise.  Follow lipid panel and liver function tests.   

## 2017-01-07 DIAGNOSIS — D235 Other benign neoplasm of skin of trunk: Secondary | ICD-10-CM | POA: Diagnosis not present

## 2017-01-07 DIAGNOSIS — L570 Actinic keratosis: Secondary | ICD-10-CM | POA: Diagnosis not present

## 2017-01-07 DIAGNOSIS — D3611 Benign neoplasm of peripheral nerves and autonomic nervous system of face, head, and neck: Secondary | ICD-10-CM | POA: Diagnosis not present

## 2017-01-07 DIAGNOSIS — L718 Other rosacea: Secondary | ICD-10-CM | POA: Diagnosis not present

## 2017-01-07 DIAGNOSIS — D2372 Other benign neoplasm of skin of left lower limb, including hip: Secondary | ICD-10-CM | POA: Diagnosis not present

## 2017-01-07 DIAGNOSIS — X32XXXA Exposure to sunlight, initial encounter: Secondary | ICD-10-CM | POA: Diagnosis not present

## 2017-01-21 ENCOUNTER — Other Ambulatory Visit (INDEPENDENT_AMBULATORY_CARE_PROVIDER_SITE_OTHER): Payer: Medicare Other

## 2017-01-21 ENCOUNTER — Other Ambulatory Visit: Payer: Self-pay | Admitting: Internal Medicine

## 2017-01-21 DIAGNOSIS — E7800 Pure hypercholesterolemia, unspecified: Secondary | ICD-10-CM

## 2017-01-21 DIAGNOSIS — I10 Essential (primary) hypertension: Secondary | ICD-10-CM | POA: Diagnosis not present

## 2017-01-21 DIAGNOSIS — R945 Abnormal results of liver function studies: Secondary | ICD-10-CM

## 2017-01-21 DIAGNOSIS — D582 Other hemoglobinopathies: Secondary | ICD-10-CM

## 2017-01-21 DIAGNOSIS — R7989 Other specified abnormal findings of blood chemistry: Secondary | ICD-10-CM

## 2017-01-21 LAB — CBC WITH DIFFERENTIAL/PLATELET
BASOS ABS: 0.1 10*3/uL (ref 0.0–0.1)
Basophils Relative: 0.9 % (ref 0.0–3.0)
EOS ABS: 0.3 10*3/uL (ref 0.0–0.7)
Eosinophils Relative: 2.7 % (ref 0.0–5.0)
HCT: 52.5 % — ABNORMAL HIGH (ref 39.0–52.0)
Hemoglobin: 17.3 g/dL — ABNORMAL HIGH (ref 13.0–17.0)
LYMPHS ABS: 2.7 10*3/uL (ref 0.7–4.0)
Lymphocytes Relative: 27.2 % (ref 12.0–46.0)
MCHC: 33 g/dL (ref 30.0–36.0)
MCV: 90.4 fl (ref 78.0–100.0)
MONO ABS: 0.8 10*3/uL (ref 0.1–1.0)
Monocytes Relative: 7.7 % (ref 3.0–12.0)
NEUTROS PCT: 61.5 % (ref 43.0–77.0)
Neutro Abs: 6 10*3/uL (ref 1.4–7.7)
Platelets: 203 10*3/uL (ref 150.0–400.0)
RBC: 5.81 Mil/uL (ref 4.22–5.81)
RDW: 13.7 % (ref 11.5–15.5)
WBC: 9.8 10*3/uL (ref 4.0–10.5)

## 2017-01-21 LAB — BASIC METABOLIC PANEL
BUN: 12 mg/dL (ref 6–23)
CALCIUM: 9.3 mg/dL (ref 8.4–10.5)
CO2: 32 mEq/L (ref 19–32)
CREATININE: 0.88 mg/dL (ref 0.40–1.50)
Chloride: 105 mEq/L (ref 96–112)
GFR: 91.88 mL/min (ref >60.00)
GLUCOSE: 101 mg/dL — AB (ref 70–99)
Potassium: 4.3 mEq/L (ref 3.5–5.1)
Sodium: 142 mEq/L (ref 135–145)

## 2017-01-21 LAB — LIPID PANEL
CHOLESTEROL: 114 mg/dL (ref 0–200)
HDL: 34.8 mg/dL — AB (ref >39.00)
LDL Cholesterol: 50 mg/dL (ref 0–99)
NONHDL: 78.77
Total CHOL/HDL Ratio: 3
Triglycerides: 144 mg/dL (ref 0.0–149.0)
VLDL: 28.8 mg/dL (ref 0.0–40.0)

## 2017-01-21 LAB — HEPATIC FUNCTION PANEL
ALBUMIN: 4.2 g/dL (ref 3.5–5.2)
ALK PHOS: 53 U/L (ref 39–117)
ALT: 32 U/L (ref 0–53)
AST: 47 U/L — AB (ref 0–37)
BILIRUBIN TOTAL: 1.1 mg/dL (ref 0.2–1.2)
Bilirubin, Direct: 0.4 mg/dL — ABNORMAL HIGH (ref 0.0–0.3)
Total Protein: 6.6 g/dL (ref 6.0–8.3)

## 2017-01-21 LAB — TSH: TSH: 2.41 u[IU]/mL (ref 0.35–4.50)

## 2017-01-21 NOTE — Progress Notes (Signed)
Order placed for f/u labs.  

## 2017-01-29 ENCOUNTER — Other Ambulatory Visit (INDEPENDENT_AMBULATORY_CARE_PROVIDER_SITE_OTHER): Payer: Medicare Other

## 2017-01-29 ENCOUNTER — Ambulatory Visit (INDEPENDENT_AMBULATORY_CARE_PROVIDER_SITE_OTHER): Payer: Medicare Other | Admitting: *Deleted

## 2017-01-29 ENCOUNTER — Encounter: Payer: Self-pay | Admitting: Internal Medicine

## 2017-01-29 DIAGNOSIS — D582 Other hemoglobinopathies: Secondary | ICD-10-CM

## 2017-01-29 DIAGNOSIS — R945 Abnormal results of liver function studies: Secondary | ICD-10-CM

## 2017-01-29 DIAGNOSIS — I482 Chronic atrial fibrillation, unspecified: Secondary | ICD-10-CM

## 2017-01-29 DIAGNOSIS — Z5181 Encounter for therapeutic drug level monitoring: Secondary | ICD-10-CM

## 2017-01-29 DIAGNOSIS — R7989 Other specified abnormal findings of blood chemistry: Secondary | ICD-10-CM

## 2017-01-29 LAB — HEMOGLOBIN: HEMOGLOBIN: 16.2 g/dL (ref 13.0–17.0)

## 2017-01-29 LAB — POCT INR: INR: 3

## 2017-01-29 LAB — HEPATIC FUNCTION PANEL
ALT: 31 U/L (ref 0–53)
AST: 26 U/L (ref 0–37)
Albumin: 4 g/dL (ref 3.5–5.2)
Alkaline Phosphatase: 54 U/L (ref 39–117)
BILIRUBIN TOTAL: 0.9 mg/dL (ref 0.2–1.2)
Bilirubin, Direct: 0.2 mg/dL (ref 0.0–0.3)
Total Protein: 7 g/dL (ref 6.0–8.3)

## 2017-02-04 ENCOUNTER — Other Ambulatory Visit: Payer: Self-pay | Admitting: Urology

## 2017-02-04 DIAGNOSIS — N401 Enlarged prostate with lower urinary tract symptoms: Principal | ICD-10-CM

## 2017-02-04 DIAGNOSIS — N138 Other obstructive and reflux uropathy: Secondary | ICD-10-CM

## 2017-02-17 ENCOUNTER — Other Ambulatory Visit: Payer: Self-pay | Admitting: Cardiovascular Disease

## 2017-02-17 NOTE — Telephone Encounter (Signed)
Refill Request.  

## 2017-03-10 ENCOUNTER — Ambulatory Visit (INDEPENDENT_AMBULATORY_CARE_PROVIDER_SITE_OTHER): Payer: Medicare Other

## 2017-03-10 DIAGNOSIS — Z5181 Encounter for therapeutic drug level monitoring: Secondary | ICD-10-CM

## 2017-03-10 DIAGNOSIS — I482 Chronic atrial fibrillation, unspecified: Secondary | ICD-10-CM

## 2017-03-10 LAB — POCT INR: INR: 3.3

## 2017-03-17 DIAGNOSIS — R7301 Impaired fasting glucose: Secondary | ICD-10-CM | POA: Insufficient documentation

## 2017-03-18 ENCOUNTER — Ambulatory Visit (INDEPENDENT_AMBULATORY_CARE_PROVIDER_SITE_OTHER): Payer: Medicare Other | Admitting: Podiatry

## 2017-03-18 ENCOUNTER — Encounter: Payer: Self-pay | Admitting: Podiatry

## 2017-03-18 VITALS — BP 149/87 | HR 62 | Temp 97.7°F

## 2017-03-18 DIAGNOSIS — L600 Ingrowing nail: Secondary | ICD-10-CM

## 2017-03-18 NOTE — Patient Instructions (Signed)

## 2017-03-24 ENCOUNTER — Other Ambulatory Visit: Payer: Self-pay | Admitting: Cardiovascular Disease

## 2017-03-24 NOTE — Progress Notes (Signed)
   Subjective: Patient presents today for evaluation of intermittent pain to the medial border of the left great toe that has been ongoing for several years. He reports associated discoloration of the lateral side of the nail. Patient is concerned for possible ingrown nail. Patient states that the pain has been present for a few weeks now. Patient presents today for further treatment and evaluation.   Past Medical History:  Diagnosis Date  . Allergy    cats and pollen  . Asthma   . Atrial fibrillation ([place])   . Back pain   . Cellulitis   . Gout   . History of chicken pox   . Hyperlipidemia   . Hypertension   . Kidney stone   . Sleep apnea   . Venous insufficiency     Objective:  General: Well developed, nourished, in no acute distress, alert and oriented x3   Dermatology: Skin is warm, dry and supple bilateral. Medial border left great toe appears to be erythematous with evidence of an ingrowing nail. Pain on palpation noted to the border of the nail fold. The remaining nails appear unremarkable at this time. There are no open sores, lesions.  Vascular: Dorsalis Pedis artery and Posterior Tibial artery pedal pulses palpable. No lower extremity edema noted.   Neruologic: Grossly intact via light touch bilateral.  Musculoskeletal: Muscular strength within normal limits in all groups bilateral. Normal range of motion noted to all pedal and ankle joints.   Assesement: #1 Paronychia with ingrowing nail medial border left great toe #2 Pain in toe #3 Incurvated nail  Plan of Care:  1. Patient evaluated.  2. Discussed treatment alternatives and plan of care. Explained nail avulsion procedure and post procedure course to patient. 3. Patient opted for permanent partial nail avulsion.  4. Prior to procedure, local anesthesia infiltration utilized using 3 ml of a 50:50 mixture of 2% plain lidocaine and 0.5% plain marcaine in a normal hallux block fashion and a betadine prep performed.    5. Partial permanent nail avulsion with chemical matrixectomy performed using [id number] applications of phenol followed by alcohol flush.  6. Light dressing applied. 7. Return to clinic in 2 weeks.   Edrick Kins, DPM [facility]  Dr. Edrick Kins, [address]                                        [place], [place] [address]                Office [phone]  Fax [phone]

## 2017-04-01 ENCOUNTER — Ambulatory Visit (INDEPENDENT_AMBULATORY_CARE_PROVIDER_SITE_OTHER): Payer: Medicare Other | Admitting: Podiatry

## 2017-04-01 ENCOUNTER — Encounter: Payer: Self-pay | Admitting: Podiatry

## 2017-04-01 DIAGNOSIS — L600 Ingrowing nail: Secondary | ICD-10-CM

## 2017-04-03 NOTE — Progress Notes (Signed)
   Subjective: Patient presents today 2 weeks post ingrown nail permanent nail avulsion procedure. Patient states that the toe and nail fold is feeling much better.  Past Medical History:  Diagnosis Date  . Allergy    cats and pollen  . Asthma   . Atrial fibrillation ([place])   . Back pain   . Cellulitis   . Gout   . History of chicken pox   . Hyperlipidemia   . Hypertension   . Kidney stone   . Sleep apnea   . Venous insufficiency     Objective: Skin is warm, dry and supple. Nail and respective nail fold appears to be healing appropriately. Open wound to the associated nail fold with a granular wound base and moderate amount of fibrotic tissue. Minimal drainage noted. Mild erythema around the periungual region likely due to phenol chemical matricectomy.  Assessment: #1 postop permanent partial nail avulsion Medial border left great toe #2 open wound periungual nail fold of respective digit.   Plan of care: #1 patient was evaluated  #2 debridement of open wound was performed to the periungual border of the respective toe using a currette. Antibiotic ointment and Band-Aid was applied. #3 patient is to return to clinic on a PRN  basis.   Edrick Kins, DPM [facility]  Dr. Edrick Kins, [address]                                        [place], [place] [address]                Office [phone]  Fax [phone]

## 2017-04-07 ENCOUNTER — Ambulatory Visit (INDEPENDENT_AMBULATORY_CARE_PROVIDER_SITE_OTHER): Payer: Medicare Other

## 2017-04-07 DIAGNOSIS — Z5181 Encounter for therapeutic drug level monitoring: Secondary | ICD-10-CM | POA: Diagnosis not present

## 2017-04-07 DIAGNOSIS — I482 Chronic atrial fibrillation, unspecified: Secondary | ICD-10-CM

## 2017-04-07 LAB — POCT INR: INR: 2.8

## 2017-04-21 ENCOUNTER — Other Ambulatory Visit: Payer: Self-pay | Admitting: Cardiovascular Disease

## 2017-04-21 NOTE — Telephone Encounter (Signed)
Please review for refill, Thanks !  

## 2017-05-05 ENCOUNTER — Other Ambulatory Visit: Payer: Self-pay | Admitting: Urology

## 2017-05-05 DIAGNOSIS — N138 Other obstructive and reflux uropathy: Secondary | ICD-10-CM

## 2017-05-05 DIAGNOSIS — N401 Enlarged prostate with lower urinary tract symptoms: Principal | ICD-10-CM

## 2017-05-12 ENCOUNTER — Ambulatory Visit (INDEPENDENT_AMBULATORY_CARE_PROVIDER_SITE_OTHER): Payer: Medicare Other

## 2017-05-12 DIAGNOSIS — I482 Chronic atrial fibrillation, unspecified: Secondary | ICD-10-CM

## 2017-05-12 DIAGNOSIS — Z5181 Encounter for therapeutic drug level monitoring: Secondary | ICD-10-CM

## 2017-05-12 LAB — POCT INR: INR: 3.2

## 2017-05-12 MED ORDER — WARFARIN SODIUM 5 MG PO TABS: 5.0000 mg | ORAL_TABLET | ORAL | 0 refills | Status: DC

## 2017-05-12 NOTE — Patient Instructions (Signed)
Please take 2 mg pill today, then continue on same dosage 5mg  daily except 7mg  on Fridays.   Recheck on January 2 - before you leave for [place].

## 2017-05-19 ENCOUNTER — Other Ambulatory Visit: Payer: Self-pay | Admitting: Cardiovascular Disease

## 2017-05-21 NOTE — Telephone Encounter (Signed)
Please review for refill, Thanks !  

## 2017-06-11 ENCOUNTER — Ambulatory Visit (INDEPENDENT_AMBULATORY_CARE_PROVIDER_SITE_OTHER): Payer: Medicare Other

## 2017-06-11 DIAGNOSIS — Z5181 Encounter for therapeutic drug level monitoring: Secondary | ICD-10-CM | POA: Diagnosis not present

## 2017-06-11 DIAGNOSIS — I482 Chronic atrial fibrillation, unspecified: Secondary | ICD-10-CM

## 2017-06-11 LAB — POCT INR: INR: 3.7

## 2017-06-11 NOTE — Patient Instructions (Signed)
Please skip coumadin today, then continue on same dosage 5mg  daily except 7mg  on Fridays.   Please be consistent with your greens. Recheck after you return from [place] on 07/21/17.

## 2017-07-21 ENCOUNTER — Ambulatory Visit (INDEPENDENT_AMBULATORY_CARE_PROVIDER_SITE_OTHER): Payer: Medicare Other

## 2017-07-21 DIAGNOSIS — I482 Chronic atrial fibrillation, unspecified: Secondary | ICD-10-CM

## 2017-07-21 DIAGNOSIS — Z5181 Encounter for therapeutic drug level monitoring: Secondary | ICD-10-CM

## 2017-07-21 LAB — POCT INR: INR: 2.6

## 2017-07-21 NOTE — Patient Instructions (Signed)
Please skip coumadin today, then continue on same dosage 5mg  daily except 7mg  on Fridays.   Please be consistent with your greens. Recheck in 5 weeks.

## 2017-07-29 ENCOUNTER — Other Ambulatory Visit: Payer: Self-pay | Admitting: Urology

## 2017-07-29 DIAGNOSIS — N401 Enlarged prostate with lower urinary tract symptoms: Principal | ICD-10-CM

## 2017-07-29 DIAGNOSIS — N138 Other obstructive and reflux uropathy: Secondary | ICD-10-CM

## 2017-07-31 ENCOUNTER — Ambulatory Visit (INDEPENDENT_AMBULATORY_CARE_PROVIDER_SITE_OTHER): Payer: Medicare Other | Admitting: Internal Medicine

## 2017-07-31 ENCOUNTER — Other Ambulatory Visit: Payer: Self-pay | Admitting: Cardiovascular Disease

## 2017-07-31 ENCOUNTER — Encounter: Payer: Self-pay | Admitting: Internal Medicine

## 2017-07-31 VITALS — BP 130/82 | HR 57 | Temp 98.2°F | Resp 16 | Wt 297.8 lb

## 2017-07-31 DIAGNOSIS — I482 Chronic atrial fibrillation, unspecified: Secondary | ICD-10-CM

## 2017-07-31 DIAGNOSIS — E7800 Pure hypercholesterolemia, unspecified: Secondary | ICD-10-CM

## 2017-07-31 DIAGNOSIS — D582 Other hemoglobinopathies: Secondary | ICD-10-CM | POA: Diagnosis not present

## 2017-07-31 DIAGNOSIS — G4733 Obstructive sleep apnea (adult) (pediatric): Secondary | ICD-10-CM | POA: Diagnosis not present

## 2017-07-31 DIAGNOSIS — I10 Essential (primary) hypertension: Secondary | ICD-10-CM

## 2017-07-31 DIAGNOSIS — R739 Hyperglycemia, unspecified: Secondary | ICD-10-CM

## 2017-07-31 DIAGNOSIS — R42 Dizziness and giddiness: Secondary | ICD-10-CM | POA: Diagnosis not present

## 2017-07-31 NOTE — Telephone Encounter (Signed)
Refill Request.  

## 2017-07-31 NOTE — Progress Notes (Signed)
Patient ID: Michael Blevins, male   DOB: Jun 25, 1950, 67 y.o.   MRN: [id number]   Subjective:    Patient ID: Michael Blevins, male    DOB: November 09, 1950, 67 y.o.   MRN: [id number]  HPI  Patient here for a scheduled follow up.   He reports he is doing well.  Knee is doing better. Left shoulder better.  Has been doing a lot or walking on a recent vacation.  No chest pain.  No sob.  No acid reflux.  No abdominal pain.  Bowels moving.  Does report some persistent intermittent dizziness.  No associated symptoms.     Past Medical History:  Diagnosis Date  . Allergy    cats and pollen  . Asthma   . Atrial fibrillation ([place])   . Back pain   . Cellulitis   . Gout   . History of chicken pox   . Hyperlipidemia   . Hypertension   . Kidney stone   . Sleep apnea   . Venous insufficiency    Past Surgical History:  Procedure Laterality Date  . APPENDECTOMY    . CARDIAC CATHETERIZATION     Family History  Problem Relation Age of Onset  . Hypertension Mother   . Diabetes Father   . Kidney disease Neg Hx   . Prostate cancer Neg Hx    Social History   Socioeconomic History  . Marital status: Married    Spouse name: None  . Number of children: None  . Years of education: None  . Highest education level: None  Social Needs  . Financial resource strain: None  . Food insecurity - worry: None  . Food insecurity - inability: None  . Transportation needs - medical: None  . Transportation needs - non-medical: None  Occupational History  . None  Tobacco Use  . Smoking status: Former Research scientist (life sciences)  . Smokeless tobacco: Never Used  . Tobacco comment: quit 33 years  Substance and Sexual Activity  . Alcohol use: No    Alcohol/week: 0.0 oz  . Drug use: No  . Sexual activity: None  Other Topics Concern  . None  Social History Narrative  . None    Outpatient Encounter Medications as of 07/31/2017  Medication Sig  . finasteride (PROSCAR) 5 MG tablet TAKE 1 TABLET(5 MG) BY MOUTH DAILY  .  furosemide (LASIX) 20 MG tablet Take 1 tablet (20 mg total) by mouth 2 (two) times daily as needed.  Marland Kitchen lisinopril (PRINIVIL,ZESTRIL) 20 MG tablet Take 1 tablet (20 mg total) by mouth daily.  Marland Kitchen lovastatin (MEVACOR) 20 MG tablet Take 1 tablet (20 mg total) by mouth at bedtime.  . metoprolol (LOPRESSOR) 50 MG tablet Take 1 tablet (50 mg total) by mouth 2 (two) times daily.  Marland Kitchen warfarin (COUMADIN) 2 MG tablet Take 2 mg by mouth daily.  . [DISCONTINUED] amLODipine (NORVASC) 10 MG tablet Take 1 tablet (10 mg total) by mouth daily.  . [DISCONTINUED] digoxin (LANOXIN) 0.125 MG tablet Take 1 tablet (0.125 mg total) by mouth daily.  . [DISCONTINUED] warfarin (COUMADIN) 5 MG tablet Take 1 tablet (5 mg total) by mouth as directed. By the coumadin clinic.  . [DISCONTINUED] warfarin (COUMADIN) 5 MG tablet TAKE 1 TABLET BY MOUTH ON MONDAY AND FRIDAY AND 1 AND 1/2 TABLETS ON ALL OTHER DAYS (Patient not taking: Reported on 07/31/2017)   No facility-administered encounter medications on file as of 07/31/2017.     Review of Systems  Constitutional: Negative for appetite change and  unexpected weight change.  HENT: Negative for congestion and sinus pressure.   Respiratory: Negative for cough, chest tightness and shortness of breath.   Cardiovascular: Negative for chest pain, palpitations and leg swelling.  Gastrointestinal: Negative for abdominal pain, diarrhea, nausea and vomiting.  Genitourinary: Negative for difficulty urinating and dysuria.  Musculoskeletal: Negative for joint swelling and myalgias.  Skin: Negative for color change and rash.  Neurological: Positive for dizziness and light-headedness. Negative for headaches.  Psychiatric/Behavioral: Negative for agitation and dysphoric mood.       Objective:     Blood pressure rechecked by me:  130/82  Physical Exam  Constitutional: He appears well-developed and well-nourished. No distress.  HENT:  Nose: Nose normal.  Mouth/Throat: Oropharynx is clear  and moist.  Neck: Neck supple. No thyromegaly present.  Cardiovascular: Normal rate and regular rhythm.  Pulmonary/Chest: Effort normal and breath sounds normal. No respiratory distress.  Abdominal: Soft. Bowel sounds are normal. There is no tenderness.  Musculoskeletal: He exhibits no edema or tenderness.  Lymphadenopathy:    He has no cervical adenopathy.  Skin: No rash noted. No erythema.  Psychiatric: He has a normal mood and affect. His behavior is normal.    BP 130/82   Pulse (!) 57   Temp 98.2 F (36.8 C) (Oral)   Resp 16   Wt 297 lb 12.8 oz (135.1 kg)   SpO2 98%   BMI 39.29 kg/m  Wt Readings from Last 3 Encounters:  07/31/17 297 lb 12.8 oz (135.1 kg)  01/02/17 287 lb (130.2 kg)  12/31/16 288 lb 6.4 oz (130.8 kg)     Lab Results  Component Value Date   WBC 9.8 01/21/2017   HGB 16.2 01/29/2017   HCT 52.5 (H) 01/21/2017   PLT 203.0 01/21/2017   GLUCOSE 101 (H) 01/21/2017   CHOL 114 01/21/2017   TRIG 144.0 01/21/2017   HDL 34.80 (L) 01/21/2017   LDLCALC 50 01/21/2017   ALT 31 01/29/2017   AST 26 01/29/2017   NA 142 01/21/2017   K 4.3 01/21/2017   CL 105 01/21/2017   CREATININE 0.88 01/21/2017   BUN 12 01/21/2017   CO2 32 01/21/2017   TSH 2.41 01/21/2017   PSA 3.49 11/08/2015   INR 2.6 07/21/2017    Ct Abdomen Pelvis W Wo Contrast  Result Date: 12/08/2015 CLINICAL DATA:  Patient had two episodes of painless gross hematuria 18 months apart. Patient doesn't have a prior history of hematuria. Hx Kidney stones and BPH. EXAM: CT ABDOMEN AND PELVIS WITHOUT AND WITH CONTRAST TECHNIQUE: Multidetector CT imaging of the abdomen and pelvis was performed following the standard protocol before and following the bolus administration of intravenous contrast. CONTRAST:  139mL ISOVUE-300 IOPAMIDOL (ISOVUE-300) INJECTION 61% COMPARISON:  CT 10/07/2011 FINDINGS: Lower chest: Lung bases are clear. Hepatobiliary: Normal liver. Small gallstone present. No biliary duct dilatation.  Common bile duct normal. Pancreas: Pancreas is normal. No ductal dilatation. No pancreatic inflammation. Spleen: Normal spleen Adrenals/urinary tract: Adrenal glands are normal. No nephrolithiasis for ureterolithiasis. No enhancing renal cortical lesion. No filling defect within the collecting systems or ureters. No bladder calculi or enhancing bladder lesion. Enlarged prostate gland elevates the base the bladder. One nodular filling defect base the bladder measures 6 mm (image 89, series 12). This is adjacent to and likely an extension of the prostate nodular hypertrophy. Stomach/Bowel: Stomach, small bowel, appendix, and cecum are normal. The colon and rectosigmoid colon are normal. Vascular/Lymphatic: Abdominal aorta is normal caliber. There is no retroperitoneal or periportal  lymphadenopathy. No pelvic lymphadenopathy. Reproductive: Prostate gland is enlarged and elevates base the bladder as above. Prostate gland measures 71 mm in diameter Other: No free fluid. Musculoskeletal: No aggressive osseous lesion. IMPRESSION: 1. No explanation for hematuria. No nephrolithiasis, ureterolithiasis, enhancing renal cortical lesion, or filling defects within the collecting systems. 2. Enlarged prostate gland elevates base of bladder. One filling defect at the base the bladder is favored an extension of the prostate hypertrophy but not cannot completely exclude a papillary bladder lesion. Electronically Signed   By: Suzy Bouchard M.D.   On: 12/08/2015 09:20       Assessment & Plan:   Problem List Items Addressed This Visit    Atrial fibrillation ([place])    On coumadin.  Followed by cardiology.  Stable.       Dizziness    Persistent intermittent dizziness.  Appears to be worse with certain positions.  Check carotid ultrasound and refer to ENT for further evaluation.        Relevant Orders   Ambulatory referral to ENT   Ambulatory referral to Vascular Surgery   Essential hypertension    Blood pressure on  recheck improved.  Continue same medication regimen.  Follow pressures.  Follow metabolic panel.        Relevant Orders   Basic metabolic panel   Hypercholesterolemia    On lovastatin.  Low cholesterol diet and exercise.  Follow lipid panel and liver function tests.        Relevant Orders   Hepatic function panel   Lipid panel   Obstructive sleep apnea syndrome    CPAP.        Other Visit Diagnoses    Hyperglycemia    -  Primary   Relevant Orders   Hemoglobin A1c   Elevated hemoglobin (HCC)       Relevant Orders   CBC with Differential/Platelet       Einar Pheasant, MD

## 2017-08-03 ENCOUNTER — Encounter: Payer: Self-pay | Admitting: Internal Medicine

## 2017-08-03 DIAGNOSIS — R42 Dizziness and giddiness: Secondary | ICD-10-CM | POA: Insufficient documentation

## 2017-08-03 NOTE — Assessment & Plan Note (Signed)
On lovastatin.  Low cholesterol diet and exercise.  Follow lipid panel and liver function tests.   

## 2017-08-03 NOTE — Assessment & Plan Note (Signed)
CPAP.  

## 2017-08-03 NOTE — Assessment & Plan Note (Signed)
Blood pressure on recheck improved.  Continue same medication regimen.  Follow pressures.  Follow metabolic panel.   

## 2017-08-03 NOTE — Assessment & Plan Note (Signed)
On coumadin.  Followed by cardiology.  Stable.  

## 2017-08-03 NOTE — Assessment & Plan Note (Signed)
Persistent intermittent dizziness.  Appears to be worse with certain positions.  Check carotid ultrasound and refer to ENT for further evaluation.

## 2017-08-05 ENCOUNTER — Encounter: Payer: Self-pay | Admitting: Internal Medicine

## 2017-08-05 ENCOUNTER — Other Ambulatory Visit (INDEPENDENT_AMBULATORY_CARE_PROVIDER_SITE_OTHER): Payer: Medicare Other

## 2017-08-05 DIAGNOSIS — E7800 Pure hypercholesterolemia, unspecified: Secondary | ICD-10-CM

## 2017-08-05 DIAGNOSIS — R739 Hyperglycemia, unspecified: Secondary | ICD-10-CM | POA: Diagnosis not present

## 2017-08-05 DIAGNOSIS — D582 Other hemoglobinopathies: Secondary | ICD-10-CM

## 2017-08-05 DIAGNOSIS — I10 Essential (primary) hypertension: Secondary | ICD-10-CM

## 2017-08-05 LAB — BASIC METABOLIC PANEL
BUN: 20 mg/dL (ref 6–23)
CALCIUM: 9.4 mg/dL (ref 8.4–10.5)
CO2: 31 meq/L (ref 19–32)
CREATININE: 0.98 mg/dL (ref 0.40–1.50)
Chloride: 104 mEq/L (ref 96–112)
GFR: 81.02 mL/min (ref >60.00)
GLUCOSE: 99 mg/dL (ref 70–99)
Potassium: 4.1 mEq/L (ref 3.5–5.1)
Sodium: 140 mEq/L (ref 135–145)

## 2017-08-05 LAB — CBC WITH DIFFERENTIAL/PLATELET
BASOS PCT: 0.9 % (ref 0.0–3.0)
Basophils Absolute: 0.1 10*3/uL (ref 0.0–0.1)
EOS PCT: 3.7 % (ref 0.0–5.0)
Eosinophils Absolute: 0.3 10*3/uL (ref 0.0–0.7)
HEMATOCRIT: 48.7 % (ref 39.0–52.0)
HEMOGLOBIN: 16.8 g/dL (ref 13.0–17.0)
Lymphocytes Relative: 23.5 % (ref 12.0–46.0)
Lymphs Abs: 2.2 10*3/uL (ref 0.7–4.0)
MCHC: 34.5 g/dL (ref 30.0–36.0)
MCV: 87.8 fl (ref 78.0–100.0)
MONOS PCT: 9.3 % (ref 3.0–12.0)
Monocytes Absolute: 0.9 10*3/uL (ref 0.1–1.0)
Neutro Abs: 5.7 10*3/uL (ref 1.4–7.7)
Neutrophils Relative %: 62.6 % (ref 43.0–77.0)
Platelets: 184 10*3/uL (ref 150.0–400.0)
RBC: 5.54 Mil/uL (ref 4.22–5.81)
RDW: 13.2 % (ref 11.5–15.5)
WBC: 9.2 10*3/uL (ref 4.0–10.5)

## 2017-08-05 LAB — LIPID PANEL
Cholesterol: 132 mg/dL (ref 0–200)
HDL: 33.2 mg/dL — AB (ref >39.00)
LDL Cholesterol: 70 mg/dL (ref 0–99)
NONHDL: 98.46
Total CHOL/HDL Ratio: 4
Triglycerides: 143 mg/dL (ref 0.0–149.0)
VLDL: 28.6 mg/dL (ref 0.0–40.0)

## 2017-08-05 LAB — HEPATIC FUNCTION PANEL
ALBUMIN: 3.8 g/dL (ref 3.5–5.2)
ALT: 33 U/L (ref 0–53)
AST: 29 U/L (ref 0–37)
Alkaline Phosphatase: 62 U/L (ref 39–117)
Bilirubin, Direct: 0.1 mg/dL (ref 0.0–0.3)
TOTAL PROTEIN: 7 g/dL (ref 6.0–8.3)
Total Bilirubin: 0.9 mg/dL (ref 0.2–1.2)

## 2017-08-05 LAB — HEMOGLOBIN A1C: HEMOGLOBIN A1C: 5.5 % (ref 4.6–6.5)

## 2017-08-14 DIAGNOSIS — R42 Dizziness and giddiness: Secondary | ICD-10-CM | POA: Diagnosis not present

## 2017-08-23 ENCOUNTER — Other Ambulatory Visit: Payer: Self-pay | Admitting: Cardiovascular Disease

## 2017-08-25 ENCOUNTER — Ambulatory Visit (INDEPENDENT_AMBULATORY_CARE_PROVIDER_SITE_OTHER): Payer: Medicare Other

## 2017-08-25 DIAGNOSIS — I482 Chronic atrial fibrillation, unspecified: Secondary | ICD-10-CM

## 2017-08-25 DIAGNOSIS — Z5181 Encounter for therapeutic drug level monitoring: Secondary | ICD-10-CM | POA: Diagnosis not present

## 2017-08-25 LAB — POCT INR: INR: 2.1

## 2017-08-25 NOTE — Patient Instructions (Signed)
Please continue on same dosage 5mg  daily except 7mg  on Fridays.   Please be consistent with your greens. Recheck in 4 weeks.

## 2017-09-22 ENCOUNTER — Other Ambulatory Visit: Payer: Self-pay | Admitting: Cardiovascular Disease

## 2017-09-22 ENCOUNTER — Ambulatory Visit (INDEPENDENT_AMBULATORY_CARE_PROVIDER_SITE_OTHER): Payer: Medicare Other

## 2017-09-22 DIAGNOSIS — Z5181 Encounter for therapeutic drug level monitoring: Secondary | ICD-10-CM | POA: Diagnosis not present

## 2017-09-22 DIAGNOSIS — I482 Chronic atrial fibrillation, unspecified: Secondary | ICD-10-CM

## 2017-09-22 LAB — POCT INR: INR: 2.4

## 2017-09-22 NOTE — Patient Instructions (Signed)
Please continue on same dosage 5mg  daily except 7mg  on Fridays.   Please be consistent with your greens. Recheck in 5 weeks.

## 2017-10-09 ENCOUNTER — Other Ambulatory Visit: Payer: Self-pay | Admitting: Cardiovascular Disease

## 2017-10-10 ENCOUNTER — Other Ambulatory Visit: Payer: Self-pay | Admitting: Cardiovascular Disease

## 2017-10-12 ENCOUNTER — Encounter: Payer: Self-pay | Admitting: Internal Medicine

## 2017-10-12 ENCOUNTER — Inpatient Hospital Stay: Payer: Medicare Other

## 2017-10-12 ENCOUNTER — Other Ambulatory Visit: Payer: Self-pay

## 2017-10-12 ENCOUNTER — Inpatient Hospital Stay
Admission: EM | Admit: 2017-10-12 | Discharge: 2017-10-14 | DRG: 872 | Disposition: A | Discharge status: Home / Self Care | Payer: Medicare Other | Attending: Internal Medicine | Admitting: Internal Medicine

## 2017-10-12 DIAGNOSIS — Z87891 Personal history of nicotine dependence: Secondary | ICD-10-CM | POA: Diagnosis not present

## 2017-10-12 DIAGNOSIS — I4891 Unspecified atrial fibrillation: Secondary | ICD-10-CM | POA: Diagnosis not present

## 2017-10-12 DIAGNOSIS — E872 Acidosis, unspecified: Secondary | ICD-10-CM

## 2017-10-12 DIAGNOSIS — M109 Gout, unspecified: Secondary | ICD-10-CM | POA: Diagnosis present

## 2017-10-12 DIAGNOSIS — Q211 Atrial septal defect: Secondary | ICD-10-CM | POA: Diagnosis not present

## 2017-10-12 DIAGNOSIS — L03115 Cellulitis of right lower limb: Secondary | ICD-10-CM | POA: Diagnosis present

## 2017-10-12 DIAGNOSIS — M1712 Unilateral primary osteoarthritis, left knee: Secondary | ICD-10-CM | POA: Diagnosis present

## 2017-10-12 DIAGNOSIS — E785 Hyperlipidemia, unspecified: Secondary | ICD-10-CM | POA: Diagnosis present

## 2017-10-12 DIAGNOSIS — Z7901 Long term (current) use of anticoagulants: Secondary | ICD-10-CM | POA: Diagnosis not present

## 2017-10-12 DIAGNOSIS — I10 Essential (primary) hypertension: Secondary | ICD-10-CM | POA: Diagnosis present

## 2017-10-12 DIAGNOSIS — Z87442 Personal history of urinary calculi: Secondary | ICD-10-CM

## 2017-10-12 DIAGNOSIS — R600 Localized edema: Secondary | ICD-10-CM | POA: Diagnosis not present

## 2017-10-12 DIAGNOSIS — Z833 Family history of diabetes mellitus: Secondary | ICD-10-CM

## 2017-10-12 DIAGNOSIS — Z8249 Family history of ischemic heart disease and other diseases of the circulatory system: Secondary | ICD-10-CM | POA: Diagnosis not present

## 2017-10-12 DIAGNOSIS — A419 Sepsis, unspecified organism: Secondary | ICD-10-CM | POA: Diagnosis not present

## 2017-10-12 DIAGNOSIS — G4733 Obstructive sleep apnea (adult) (pediatric): Secondary | ICD-10-CM | POA: Diagnosis present

## 2017-10-12 DIAGNOSIS — L03116 Cellulitis of left lower limb: Secondary | ICD-10-CM | POA: Diagnosis not present

## 2017-10-12 DIAGNOSIS — E7800 Pure hypercholesterolemia, unspecified: Secondary | ICD-10-CM | POA: Diagnosis present

## 2017-10-12 DIAGNOSIS — E860 Dehydration: Secondary | ICD-10-CM | POA: Diagnosis present

## 2017-10-12 DIAGNOSIS — I82409 Acute embolism and thrombosis of unspecified deep veins of unspecified lower extremity: Secondary | ICD-10-CM

## 2017-10-12 DIAGNOSIS — L0390 Cellulitis, unspecified: Secondary | ICD-10-CM | POA: Diagnosis present

## 2017-10-12 LAB — CBC WITH DIFFERENTIAL/PLATELET
BASOS ABS: 0.1 10*3/uL (ref 0–0.1)
BASOS PCT: 1 %
EOS PCT: 1 %
Eosinophils Absolute: 0.2 10*3/uL (ref 0–0.7)
HEMATOCRIT: 51 % (ref 40.0–52.0)
Hemoglobin: 17.3 g/dL (ref 13.0–18.0)
Lymphocytes Relative: 13 %
Lymphs Abs: 2.1 10*3/uL (ref 1.0–3.6)
MCH: 30.1 pg (ref 26.0–34.0)
MCHC: 34 g/dL (ref 32.0–36.0)
MCV: 88.6 fL (ref 80.0–100.0)
MONO ABS: 0.8 10*3/uL (ref 0.2–1.0)
Monocytes Relative: 4 %
NEUTROS ABS: 13.8 10*3/uL — AB (ref 1.4–6.5)
Neutrophils Relative %: 81 %
PLATELETS: 187 10*3/uL (ref 150–440)
RBC: 5.76 MIL/uL (ref 4.40–5.90)
RDW: 14 % (ref 11.5–14.5)
WBC: 17 10*3/uL — ABNORMAL HIGH (ref 3.8–10.6)

## 2017-10-12 LAB — COMPREHENSIVE METABOLIC PANEL
ALBUMIN: 4.2 g/dL (ref 3.5–5.0)
ALK PHOS: 63 U/L (ref 38–126)
ALT: 33 U/L (ref 17–63)
AST: 39 U/L (ref 15–41)
Anion gap: 11 (ref 5–15)
BILIRUBIN TOTAL: 1.4 mg/dL — AB (ref 0.3–1.2)
BUN: 14 mg/dL (ref 6–20)
CALCIUM: 8.9 mg/dL (ref 8.9–10.3)
CO2: 26 mmol/L (ref 22–32)
CREATININE: 0.9 mg/dL (ref 0.61–1.24)
Chloride: 103 mmol/L (ref 101–111)
GFR calc Af Amer: >60 mL/min (ref >60)
GFR calc non Af Amer: >60 mL/min (ref >60)
GLUCOSE: 119 mg/dL — AB (ref 65–99)
Potassium: 3.5 mmol/L (ref 3.5–5.1)
Sodium: 140 mmol/L (ref 135–145)
TOTAL PROTEIN: 7.4 g/dL (ref 6.5–8.1)

## 2017-10-12 LAB — LACTIC ACID, PLASMA
LACTIC ACID, VENOUS: 3.1 mmol/L — AB (ref 0.5–1.9)
LACTIC ACID, VENOUS: 1.6 mmol/L (ref 0.5–1.9)
Lactic Acid, Venous: 2.3 mmol/L (ref 0.5–1.9)

## 2017-10-12 LAB — PROTIME-INR
INR: 2.11
PROTHROMBIN TIME: 23.5 s — AB (ref 11.4–15.2)

## 2017-10-12 MED ORDER — WARFARIN SODIUM 1 MG PO TABS
7.0000 mg | ORAL_TABLET | ORAL | Status: DC
Start: 2017-10-17 — End: 2017-10-14

## 2017-10-12 MED ORDER — ACETAMINOPHEN 325 MG PO TABS
650.0000 mg | ORAL_TABLET | Freq: Four times a day (QID) | ORAL | Status: DC | PRN
  Administered 2017-10-12: 650 mg via ORAL
  Filled 2017-10-12: qty 2

## 2017-10-12 MED ORDER — POTASSIUM CHLORIDE CRYS ER 20 MEQ PO TBCR
20.0000 meq | EXTENDED_RELEASE_TABLET | Freq: Once | ORAL | Status: AC
  Administered 2017-10-12: 20 meq via ORAL
  Filled 2017-10-12: qty 1

## 2017-10-12 MED ORDER — WARFARIN SODIUM 5 MG PO TABS
5.0000 mg | ORAL_TABLET | ORAL | Status: DC
  Administered 2017-10-12 – 2017-10-13 (×2): 5 mg via ORAL
  Filled 2017-10-12 (×2): qty 1

## 2017-10-12 MED ORDER — ONDANSETRON HCL 4 MG PO TABS: 4.0000 mg | ORAL_TABLET | Freq: Four times a day (QID) | ORAL | Status: DC | PRN

## 2017-10-12 MED ORDER — ACETAMINOPHEN 650 MG RE SUPP: 650.0000 mg | Freq: Four times a day (QID) | RECTAL | Status: DC | PRN

## 2017-10-12 MED ORDER — PIPERACILLIN-TAZOBACTAM 3.375 G IVPB 30 MIN
3.3750 g | Freq: Once | INTRAVENOUS | Status: AC
Start: 2017-10-12 — End: 2017-10-12
  Administered 2017-10-12: 3.375 g via INTRAVENOUS
  Filled 2017-10-12: qty 50

## 2017-10-12 MED ORDER — SODIUM CHLORIDE 0.9 % IV BOLUS
1000.0000 mL | Freq: Once | INTRAVENOUS | Status: AC
  Administered 2017-10-12: 1000 mL via INTRAVENOUS

## 2017-10-12 MED ORDER — SODIUM CHLORIDE 0.9 % IV SOLN
INTRAVENOUS | Status: DC
  Administered 2017-10-12 – 2017-10-13 (×2): via INTRAVENOUS

## 2017-10-12 MED ORDER — VANCOMYCIN HCL IN DEXTROSE 1-5 GM/200ML-% IV SOLN
1000.0000 mg | Freq: Once | INTRAVENOUS | Status: AC
  Administered 2017-10-12: 1000 mg via INTRAVENOUS
  Filled 2017-10-12: qty 200

## 2017-10-12 MED ORDER — SENNOSIDES-DOCUSATE SODIUM 8.6-50 MG PO TABS: 1.0000 | ORAL_TABLET | Freq: Every evening | ORAL | Status: DC | PRN

## 2017-10-12 MED ORDER — AMLODIPINE BESYLATE 10 MG PO TABS
10.0000 mg | ORAL_TABLET | Freq: Every day | ORAL | Status: DC
  Administered 2017-10-12 – 2017-10-13 (×2): 10 mg via ORAL
  Filled 2017-10-12 (×2): qty 1

## 2017-10-12 MED ORDER — HYDROCODONE-ACETAMINOPHEN 5-325 MG PO TABS: 1.0000 | ORAL_TABLET | ORAL | Status: DC | PRN

## 2017-10-12 MED ORDER — LISINOPRIL 20 MG PO TABS
20.0000 mg | ORAL_TABLET | Freq: Every day | ORAL | Status: DC
  Administered 2017-10-12 – 2017-10-13 (×2): 20 mg via ORAL
  Filled 2017-10-12 (×2): qty 1

## 2017-10-12 MED ORDER — VANCOMYCIN HCL 10 G IV SOLR
1250.0000 mg | Freq: Three times a day (TID) | INTRAVENOUS | Status: DC
  Administered 2017-10-12 – 2017-10-13 (×4): 1250 mg via INTRAVENOUS
  Filled 2017-10-12 (×6): qty 1250

## 2017-10-12 MED ORDER — FINASTERIDE 5 MG PO TABS
5.0000 mg | ORAL_TABLET | Freq: Every day | ORAL | Status: DC
  Administered 2017-10-12 – 2017-10-13 (×2): 5 mg via ORAL
  Filled 2017-10-12 (×2): qty 1

## 2017-10-12 MED ORDER — PRAVASTATIN SODIUM 20 MG PO TABS
20.0000 mg | ORAL_TABLET | Freq: Every day | ORAL | Status: DC
  Administered 2017-10-12 – 2017-10-13 (×2): 20 mg via ORAL
  Filled 2017-10-12 (×2): qty 1

## 2017-10-12 MED ORDER — ONDANSETRON HCL 4 MG/2ML IJ SOLN: 4.0000 mg | Freq: Four times a day (QID) | INTRAMUSCULAR | Status: DC | PRN

## 2017-10-12 MED ORDER — WARFARIN - PHARMACIST DOSING INPATIENT: Freq: Every day | Status: DC

## 2017-10-12 MED ORDER — METOPROLOL TARTRATE 50 MG PO TABS
50.0000 mg | ORAL_TABLET | Freq: Two times a day (BID) | ORAL | Status: DC
  Administered 2017-10-12 – 2017-10-13 (×4): 50 mg via ORAL
  Filled 2017-10-12 (×4): qty 1

## 2017-10-12 MED ORDER — PIPERACILLIN-TAZOBACTAM 3.375 G IVPB
3.3750 g | Freq: Three times a day (TID) | INTRAVENOUS | Status: DC
Start: 2017-10-12 — End: 2017-10-13
  Administered 2017-10-12 – 2017-10-13 (×3): 3.375 g via INTRAVENOUS
  Filled 2017-10-12 (×3): qty 50

## 2017-10-12 MED ORDER — DIGOXIN 125 MCG PO TABS
0.1250 mg | ORAL_TABLET | Freq: Every day | ORAL | Status: DC
  Administered 2017-10-12 – 2017-10-13 (×2): 0.125 mg via ORAL
  Filled 2017-10-12 (×4): qty 1

## 2017-10-12 NOTE — ED Notes (Signed)
Verbal report given to Shanon Brow, RN and Dr Dahlia Client including notification of Lactic Acid 3.1 and WBC 17.0

## 2017-10-12 NOTE — ED Triage Notes (Signed)
Patient with fever and redness to right lower leg.  Reports history of cellulitis with hospitalization to same leg.

## 2017-10-12 NOTE — Consult Note (Signed)
Pharmacy Antibiotic Note  Michael Blevins is a 67 y.o. male admitted on 10/12/2017 with sepsis secondary to right leg cellulitis.  Pharmacy has been consulted for vancomycin and zosyn dosing. Patient received vancomycin 1g IV x 1 dose and Zosyn 3.375 IV x 1 dose in ED  Plan: DW: 101kg  Ke: 0.096 t1/2: 7.22  Vd: 70  Start Vancomycin 1250 IV every 8 hours.  Goal trough 10-15 mcg/mL. Calculated trough at Css is 14.2. Trough level ordered prior to 4th dose.   Start Zosyn 3.375 IV EI every 8 hours.   Height: 6\' 1"  (185.4 cm) Weight: 285 lb (129.3 kg) IBW/kg (Calculated) : 79.9  Temp (24hrs), Avg:99 F (37.2 C), Min:98.6 F (37 C), Max:99.7 F (37.6 C)  Recent Labs  Lab 10/12/17 0559 10/12/17 0601 10/12/17 1014 10/12/17 1211  WBC 17.0*  --   --   --   CREATININE 0.90  --   --   --   LATICACIDVEN  --  3.1* 1.6 2.3*    Estimated Creatinine Clearance: 112.3 mL/min (by C-G formula based on SCr of 0.9 mg/dL).    No Known Allergies  Antimicrobials this admission: 5/5 Zosyn >  5/5 vancomycin >>   Microbiology results: 5/5 BCx: pending  Thank you for allowing pharmacy to be a part of this patient's care.  Pernell Dupre, PharmD, BCPS Clinical Pharmacist 10/12/2017 2:52 PM

## 2017-10-12 NOTE — Progress Notes (Signed)
Advanced care plan.  Purpose of the Encounter: CODE STATUS  Parties in Attendance:Patient  Patient's Decision Capacity: Good Subjective/Patient's story: Presented to emergency room with swelling in the right leg and redness Objective/Medical story Has right lower extremity cellulitis Goals of care determination:  Advanced directives were discussed with the patient Patient wants everything done for now which includes cardiac resuscitation, intubation and ventilator if the need arises CODE STATUS: Full code Time spent discussing advanced care planning: 16 minutes

## 2017-10-12 NOTE — ED Notes (Signed)
Pt states that early this AM felt chills. Wife states that when he gets a fever 3/4 times in past has had to be hospitalized. Wife states he is not immunocompromised but that when he gets a fever he immediately has to get to hospital.  Denies cough, UTI symptoms, abd pain. Denies pain in general. Alert, oriented, feels hot to touch, states was given 2 warm blankets when roomed. No distress noted.

## 2017-10-12 NOTE — ED Notes (Signed)
Pt taken to US

## 2017-10-12 NOTE — ED Provider Notes (Addendum)
[facility] Emergency Department Provider Note   ____________________________________________   First MD Initiated Contact with Patient 10/12/17 [phone]     (approximate)  I have reviewed the triage vital signs and the nursing notes.   HISTORY  Chief Complaint Fever    HPI Michael Blevins is a 67 y.o. male who comes into the hospital today with cellulitis.  The patient states that he had it multiple times and is been hospitalized in the past for cellulitis.  He has not had this in 10 years.  The patient's wife states that when he gets sick it happens quickly.  He took a dose of Avelox today as that has worked for his cellulitis in the past.  He has redness to his right lower extremity and he had some shakes and chills.  The patient denies any measured fever at home.  He states that his temperature gets high if he does not get treated right away.  He has had some nausea with no vomiting.  His wife states that the Avelox is the only thing that helps.   Past Medical History:  Diagnosis Date  . Allergy    cats and pollen  . Asthma   . Atrial fibrillation ([place])   . Back pain   . Cellulitis   . Gout   . History of chicken pox   . Hyperlipidemia   . Hypertension   . Kidney stone   . Sleep apnea   . Venous insufficiency     Patient Active Problem List   Diagnosis Date Noted  . Dizziness 08/03/2017  . Impaired fasting glucose 03/17/2017  . Arthritis of left knee 12/03/2016  . Primary osteoarthritis of left knee 11/26/2016  . Encounter for therapeutic drug monitoring 07/17/2016  . Atrial septal defect 07/05/2016  . SOB (shortness of breath) on exertion 05/20/2016  . Atrial septal defect, secundum 05/20/2016  . HLD (hyperlipidemia) 07/11/2015  . BP (high blood pressure) 07/11/2015  . Elevated fasting blood sugar 07/11/2015  . Calculus of kidney 07/11/2015  . Health care maintenance 11/06/2014  . Atrial fibrillation ([place]) 07/25/2014  . Essential  hypertension 07/25/2014  . Hypercholesterolemia 07/25/2014  . Nephrolithiasis 07/25/2014  . Obstructive sleep apnea syndrome 07/25/2014  . Venous insufficiency 07/25/2014  . Apnea, sleep 02/11/2014  . H/O cardiac catheterization 02/11/2014  . Blood in the urine 10/15/2011  . Skin lesion 10/15/2011  . Back ache 09/26/2011  . Adiposity 09/26/2011    Past Surgical History:  Procedure Laterality Date  . APPENDECTOMY    . CARDIAC CATHETERIZATION      Prior to Admission medications   Medication Sig Start Date End Date Taking? Authorizing Provider  amLODipine (NORVASC) 10 MG tablet TAKE 1 TABLET(10 MG) BY MOUTH DAILY 07/31/17   Minna Merritts, MD  DIGOX 125 MCG tablet TAKE 1 TABLET(0.125 MG) BY MOUTH DAILY 07/31/17   Minna Merritts, MD  finasteride (PROSCAR) 5 MG tablet TAKE 1 TABLET(5 MG) BY MOUTH DAILY 07/29/17   McGowan, Larene Beach A, PA-C  furosemide (LASIX) 20 MG tablet Take 1 tablet (20 mg total) by mouth 2 (two) times daily as needed. 08/07/16   Minna Merritts, MD  furosemide (LASIX) 20 MG tablet TAKE 1 TABLET(20 MG) BY MOUTH TWICE DAILY AS NEEDED 09/22/17   Minna Merritts, MD  lisinopril (PRINIVIL,ZESTRIL) 20 MG tablet Take 1 tablet (20 mg total) by mouth daily. 08/07/16   Minna Merritts, MD  lovastatin (MEVACOR) 20 MG tablet TAKE 1 TABLET(20 MG)  BY MOUTH AT BEDTIME 10/09/17   Minna Merritts, MD  metoprolol tartrate (LOPRESSOR) 50 MG tablet TAKE 1 TABLET(50 MG) BY MOUTH TWICE DAILY 08/25/17   Minna Merritts, MD  warfarin (COUMADIN) 2 MG tablet Take 2 mg by mouth daily.    [provider]  warfarin (COUMADIN) 5 MG tablet TAKE 1 TABLET BY MOUTH AS DIRECTED BY THE COUMADIN CLINIC 08/01/17   Minna Merritts, MD    Allergies Patient has no known allergies.  Family History  Problem Relation Age of Onset  . Hypertension Mother   . Diabetes Father   . Kidney disease Neg Hx   . Prostate cancer Neg Hx     Social History Social History   Tobacco Use  . Smoking  status: Former Research scientist (life sciences)  . Smokeless tobacco: Never Used  . Tobacco comment: quit 33 years  Substance Use Topics  . Alcohol use: No    Alcohol/week: 0.0 oz  . Drug use: No    Review of Systems  Constitutional:  fever/chills Eyes: No visual changes. ENT: No sore throat. Cardiovascular: Denies chest pain. Respiratory: Denies shortness of breath. Gastrointestinal: nausea with no abdominal pain.  no vomiting.  No diarrhea.  No constipation. Genitourinary: Negative for dysuria. Musculoskeletal: Negative for back pain. Skin: redness to RLE. Neurological: Negative for headaches, focal weakness or numbness.   ____________________________________________   PHYSICAL EXAM:  VITAL SIGNS: ED Triage Vitals  Enc Vitals Group     BP 10/12/17 0555 126/83     Pulse Rate 10/12/17 0555 86     Resp 10/12/17 0555 20     Temp 10/12/17 0555 98.6 F (37 C)     Temp Source 10/12/17 0555 Oral     SpO2 10/12/17 0555 96 %     Weight 10/12/17 0557 285 lb (129.3 kg)     Height 10/12/17 0557 6\' 1"  (1.854 m)     Head Circumference --      Peak Flow --      Pain Score 10/12/17 0557 5     Pain Loc --      Pain Edu? --      Excl. in [place]? --     Constitutional: Alert and oriented. Well appearing and in moderate distress. Eyes: Conjunctivae are normal. PERRL. EOMI. Head: Atraumatic. Nose: No congestion/rhinnorhea. Mouth/Throat: Mucous membranes are moist.  Oropharynx non-erythematous. Cardiovascular: Normal rate, regular rhythm. Grossly normal heart sounds.  Good peripheral circulation. Respiratory: Normal respiratory effort.  No retractions. Lungs CTAB. Gastrointestinal: Soft and nontender. No distention. Positive bowel sounds Musculoskeletal: No lower extremity tenderness nor edema.   Neurologic:  Normal speech and language.  Skin:  Skin is warm, dry and intact. Warmth and erythema to RLE, no blisters, no crepitus Psychiatric: Mood and affect are normal. Speech and behavior are  normal.  ____________________________________________   LABS (all labs ordered are listed, but only abnormal results are displayed)  Labs Reviewed  LACTIC ACID, PLASMA - Abnormal; Notable for the following components:      Result Value   Lactic Acid, Venous 3.1 (*)    All other components within normal limits  COMPREHENSIVE METABOLIC PANEL - Abnormal; Notable for the following components:   Glucose, Bld 119 (*)    Total Bilirubin 1.4 (*)    All other components within normal limits  CBC WITH DIFFERENTIAL/PLATELET - Abnormal; Notable for the following components:   WBC 17.0 (*)    Neutro Abs 13.8 (*)    All other components within normal limits  CULTURE, BLOOD (ROUTINE X 2)  CULTURE, BLOOD (ROUTINE X 2)  LACTIC ACID, PLASMA  URINALYSIS, COMPLETE (UACMP) WITH MICROSCOPIC   ____________________________________________  EKG  none ____________________________________________  RADIOLOGY  ED MD interpretation:  none  Official radiology report(s): No results found.  ____________________________________________   PROCEDURES  Procedure(s) performed: None  .Critical Care Performed by: Loney Hering, MD Authorized by: Loney Hering, MD   Critical care provider statement:    Critical care time (minutes):  30   Critical care start time:  10/12/2017 6:39 AM   Critical care end time:  10/12/2017 7:10 AM   Critical care time was exclusive of:  Separately billable procedures and treating other patients   Critical care was necessary to treat or prevent imminent or life-threatening deterioration of the following conditions:  Sepsis   Critical care was time spent personally by me on the following activities:  Development of treatment plan with patient or surrogate, evaluation of patient's response to treatment, examination of patient, obtaining history from patient or surrogate, ordering and performing treatments and interventions, ordering and review of laboratory studies,  ordering and review of radiographic studies, pulse oximetry, re-evaluation of patient's condition and review of old charts   I assumed direction of critical care for this patient from another provider in my specialty: no      Critical Care performed: No  ____________________________________________   INITIAL IMPRESSION / ASSESSMENT AND PLAN / ED COURSE  As part of my medical decision making, I reviewed the following data within the electronic MEDICAL RECORD NUMBER Notes from prior ED visits and [place] Controlled Substance Database  This is a 67 year old male who comes into the hospital today with some redness to his right lower extremity.  My concern is that the patient has cellulitis.  We did check some blood work to include a CBC, CMP, lactic acid.  The patient's white blood cell count returned at 17,000 and the patient's lactic acid was 3.1.  The patient does not have any fever or tachycardia here but I am concerned about the development of sepsis.  I will give the patient a liter of normal saline as well as some Zosyn and vancomycin.  Given the patient's lactic acid that is elevated and elevated white blood cell count I will admit him to the hospitalist service for IV antibiotics to treat his cellulitis.  Although the patient states that he was previously treated with moxifloxacin I will give him the medications on our antibiogram.  The patient denies a history of MRSA.  He will be admitted to the hospitalist service.     ____________________________________________   FINAL CLINICAL IMPRESSION(S) / ED DIAGNOSES  Final diagnoses:  Cellulitis of right lower extremity  Lactic acidosis     ED Discharge Orders    None       Note:  This document was prepared using Dragon voice recognition software and may include unintentional dictation errors.    Loney Hering, MD 10/12/17 [id number]    Loney Hering, MD 10/21/17 [phone]

## 2017-10-12 NOTE — ED Notes (Signed)
Admitting at bedside 

## 2017-10-12 NOTE — Progress Notes (Signed)
Dr Estanislado Pandy notified of [place] 2.3.

## 2017-10-12 NOTE — Consult Note (Signed)
ANTICOAGULATION CONSULT NOTE - Initial Consult  Pharmacy Consult for Warfarin  Indication: atrial fibrillation  No Known Allergies  Patient Measurements: Height: 6\' 1"  (185.4 cm) Weight: 285 lb (129.3 kg) IBW/kg (Calculated) : 79.9 Vital Signs: Temp: 98.7 F (37.1 C) (05/05 0934) Temp Source: Oral (05/05 0934) BP: 140/74 (05/05 0934) Pulse Rate: 102 (05/05 0934)  Labs: Recent Labs    10/12/17 0559 10/12/17 1014  HGB 17.3  --   HCT 51.0  --   PLT 187  --   LABPROT  --  23.5*  INR  --  2.11  CREATININE 0.90  --     Estimated Creatinine Clearance: 112.3 mL/min (by C-G formula based on SCr of 0.9 mg/dL).   Medical History: Past Medical History:  Diagnosis Date  . Allergy    cats and pollen  . Asthma   . Atrial fibrillation ([place])   . Back pain   . Cellulitis   . Gout   . History of chicken pox   . Hyperlipidemia   . Hypertension   . Kidney stone   . Sleep apnea   . Venous insufficiency     Assessment: Pharmacy consulted for warfarin dosing and monitoring in 67 yo male admitted with PMH of A. Fib, admitted with cellulitis. INR 2.11 on admission.   Home Dose: Warfarin 5mg  Sun,Mon, Tues,Wed,Thus,Sat                        Warfarin 7mg - Friday   Goal of Therapy:  INR 2-3 Monitor platelets by anticoagulation protocol: Yes   Plan:  5/5 Will plan to continue patient's home does of warfarin 5mg . Patient is receiving Abx so will monitor INR daily per protocol.   INR ordered with AM labs.   Pernell Dupre, PharmD, BCPS Clinical Pharmacist 10/12/2017 2:29 PM

## 2017-10-12 NOTE — H&P (Signed)
[place] [facility] [place] NAME: Michael Blevins    MR#:  [id number]  DATE OF BIRTH:  1951-03-01  DATE OF ADMISSION:  10/12/2017  PRIMARY CARE PHYSICIAN: Einar Pheasant, MD   REQUESTING/REFERRING PHYSICIAN:   CHIEF COMPLAINT:   Chief Complaint  Patient presents with  . Fever    HISTORY OF PRESENT ILLNESS: Michael Blevins  is a 67 y.o. male with a known history of hypertension, atrial fibrillation, gout, sleep apnea, venous insufficiency on Coumadin for anticoagulation as outpatient presented to the emergency room with a swelling in the right lower extremity and pain.  Patient recently traveled from [place] driving an automobile.  He has swelling and pain in the right lower extremity with redness.  Has prior history of cellulitis in the right lower extremity.  Patient was evaluated in the emergency room he was started on IV vancomycin and Zosyn antibiotics.  His lactic acid level was elevated.  He appears dry and dehydrated.  The pain in the right leg is aching in nature 6 out of 10 on a scale of 1-10.  No complains of any chest pain, shortness of breath.*hospitalist service was consulted for further care.  PAST MEDICAL HISTORY:   Past Medical History:  Diagnosis Date  . Allergy    cats and pollen  . Asthma   . Atrial fibrillation ([place])   . Back pain   . Cellulitis   . Gout   . History of chicken pox   . Hyperlipidemia   . Hypertension   . Kidney stone   . Sleep apnea   . Venous insufficiency     PAST SURGICAL HISTORY:  Past Surgical History:  Procedure Laterality Date  . APPENDECTOMY    . CARDIAC CATHETERIZATION      SOCIAL HISTORY:  Social History   Tobacco Use  . Smoking status: Former Research scientist (life sciences)  . Smokeless tobacco: Never Used  . Tobacco comment: quit 33 years  Substance Use Topics  . Alcohol use: No    Alcohol/week: 0.0 oz    FAMILY HISTORY:  Family History  Problem Relation Age of Onset  . Hypertension  Mother   . Diabetes Father   . Kidney disease Neg Hx   . Prostate cancer Neg Hx     DRUG ALLERGIES: No Known Allergies  REVIEW OF SYSTEMS:   CONSTITUTIONAL: Had fever,no fatigue or weakness.  EYES: No blurred or double vision.  EARS, NOSE, AND THROAT: No tinnitus or ear pain.  RESPIRATORY: No cough, shortness of breath, wheezing or hemoptysis.  CARDIOVASCULAR: No chest pain, orthopnea, edema.  GASTROINTESTINAL: No nausea, vomiting, diarrhea or abdominal pain.  GENITOURINARY: No dysuria, hematuria.  ENDOCRINE: No polyuria, nocturia,  HEMATOLOGY: No anemia, easy bruising or bleeding SKIN: No rash or lesion. MUSCULOSKELETAL: No joint pain or arthritis.   Has right lower extremity swelling, redness and pain NEUROLOGIC: No tingling, numbness, weakness.  PSYCHIATRY: No anxiety or depression.   MEDICATIONS AT HOME:  Prior to Admission medications   Medication Sig Start Date End Date Taking? Authorizing Provider  amLODipine (NORVASC) 10 MG tablet TAKE 1 TABLET(10 MG) BY MOUTH DAILY 07/31/17  Yes Gollan, Kathlene November, MD  DIGOX 125 MCG tablet TAKE 1 TABLET(0.125 MG) BY MOUTH DAILY 07/31/17  Yes Gollan, Kathlene November, MD  finasteride (PROSCAR) 5 MG tablet TAKE 1 TABLET(5 MG) BY MOUTH DAILY 07/29/17  Yes McGowan, Larene Beach A, PA-C  furosemide (LASIX) 20 MG tablet Take 1 tablet (20 mg total) by mouth 2 (  two) times daily as needed. 08/07/16  Yes Minna Merritts, MD  lisinopril (PRINIVIL,ZESTRIL) 20 MG tablet Take 1 tablet (20 mg total) by mouth daily. 08/07/16  Yes Minna Merritts, MD  lovastatin (MEVACOR) 20 MG tablet TAKE 1 TABLET(20 MG) BY MOUTH AT BEDTIME 10/09/17  Yes Gollan, Kathlene November, MD  metoprolol tartrate (LOPRESSOR) 50 MG tablet TAKE 1 TABLET(50 MG) BY MOUTH TWICE DAILY 08/25/17  Yes Gollan, Kathlene November, MD  warfarin (COUMADIN) 5 MG tablet TAKE 1 TABLET BY MOUTH AS DIRECTED BY THE COUMADIN CLINIC 08/01/17  Yes Gollan, Kathlene November, MD      PHYSICAL EXAMINATION:   VITAL SIGNS: Blood pressure  140/74, pulse (!) 102, temperature 98.7 F (37.1 C), temperature source Oral, resp. rate 19, height 6\' 1"  (1.854 m), weight 129.3 kg (285 lb), SpO2 97 %.  GENERAL:  67 y.o.-year-old patient lying in the bed with no acute distress.  EYES: Pupils equal, round, reactive to light and accommodation. No scleral icterus. Extraocular muscles intact.  HEENT: Head atraumatic, normocephalic. Oropharynx and nasopharynx clear.  NECK:  Supple, no jugular venous distention. No thyroid enlargement, no tenderness.  LUNGS: Normal breath sounds bilaterally, no wheezing, rales,rhonchi or crepitation. No use of accessory muscles of respiration.  CARDIOVASCULAR: S1, S2 normal. No murmurs, rubs, or gallops.  ABDOMEN: Soft, nontender, nondistended. Bowel sounds present. No organomegaly or mass.  EXTREMITIES: No cyanosis, or clubbing.  Right lower extremity swelling with redness Right lower extremity tenderness Peripheral pulses present NEUROLOGIC: Cranial nerves II through XII are intact. Muscle strength 5/5 in all extremities. Sensation intact. Gait not checked.  PSYCHIATRIC: The patient is alert and oriented x 3.  SKIN: No obvious rash, lesion, or ulcer.   LABORATORY PANEL:   CBC Recent Labs  Lab 10/12/17 0559  WBC 17.0*  HGB 17.3  HCT 51.0  PLT 187  MCV 88.6  MCH 30.1  MCHC 34.0  RDW 14.0  LYMPHSABS 2.1  MONOABS 0.8  EOSABS 0.2  BASOSABS 0.1   ------------------------------------------------------------------------------------------------------------------  Chemistries  Recent Labs  Lab 10/12/17 0559  NA 140  K 3.5  CL 103  CO2 26  GLUCOSE 119*  BUN 14  CREATININE 0.90  CALCIUM 8.9  AST 39  ALT 33  ALKPHOS 63  BILITOT 1.4*   ------------------------------------------------------------------------------------------------------------------ estimated creatinine clearance is 112.3 mL/min (by C-G formula based on SCr of 0.9  mg/dL). ------------------------------------------------------------------------------------------------------------------ No results for input(s): TSH, T4TOTAL, T3FREE, THYROIDAB in the last 72 hours.  Invalid input(s): FREET3   Coagulation profile Recent Labs  Lab 10/12/17 1014  INR 2.11   ------------------------------------------------------------------------------------------------------------------- No results for input(s): DDIMER in the last 72 hours. -------------------------------------------------------------------------------------------------------------------  Cardiac Enzymes No results for input(s): CKMB, TROPONINI, MYOGLOBIN in the last 168 hours.  Invalid input(s): CK ------------------------------------------------------------------------------------------------------------------ Invalid input(s): POCBNP  ---------------------------------------------------------------------------------------------------------------  Urinalysis    Component Value Date/Time   COLORURINE YELLOW 10/25/2015 1523   APPEARANCEUR Clear 12/15/2015 1536   LABSPEC 1.015 10/25/2015 1523   PHURINE 5.5 10/25/2015 1523   GLUCOSEU Negative 12/15/2015 1536   GLUCOSEU NEGATIVE 10/25/2015 1523   HGBUR NEGATIVE 10/25/2015 1523   BILIRUBINUR Negative 12/15/2015 1536   KETONESUR TRACE (A) 10/25/2015 1523   PROTEINUR Negative 12/15/2015 1536   UROBILINOGEN 0.2 10/25/2015 1523   NITRITE Negative 12/15/2015 1536   NITRITE NEGATIVE 10/25/2015 1523   LEUKOCYTESUR Negative 12/15/2015 1536     RADIOLOGY: US Venous Img Lower Unilateral Right  Result Date: 10/12/2017 CLINICAL DATA:  Right lower extremity pain, edema and cellulitis. History of prior great  saphenous vein stripping. EXAM: RIGHT LOWER EXTREMITY VENOUS DOPPLER ULTRASOUND TECHNIQUE: Gray-scale sonography with graded compression, as well as color Doppler and duplex ultrasound were performed to evaluate the lower extremity deep venous  systems from the level of the common femoral vein and including the common femoral, femoral, profunda femoral, popliteal and calf veins including the posterior tibial, peroneal and gastrocnemius veins when visible. The superficial great saphenous vein was also interrogated. Spectral Doppler was utilized to evaluate flow at rest and with distal augmentation maneuvers in the common femoral, femoral and popliteal veins. COMPARISON:  None. FINDINGS: Contralateral Common Femoral Vein: Respiratory phasicity is normal and symmetric with the symptomatic side. No evidence of thrombus. Normal compressibility. Common Femoral Vein: No evidence of thrombus. Normal compressibility, respiratory phasicity and response to augmentation. Saphenofemoral Junction: No evidence of thrombus. Normal compressibility and flow on color Doppler imaging. Profunda Femoral Vein: No evidence of thrombus. Normal compressibility and flow on color Doppler imaging. Femoral Vein: No evidence of thrombus. Normal compressibility, respiratory phasicity and response to augmentation. Popliteal Vein: No evidence of thrombus. Normal compressibility, respiratory phasicity and response to augmentation. Calf Veins: No evidence of thrombus. Normal compressibility and flow on color Doppler imaging. Superficial Great Saphenous Vein: Not visualized. Venous Reflux:  None. Other Findings: No evidence of superficial thrombophlebitis or abnormal fluid collection. IMPRESSION: No evidence of right lower extremity deep venous thrombosis. Electronically Signed   By: Aletta Edouard M.D.   On: 10/12/2017 09:38    EKG: Orders placed or performed in visit on 01/02/17  . EKG 12-Lead    IMPRESSION AND PLAN:  67 year old male patient with history of venous insufficiency, atrial fibrillation, gout, sleep apnea, hypertension presented to the emergency room with swelling and pain in the right lower extremity.  -Right leg cellulitis Start patient on IV vancomycin and  Zosyn antibiotics  -Right lower extremity swelling Doppler ultrasound to rule out DVT  -Sepsis secondary to cellulitis of the leg IV fluids IV vancomycin and Zosyn antibiotics Follow-up lactic acid level  -DVT prophylaxis Continue Coumadin Pharmacy consult for INR monitoring and Coumadin dosing  All the records are reviewed and case discussed with ED provider. Management plans discussed with the patient, family and they are in agreement.  CODE STATUS: Full code    Code Status Orders  (From admission, onward)        Start     Ordered   10/12/17 0944  Full code  Continuous     10/12/17 0943    Code Status History    This patient has a current code status but no historical code status.       TOTAL TIME TAKING CARE OF THIS PATIENT: 51 minutes.    Saundra Shelling M.D on 10/12/2017 at 12:55 PM  Between 7am to 6pm - Pager - [phone]  After 6pm go to [email or web address] - password [email or web address] [facility]  Office  [phone]  CC: Primary care physician; Einar Pheasant, MD

## 2017-10-13 LAB — CREATININE, SERUM
Creatinine, Ser: 0.91 mg/dL (ref 0.61–1.24)
GFR calc Af Amer: >60 mL/min (ref >60)

## 2017-10-13 LAB — URINALYSIS, COMPLETE (UACMP) WITH MICROSCOPIC
BACTERIA UA: NONE SEEN
BILIRUBIN URINE: NEGATIVE
Glucose, UA: NEGATIVE mg/dL
KETONES UR: NEGATIVE mg/dL
LEUKOCYTES UA: NEGATIVE
NITRITE: NEGATIVE
PROTEIN: 30 mg/dL — AB
SQUAMOUS EPITHELIAL / LPF: NONE SEEN (ref 0–5)
Specific Gravity, Urine: 1.028 (ref 1.005–1.030)
pH: 5 (ref 5.0–8.0)

## 2017-10-13 LAB — CBC
HCT: 42.3 % (ref 40.0–52.0)
Hemoglobin: 14.7 g/dL (ref 13.0–18.0)
MCH: 30.7 pg (ref 26.0–34.0)
MCHC: 34.7 g/dL (ref 32.0–36.0)
MCV: 88.3 fL (ref 80.0–100.0)
PLATELETS: 133 10*3/uL — AB (ref 150–440)
RBC: 4.79 MIL/uL (ref 4.40–5.90)
RDW: 13.5 % (ref 11.5–14.5)
WBC: 16.2 10*3/uL — ABNORMAL HIGH (ref 3.8–10.6)

## 2017-10-13 LAB — PROTIME-INR
INR: 2.22
PROTHROMBIN TIME: 24.4 s — AB (ref 11.4–15.2)

## 2017-10-13 LAB — VANCOMYCIN, TROUGH: Vancomycin Tr: 20 ug/mL (ref 15–20)

## 2017-10-13 MED ORDER — POTASSIUM CHLORIDE CRYS ER 20 MEQ PO TBCR
20.0000 meq | EXTENDED_RELEASE_TABLET | Freq: Every day | ORAL | Status: DC
  Administered 2017-10-13: 20 meq via ORAL
  Filled 2017-10-13: qty 1

## 2017-10-13 MED ORDER — FUROSEMIDE 40 MG PO TABS
40.0000 mg | ORAL_TABLET | Freq: Every day | ORAL | Status: DC
  Administered 2017-10-13: 40 mg via ORAL
  Filled 2017-10-13: qty 1

## 2017-10-13 MED ORDER — VANCOMYCIN HCL 10 G IV SOLR
1750.0000 mg | Freq: Two times a day (BID) | INTRAVENOUS | Status: DC
  Administered 2017-10-14: 1750 mg via INTRAVENOUS
  Filled 2017-10-13 (×2): qty 1750

## 2017-10-13 NOTE — Progress Notes (Signed)
[facility] NAME: Michael Blevins    MR#:  [id number]  DATE OF BIRTH:  11-24-1950  SUBJECTIVE:  Patient seen and evaluated today Has decreased pain in the right lower extremity Redness present in the right lower extremity along with swelling No new episodes of fever No chest pain and shortness of breath   REVIEW OF SYSTEMS:    ROS  CONSTITUTIONAL: No documented fever. No fatigue, weakness. No weight gain, no weight loss.  EYES: No blurry or double vision.  ENT: No tinnitus. No postnasal drip. No redness of the oropharynx.  RESPIRATORY: No cough, no wheeze, no hemoptysis. No dyspnea.  CARDIOVASCULAR: No chest pain. No orthopnea. No palpitations. No syncope.  GASTROINTESTINAL: No nausea, no vomiting or diarrhea. No abdominal pain. No melena or hematochezia.  GENITOURINARY: No dysuria or hematuria.  ENDOCRINE: No polyuria or nocturia. No heat or cold intolerance.  HEMATOLOGY: No anemia. No bruising. No bleeding.  INTEGUMENTARY: Redness of skin right leg MUSCULOSKELETAL: No arthritis.  Swelling right leg NEUROLOGIC: No numbness, tingling, or ataxia. No seizure-type activity.  PSYCHIATRIC: No anxiety. No insomnia. No ADD.   DRUG ALLERGIES:  No Known Allergies  VITALS:  Blood pressure 140/77, pulse 88, temperature 97.8 F (36.6 C), temperature source Oral, resp. rate 20, height 6\' 1"  (1.854 m), weight 129.3 kg (285 lb), SpO2 99 %.  PHYSICAL EXAMINATION:   Physical Exam  GENERAL:  67 y.o.-year-old patient lying in the bed with no acute distress.  EYES: Pupils equal, round, reactive to light and accommodation. No scleral icterus. Extraocular muscles intact.  HEENT: Head atraumatic, normocephalic. Oropharynx and nasopharynx clear.  NECK:  Supple, no jugular venous distention. No thyroid enlargement, no tenderness.  LUNGS: Normal breath sounds bilaterally, no wheezing, rales, rhonchi. No use of accessory muscles of respiration.   CARDIOVASCULAR: S1, S2 normal. No murmurs, rubs, or gallops.  ABDOMEN: Soft, nontender, nondistended. Bowel sounds present. No organomegaly or mass.  EXTREMITIES: No cyanosis, clubbing Swelling and tenderness right leg NEUROLOGIC: Cranial nerves II through XII are intact. No focal Motor or sensory deficits b/l.   PSYCHIATRIC: The patient is alert and oriented x 3.  SKIN: Redness of skin right leg near shin area  LABORATORY PANEL:   CBC Recent Labs  Lab 10/13/17 0414  WBC 16.2*  HGB 14.7  HCT 42.3  PLT 133*   ------------------------------------------------------------------------------------------------------------------ Chemistries  Recent Labs  Lab 10/12/17 0559  NA 140  K 3.5  CL 103  CO2 26  GLUCOSE 119*  BUN 14  CREATININE 0.90  CALCIUM 8.9  AST 39  ALT 33  ALKPHOS 63  BILITOT 1.4*   ------------------------------------------------------------------------------------------------------------------  Cardiac Enzymes No results for input(s): TROPONINI in the last 168 hours. ------------------------------------------------------------------------------------------------------------------  RADIOLOGY:  US Venous Img Lower Unilateral Right  Result Date: 10/12/2017 CLINICAL DATA:  Right lower extremity pain, edema and cellulitis. History of prior great saphenous vein stripping. EXAM: RIGHT LOWER EXTREMITY VENOUS DOPPLER ULTRASOUND TECHNIQUE: Gray-scale sonography with graded compression, as well as color Doppler and duplex ultrasound were performed to evaluate the lower extremity deep venous systems from the level of the common femoral vein and including the common femoral, femoral, profunda femoral, popliteal and calf veins including the posterior tibial, peroneal and gastrocnemius veins when visible. The superficial great saphenous vein was also interrogated. Spectral Doppler was utilized to evaluate flow at rest and with distal augmentation maneuvers in the common  femoral, femoral and popliteal veins. COMPARISON:  None. FINDINGS: Contralateral Common Femoral  Vein: Respiratory phasicity is normal and symmetric with the symptomatic side. No evidence of thrombus. Normal compressibility. Common Femoral Vein: No evidence of thrombus. Normal compressibility, respiratory phasicity and response to augmentation. Saphenofemoral Junction: No evidence of thrombus. Normal compressibility and flow on color Doppler imaging. Profunda Femoral Vein: No evidence of thrombus. Normal compressibility and flow on color Doppler imaging. Femoral Vein: No evidence of thrombus. Normal compressibility, respiratory phasicity and response to augmentation. Popliteal Vein: No evidence of thrombus. Normal compressibility, respiratory phasicity and response to augmentation. Calf Veins: No evidence of thrombus. Normal compressibility and flow on color Doppler imaging. Superficial Great Saphenous Vein: Not visualized. Venous Reflux:  None. Other Findings: No evidence of superficial thrombophlebitis or abnormal fluid collection. IMPRESSION: No evidence of right lower extremity deep venous thrombosis. Electronically Signed   By: Aletta Edouard M.D.   On: 10/12/2017 09:38     ASSESSMENT AND PLAN:  67 year old male patient with history of atrial fibrillation on Coumadin for anticoagulation, venous insufficiency, gout, sleep apnea was admitted for infection in the right lower extremity  -Right leg cellulitis Improving slowly Currently on IV vancomycin and Zosyn antibiotics Follow-up cultures  -Sepsis secondary to cellulitis of the leg improved No new episodes of fevers  -Right lower extremity swelling secondary to cellulitis Doppler ultrasound of the lower extremities revealed no DVT Resume Lasix for diuresis orally Discontinue IV fluids  -DVT prophylaxis Continue oral Coumadin with INR monitoring   All the records are reviewed and case discussed with Care Management/Social  Worker. Management plans discussed with the patient, family and they are in agreement.  CODE STATUS: Full code  DVT Prophylaxis: SCDs  TOTAL TIME TAKING CARE OF THIS PATIENT: 35 minutes.   POSSIBLE D/C IN 1 to 2 DAYS, DEPENDING ON CLINICAL CONDITION.  Saundra Shelling M.D on 10/13/2017 at 12:33 PM  Between 7am to 6pm - Pager - [phone]  After 6pm go to [email or web address] - password [email or web address] [facility]  Office  [phone]  CC: Primary care physician; Einar Pheasant, MD  Note: This dictation was prepared with Dragon dictation along with smaller phrase technology. Any transcriptional errors that result from this process are unintentional.

## 2017-10-13 NOTE — Consult Note (Signed)
[facility] for Warfarin  Indication: atrial fibrillation  No Known Allergies  Patient Measurements: Height: 6\' 1"  (185.4 cm) Weight: 285 lb (129.3 kg) IBW/kg (Calculated) : 79.9 Vital Signs: Temp: 97.8 F (36.6 C) (05/06 0700) Temp Source: Oral (05/06 0700) BP: 140/77 (05/06 0700) Pulse Rate: 88 (05/06 0700)  Labs: Recent Labs    10/12/17 0559 10/12/17 1014 10/13/17 0414  HGB 17.3  --  14.7  HCT 51.0  --  42.3  PLT 187  --  133*  LABPROT  --  23.5* 24.4*  INR  --  2.11 2.22  CREATININE 0.90  --   --     Estimated Creatinine Clearance: 112.3 mL/min (by C-G formula based on SCr of 0.9 mg/dL).   Medical History: Past Medical History:  Diagnosis Date  . Allergy    cats and pollen  . Asthma   . Atrial fibrillation ([place])   . Back pain   . Cellulitis   . Gout   . History of chicken pox   . Hyperlipidemia   . Hypertension   . Kidney stone   . Sleep apnea   . Venous insufficiency     Assessment: Pharmacy consulted for warfarin dosing and monitoring in 67 yo male admitted with PMH of A. Fib, admitted with cellulitis. INR 2.11 on admission.   Home Dose: Warfarin 5mg  Sun,Mon, Tues,Wed,Thus,Sat                        Warfarin 7mg - Friday   5/5  INR 2.11  Warfarin 5 mg 5/6  INR 2.22  Goal of Therapy:  INR 2-3 Monitor platelets by anticoagulation protocol: Yes   Plan:  5/6 Will continue patient's home does of warfarin 5mg / 7 mg as above. Patient is receiving Abx so will monitor INR daily per protocol.   INR ordered with AM labs.   Noralee Space, PharmD, BCPS Clinical Pharmacist 10/13/2017 8:54 AM

## 2017-10-13 NOTE — Consult Note (Signed)
Pharmacy Antibiotic Note  Michael Blevins is a 67 y.o. male admitted on 10/12/2017 with sepsis secondary to right leg cellulitis.  Pharmacy has been consulted for vancomycin and zosyn dosing. Patient received vancomycin 1g IV x 1 dose and Zosyn 3.375 IV x 1 dose in ED  Plan: DW: 101kg  Ke: 0.096 t1/2: 7.22  Vd: 70  Start Vancomycin 1250 IV every 8 hours.  Goal trough 10-15 mcg/mL. Calculated trough at Css is 14.2. Trough level ordered prior to 4th dose.   5/6 @ 1940 VT 20 mcg/ml. Patient at risk for accumulation. Will change vancomycin dosing to 1750 mg iv q 12 hours and check another trough with the 4th dose.   Start Zosyn 3.375 IV EI every 8 hours.   Height: 6\' 1"  (185.4 cm) Weight: 285 lb (129.3 kg) IBW/kg (Calculated) : 79.9  Temp (24hrs), Avg:98.2 F (36.8 C), Min:97.8 F (36.6 C), Max:98.6 F (37 C)  Recent Labs  Lab 10/12/17 0559 10/12/17 0601 10/12/17 1014 10/12/17 1211 10/13/17 0414 10/13/17 1535  WBC 17.0*  --   --   --  16.2*  --   CREATININE 0.90  --   --   --   --  0.91  LATICACIDVEN  --  3.1* 1.6 2.3*  --   --   VANCOTROUGH  --   --   --   --   --  20    Estimated Creatinine Clearance: 111.1 mL/min (by C-G formula based on SCr of 0.91 mg/dL).    No Known Allergies  Antimicrobials this admission: 5/5 Zosyn >  5/5 vancomycin >>   Microbiology results: 5/5 BCx: pending  Thank you for allowing pharmacy to be a part of this patient's care.  Napoleon Form, PharmD, BCPS Clinical Pharmacist 10/13/2017 7:40 PM

## 2017-10-14 ENCOUNTER — Encounter: Payer: Self-pay | Admitting: Internal Medicine

## 2017-10-14 ENCOUNTER — Telehealth: Payer: Self-pay | Admitting: Internal Medicine

## 2017-10-14 LAB — CBC
HCT: 42.5 % (ref 40.0–52.0)
HEMOGLOBIN: 14.5 g/dL (ref 13.0–18.0)
MCH: 30.1 pg (ref 26.0–34.0)
MCHC: 34.2 g/dL (ref 32.0–36.0)
MCV: 88.2 fL (ref 80.0–100.0)
PLATELETS: 134 10*3/uL — AB (ref 150–440)
RBC: 4.81 MIL/uL (ref 4.40–5.90)
RDW: 13.8 % (ref 11.5–14.5)
WBC: 13.2 10*3/uL — AB (ref 3.8–10.6)

## 2017-10-14 LAB — BASIC METABOLIC PANEL
ANION GAP: 7 (ref 5–15)
BUN: 11 mg/dL (ref 6–20)
CHLORIDE: 109 mmol/L (ref 101–111)
CO2: 25 mmol/L (ref 22–32)
Calcium: 8.1 mg/dL — ABNORMAL LOW (ref 8.9–10.3)
Creatinine, Ser: 0.81 mg/dL (ref 0.61–1.24)
GFR calc Af Amer: >60 mL/min (ref >60)
GLUCOSE: 105 mg/dL — AB (ref 65–99)
Potassium: 3.3 mmol/L — ABNORMAL LOW (ref 3.5–5.1)
SODIUM: 141 mmol/L (ref 135–145)

## 2017-10-14 LAB — PROTIME-INR
INR: 2.57
Prothrombin Time: 27.4 seconds — ABNORMAL HIGH (ref 11.4–15.2)

## 2017-10-14 MED ORDER — POTASSIUM CHLORIDE CRYS ER 20 MEQ PO TBCR: 20.0000 meq | EXTENDED_RELEASE_TABLET | Freq: Every day | ORAL | 0 refills | Status: DC

## 2017-10-14 MED ORDER — FUROSEMIDE 20 MG PO TABS: 40.0000 mg | ORAL_TABLET | Freq: Every day | ORAL | 1 refills | Status: DC

## 2017-10-14 MED ORDER — CEPHALEXIN 500 MG PO CAPS: 500.0000 mg | ORAL_CAPSULE | Freq: Four times a day (QID) | ORAL | 0 refills | Status: DC

## 2017-10-14 NOTE — Telephone Encounter (Signed)
Patient scheduled see HFU note.

## 2017-10-14 NOTE — Telephone Encounter (Signed)
Transition Care Management Follow-up Telephone Call  How have you been since you were released from the hospital? Patient stated he is feeling much better.   Do you understand why you were in the hospital? yes   Do you understand the discharge instrcutions? yes  Items Reviewed:  Medications reviewed: yes  Allergies reviewed: yes  Dietary changes reviewed: yes  Referrals reviewed: yes   Functional Questionnaire:   Activities of Daily Living (ADLs):   He states they are independent in the following: ambulation, bathing and hygiene, feeding, continence, grooming, toileting and dressing States they require assistance with the following: NO assistance needed at [facility] time.   Any transportation issues/concerns?: no   Any patient concerns? no   Confirmed importance and date/time of follow-up visits scheduled: yes   Confirmed with patient if condition begins to worsen call PCP or go to the ER.  Patient was given the Call-a-Nurse line [phone]: yes

## 2017-10-14 NOTE — Consult Note (Signed)
[facility] for Warfarin  Indication: atrial fibrillation  No Known Allergies  Patient Measurements: Height: 6\' 1"  (185.4 cm) Weight: 285 lb (129.3 kg) IBW/kg (Calculated) : 79.9 Vital Signs: Temp: 98.4 F (36.9 C) (05/07 0700) Temp Source: Oral (05/07 0700) BP: 149/98 (05/07 0700) Pulse Rate: 79 (05/07 0700)  Labs: Recent Labs    10/12/17 0559 10/12/17 1014 10/13/17 0414 10/13/17 1535 10/14/17 0517  HGB 17.3  --  14.7  --  14.5  HCT 51.0  --  42.3  --  42.5  PLT 187  --  133*  --  134*  LABPROT  --  23.5* 24.4*  --  27.4*  INR  --  2.11 2.22  --  2.57  CREATININE 0.90  --   --  0.91 0.81    Estimated Creatinine Clearance: 124.8 mL/min (by C-G formula based on SCr of 0.81 mg/dL).   Medical History: Past Medical History:  Diagnosis Date  . Allergy    cats and pollen  . Asthma   . Atrial fibrillation ([place])   . Back pain   . Cellulitis   . Gout   . History of chicken pox   . Hyperlipidemia   . Hypertension   . Kidney stone   . Sleep apnea   . Venous insufficiency     Assessment: Pharmacy consulted for warfarin dosing and monitoring in 67 yo male admitted with PMH of A. Fib, admitted with cellulitis. INR 2.11 on admission.   Home Dose: Warfarin 5mg  Sun,Mon, Tues,Wed,Thus,Sat                        Warfarin 7mg - Friday   5/5  INR 2.11  Warfarin 5 mg 5/6  INR 2.22  Warfarin 5mg   5/7  INR 2.57    Goal of Therapy:  INR 2-3 Monitor platelets by anticoagulation protocol: Yes   Plan:  5/6 Will continue patient's home does of warfarin 5mg / 7 mg as above. Patient is receiving Abx so will monitor INR daily per protocol.   INR ordered with AM labs.   Pernell Dupre, PharmD, BCPS Clinical Pharmacist 10/14/2017 9:32 AM

## 2017-10-14 NOTE — Progress Notes (Signed)
Patient stated he would take all medications when he gets home.

## 2017-10-14 NOTE — Progress Notes (Signed)
Discharge summary reviewed with verbal understanding. Rxs given upon discharge. Escorted to personal vehicle via wc. 

## 2017-10-14 NOTE — Telephone Encounter (Signed)
Please advise 

## 2017-10-14 NOTE — Telephone Encounter (Unsigned)
Copied from [place] [id number]. Topic: Appointment Scheduling - Scheduling Inquiry for Clinic >> Oct 14, 2017 10:49 AM Percell Belt A wrote: Reason for CRM: [facility] called in to make an appt for a week HOSP fu with Dr Nicki Reaper.  Pt is discharging today.  They would like someone to contact pt at home with hosp fu appt

## 2017-10-14 NOTE — Discharge Summary (Signed)
[facility] NAME: Michael Blevins    MR#:  [id number]  DATE OF BIRTH:  [birth year]  DATE OF ADMISSION:  10/12/2017 ADMITTING PHYSICIAN: Saundra Shelling, MD  DATE OF DISCHARGE: 10/14/2017 11:20 AM  PRIMARY CARE PHYSICIAN: Einar Pheasant, MD   ADMISSION DIAGNOSIS:  Cellulitis of leg Lactic acidosis Leg pain Sepsis  DISCHARGE DIAGNOSIS:  Cellulitis of leg Leg pain secondary to cellulitis Lactic acidosis  Sepsis  SECONDARY DIAGNOSIS:   Past Medical History:  Diagnosis Date  . Allergy    cats and pollen  . Asthma   . Atrial fibrillation ([place])   . Back pain   . Cellulitis   . Gout   . History of chicken pox   . Hyperlipidemia   . Hypertension   . Kidney stone   . Sleep apnea   . Venous insufficiency      ADMITTING HISTORY Michael Blevins  is a 67 y.o. male with a known history of hypertension, atrial fibrillation, gout, sleep apnea, venous insufficiency on Coumadin for anticoagulation as outpatient presented to the emergency room with a swelling in the right lower extremity and pain.  Patient recently traveled from [place] driving an automobile.  He has swelling and pain in the right lower extremity with redness.  Has prior history of cellulitis in the right lower extremity.  Patient was evaluated in the emergency room he was started on IV vancomycin and Zosyn antibiotics.  His lactic acid level was elevated.  He appears dry and dehydrated.  The pain in the right leg is aching in nature 6 out of 10 on a scale of 1-10.  No complains of any chest pain, shortness of breath.hospitalist service was consulted for further care.     HOSPITAL COURSE:  Patient was admitted to medical floor. He was started on IV vancomycin and IV zosyn antibiotics.His lactic acidosis came down with iv fluid hydration and antibiotics.Cultures did not reveal any growth.Doppler USD of leg showed no DVT. Patient tolerated antibiotics well.Leg swelling  decreased. Ambulated well.Patient will be discharged to home on oral antibiotics.He will continue anticoagulation with oral coumadin at home dose regimen.  CONSULTS OBTAINED:    DRUG ALLERGIES:  No Known Allergies  DISCHARGE MEDICATIONS:   Allergies as of 10/14/2017   No Known Allergies     Medication List    TAKE these medications   amLODipine 10 MG tablet Commonly known as:  NORVASC TAKE 1 TABLET(10 MG) BY MOUTH DAILY   cephALEXin 500 MG capsule Commonly known as:  KEFLEX Take 1 capsule (500 mg total) by mouth 4 (four) times daily for 7 days.   DIGOX 0.125 MG tablet Generic drug:  digoxin TAKE 1 TABLET(0.125 MG) BY MOUTH DAILY   finasteride 5 MG tablet Commonly known as:  PROSCAR TAKE 1 TABLET(5 MG) BY MOUTH DAILY   furosemide 20 MG tablet Commonly known as:  LASIX Take 2 tablets (40 mg total) by mouth daily. What changed:    how much to take  when to take this  reasons to take this   lisinopril 20 MG tablet Commonly known as:  PRINIVIL,ZESTRIL TAKE 1 TABLET(20 MG) BY MOUTH DAILY What changed:  See the new instructions.   lovastatin 20 MG tablet Commonly known as:  MEVACOR TAKE 1 TABLET(20 MG) BY MOUTH AT BEDTIME   metoprolol tartrate 50 MG tablet Commonly known as:  LOPRESSOR TAKE 1 TABLET(50 MG) BY MOUTH TWICE DAILY   potassium chloride SA 20 MEQ tablet  Commonly known as:  K-DUR,KLOR-CON Take 1 tablet (20 mEq total) by mouth daily.   warfarin 5 MG tablet Commonly known as:  COUMADIN Take as directed. If you are unsure how to take this medication, talk to your nurse or doctor. Original instructions:  TAKE 1 TABLET BY MOUTH AS DIRECTED BY THE COUMADIN CLINIC       Today  Patient seen today Decreased pain and swelling in the leg No fever Ok apetite VITAL SIGNS:  Blood pressure (!) 149/98, pulse 79, temperature 98.4 F (36.9 C), temperature source Oral, resp. rate 20, height 6\' 1"  (1.854 m), weight 129.3 kg (285 lb), SpO2 96 %.  I/O:     Intake/Output Summary (Last 24 hours) at 10/14/2017 1945 Last data filed at 10/14/2017 0947 Gross per 24 hour  Intake 740 ml  Output 1300 ml  Net -560 ml    PHYSICAL EXAMINATION:  Physical Exam  GENERAL:  67 y.o.-year-old patient lying in the bed with no acute distress.  LUNGS: Normal breath sounds bilaterally, no wheezing, rales,rhonchi or crepitation. No use of accessory muscles of respiration.  CARDIOVASCULAR: S1, S2 normal. No murmurs, rubs, or gallops.  ABDOMEN: Soft, non-tender, non-distended. Bowel sounds present. No organomegaly or mass.  Extremities ; Right leg swelling decreased NEUROLOGIC: Moves all 4 extremities. PSYCHIATRIC: The patient is alert and oriented x 3.  SKIN: redness of skin right leg decreased  DATA REVIEW:   CBC Recent Labs  Lab 10/14/17 0517  WBC 13.2*  HGB 14.5  HCT 42.5  PLT 134*    Chemistries  Recent Labs  Lab 10/12/17 0559  10/14/17 0517  NA 140  --  141  K 3.5  --  3.3*  CL 103  --  109  CO2 26  --  25  GLUCOSE 119*  --  105*  BUN 14  --  11  CREATININE 0.90   < > 0.81  CALCIUM 8.9  --  8.1*  AST 39  --   --   ALT 33  --   --   ALKPHOS 63  --   --   BILITOT 1.4*  --   --    < > = values in this interval not displayed.    Cardiac Enzymes No results for input(s): TROPONINI in the last 168 hours.  Microbiology Results  Results for orders placed or performed during the hospital encounter of 10/12/17  Blood culture (routine x 2)     Status: None (Preliminary result)   Collection Time: 10/12/17  7:07 AM  Result Value Ref Range Status   Specimen Description BLOOD R AC  Final   Special Requests   Final    BOTTLES DRAWN AEROBIC AND ANAEROBIC Blood Culture results may not be optimal due to an excessive volume of blood received in culture bottles   Culture   Final    NO GROWTH 2 DAYS Performed at [facility], [address] [place], [place] [address]    Report Status PENDING  Incomplete  Blood culture (routine x  2)     Status: None (Preliminary result)   Collection Time: 10/12/17  7:07 AM  Result Value Ref Range Status   Specimen Description BLOOD RT UPPER ARM  Final   Special Requests   Final    BOTTLES DRAWN AEROBIC AND ANAEROBIC Blood Culture adequate volume   Culture   Final    NO GROWTH 2 DAYS Performed at [facility], [address], [place], [place] [address]  Report Status PENDING  Incomplete    RADIOLOGY:  No results found.  Follow up with PCP in 1 week.  Management plans discussed with the patient, family and they are in agreement.  CODE STATUS: Full code Code Status History    Date Active Date Inactive Code Status Order ID Comments User Context   10/12/2017 0943 10/14/2017 1426 Full Code [id number]  Saundra Shelling, MD Inpatient      TOTAL TIME TAKING CARE OF THIS PATIENT ON DAY OF DISCHARGE: more than 32 minutes.   Saundra Shelling M.D on 10/14/2017 at 7:45 PM  Between 7am to 6pm - Pager - [phone]  After 6pm go to [email or web address] - password [email or web address] [facility]  Office  [phone]  CC: Primary care physician; Einar Pheasant, MD  Note: This dictation was prepared with Dragon dictation along with smaller phrase technology. Any transcriptional errors that result from this process are unintentional.

## 2017-10-17 ENCOUNTER — Ambulatory Visit (INDEPENDENT_AMBULATORY_CARE_PROVIDER_SITE_OTHER): Payer: Medicare Other | Admitting: Internal Medicine

## 2017-10-17 ENCOUNTER — Encounter: Payer: Self-pay | Admitting: Internal Medicine

## 2017-10-17 VITALS — BP 138/84 | HR 57 | Temp 98.0°F | Resp 18 | Wt 295.2 lb

## 2017-10-17 DIAGNOSIS — D72829 Elevated white blood cell count, unspecified: Secondary | ICD-10-CM

## 2017-10-17 DIAGNOSIS — L03115 Cellulitis of right lower limb: Secondary | ICD-10-CM

## 2017-10-17 DIAGNOSIS — I872 Venous insufficiency (chronic) (peripheral): Secondary | ICD-10-CM

## 2017-10-17 DIAGNOSIS — I10 Essential (primary) hypertension: Secondary | ICD-10-CM | POA: Diagnosis not present

## 2017-10-17 DIAGNOSIS — E876 Hypokalemia: Secondary | ICD-10-CM

## 2017-10-17 DIAGNOSIS — I482 Chronic atrial fibrillation, unspecified: Secondary | ICD-10-CM

## 2017-10-17 LAB — BASIC METABOLIC PANEL
BUN: 11 mg/dL (ref 6–23)
CHLORIDE: 102 meq/L (ref 96–112)
CO2: 31 mEq/L (ref 19–32)
CREATININE: 0.92 mg/dL (ref 0.40–1.50)
Calcium: 9.4 mg/dL (ref 8.4–10.5)
GFR: 87.09 mL/min (ref >60.00)
Glucose, Bld: 76 mg/dL (ref 70–99)
POTASSIUM: 3.9 meq/L (ref 3.5–5.1)
Sodium: 141 mEq/L (ref 135–145)

## 2017-10-17 LAB — CBC WITH DIFFERENTIAL/PLATELET
BASOS ABS: 0.1 10*3/uL (ref 0.0–0.1)
Basophils Relative: 0.6 % (ref 0.0–3.0)
EOS ABS: 0.3 10*3/uL (ref 0.0–0.7)
Eosinophils Relative: 2.5 % (ref 0.0–5.0)
HCT: 48.7 % (ref 39.0–52.0)
Hemoglobin: 16.5 g/dL (ref 13.0–17.0)
LYMPHS ABS: 2.2 10*3/uL (ref 0.7–4.0)
LYMPHS PCT: 18.2 % (ref 12.0–46.0)
MCHC: 33.9 g/dL (ref 30.0–36.0)
MCV: 88.7 fl (ref 78.0–100.0)
Monocytes Absolute: 0.9 10*3/uL (ref 0.1–1.0)
Monocytes Relative: 7.5 % (ref 3.0–12.0)
NEUTROS ABS: 8.5 10*3/uL — AB (ref 1.4–7.7)
Neutrophils Relative %: 71.2 % (ref 43.0–77.0)
PLATELETS: 261 10*3/uL (ref 150.0–400.0)
RBC: 5.5 Mil/uL (ref 4.22–5.81)
RDW: 13.8 % (ref 11.5–15.5)
WBC: 11.9 10*3/uL — ABNORMAL HIGH (ref 4.0–10.5)

## 2017-10-17 LAB — CULTURE, BLOOD (ROUTINE X 2)
Culture: NO GROWTH
Culture: NO GROWTH
Special Requests: ADEQUATE

## 2017-10-17 LAB — HEPATIC FUNCTION PANEL
ALK PHOS: 58 U/L (ref 39–117)
ALT: 45 U/L (ref 0–53)
AST: 38 U/L — ABNORMAL HIGH (ref 0–37)
Albumin: 3.9 g/dL (ref 3.5–5.2)
BILIRUBIN DIRECT: 0.2 mg/dL (ref 0.0–0.3)
Total Bilirubin: 0.9 mg/dL (ref 0.2–1.2)
Total Protein: 7.6 g/dL (ref 6.0–8.3)

## 2017-10-17 MED ORDER — CEPHALEXIN 500 MG PO CAPS: 500.0000 mg | ORAL_CAPSULE | Freq: Four times a day (QID) | ORAL | 0 refills | Status: AC

## 2017-10-17 NOTE — Progress Notes (Signed)
Patient ID: Michael Blevins, male   DOB: 1951/03/23, 67 y.o.   MRN: [id number]   Subjective:    Patient ID: Michael Blevins, male    DOB: Jun 09, 1951, 66 y.o.   MRN: [id number]  HPI  Patient here for hospital follow up.  Was admitted 10/12/17 and diagnosed with cellulitis right lower extremity.  He had just returned from long distance trip.  Developed chills.  Took one avelox.  Subsequently developed leg swelling and redness.  To ER and admitted.  Placed on IV vancomycin and Zosyn.  Lactic acid level was initially elevated, but decreased as infection was treated and he was hydrated.  Discharged on oral keflex.  Leg continues to be swollen and red, but improves daily - per pt.  No sob.  Eating.  No nausea or vomiting.  No diarrhea.     Past Medical History:  Diagnosis Date  . Allergy    cats and pollen  . Asthma   . Atrial fibrillation ([place])   . Back pain   . Cellulitis   . Gout   . History of chicken pox   . Hyperlipidemia   . Hypertension   . Kidney stone   . Sleep apnea   . Venous insufficiency    Past Surgical History:  Procedure Laterality Date  . APPENDECTOMY    . CARDIAC CATHETERIZATION     Family History  Problem Relation Age of Onset  . Hypertension Mother   . Diabetes Father   . Kidney disease Neg Hx   . Prostate cancer Neg Hx    Social History   Socioeconomic History  . Marital status: Married    Spouse name: Not on file  . Number of children: Not on file  . Years of education: Not on file  . Highest education level: Not on file  Occupational History  . Occupation: retired  Scientific laboratory technician  . Financial resource strain: Not on file  . Food insecurity:    Worry: Not on file    Inability: Not on file  . Transportation needs:    Medical: Not on file    Non-medical: Not on file  Tobacco Use  . Smoking status: Former Research scientist (life sciences)  . Smokeless tobacco: Never Used  . Tobacco comment: quit 33 years  Substance and Sexual Activity  . Alcohol use: No    Alcohol/week:  0.0 oz  . Drug use: No  . Sexual activity: Not on file  Lifestyle  . Physical activity:    Days per week: Not on file    Minutes per session: Not on file  . Stress: Not on file  Relationships  . Social connections:    Talks on phone: Not on file    Gets together: Not on file    Attends religious service: Not on file    Active member of club or organization: Not on file    Attends meetings of clubs or organizations: Not on file    Relationship status: Not on file  Other Topics Concern  . Not on file  Social History Narrative  . Not on file    Outpatient Encounter Medications as of 10/17/2017  Medication Sig  . amLODipine (NORVASC) 10 MG tablet TAKE 1 TABLET(10 MG) BY MOUTH DAILY  . cephALEXin (KEFLEX) 500 MG capsule Take 1 capsule (500 mg total) by mouth 4 (four) times daily for 7 days.  Marland Kitchen DIGOX 125 MCG tablet TAKE 1 TABLET(0.125 MG) BY MOUTH DAILY  . finasteride (PROSCAR) 5 MG tablet TAKE  1 TABLET(5 MG) BY MOUTH DAILY  . furosemide (LASIX) 20 MG tablet Take 2 tablets (40 mg total) by mouth daily.  Marland Kitchen lisinopril (PRINIVIL,ZESTRIL) 20 MG tablet TAKE 1 TABLET(20 MG) BY MOUTH DAILY  . lovastatin (MEVACOR) 20 MG tablet TAKE 1 TABLET(20 MG) BY MOUTH AT BEDTIME  . metoprolol tartrate (LOPRESSOR) 50 MG tablet TAKE 1 TABLET(50 MG) BY MOUTH TWICE DAILY  . potassium chloride SA (K-DUR,KLOR-CON) 20 MEQ tablet Take 1 tablet (20 mEq total) by mouth daily.  Marland Kitchen warfarin (COUMADIN) 5 MG tablet TAKE 1 TABLET BY MOUTH AS DIRECTED BY THE COUMADIN CLINIC  . [DISCONTINUED] cephALEXin (KEFLEX) 500 MG capsule Take 1 capsule (500 mg total) by mouth 4 (four) times daily for 7 days.   No facility-administered encounter medications on file as of 10/17/2017.     Review of Systems  Constitutional: Negative for appetite change and unexpected weight change.  Respiratory: Negative for chest tightness and shortness of breath.   Cardiovascular: Positive for leg swelling. Negative for chest pain.    Gastrointestinal: Negative for abdominal pain, diarrhea, nausea and vomiting.  Musculoskeletal: Negative for joint swelling and myalgias.  Skin:       Redness - right lower extremity.  Stasis changes.    Psychiatric/Behavioral: Negative for agitation and dysphoric mood.       Objective:    Physical Exam  Constitutional: He appears well-developed and well-nourished. No distress.  HENT:  Nose: Nose normal.  Mouth/Throat: Oropharynx is clear and moist.  Neck: Neck supple.  Cardiovascular: Normal rate and regular rhythm.  Pulmonary/Chest: Effort normal and breath sounds normal. No respiratory distress.  Abdominal: Soft. Bowel sounds are normal. There is no tenderness.  Musculoskeletal: He exhibits no tenderness.  Increased right lower extremity swelling and redness - per pt improved.    Lymphadenopathy:    He has no cervical adenopathy.  Skin: There is erythema.  Stasis changes noted.    Psychiatric: He has a normal mood and affect. His behavior is normal.    BP 138/84 (BP Location: Left Arm, Patient Position: Sitting, Cuff Size: Large)   Pulse (!) 57   Temp 98 F (36.7 C) (Oral)   Resp 18   Wt 295 lb 3.2 oz (133.9 kg)   SpO2 98%   BMI 38.95 kg/m  Wt Readings from Last 3 Encounters:  10/17/17 295 lb 3.2 oz (133.9 kg)  10/12/17 285 lb (129.3 kg)  07/31/17 297 lb 12.8 oz (135.1 kg)     Lab Results  Component Value Date   WBC 11.9 (H) 10/17/2017   HGB 16.5 10/17/2017   HCT 48.7 10/17/2017   PLT 261.0 10/17/2017   GLUCOSE 76 10/17/2017   CHOL 132 08/05/2017   TRIG 143.0 08/05/2017   HDL 33.20 (L) 08/05/2017   LDLCALC 70 08/05/2017   ALT 45 10/17/2017   AST 38 (H) 10/17/2017   NA 141 10/17/2017   K 3.9 10/17/2017   CL 102 10/17/2017   CREATININE 0.92 10/17/2017   BUN 11 10/17/2017   CO2 31 10/17/2017   TSH 2.41 01/21/2017   PSA 3.49 11/08/2015   INR 2.57 10/14/2017   HGBA1C 5.5 08/05/2017    US Venous Img Lower Unilateral Right  Result Date:  10/12/2017 CLINICAL DATA:  Right lower extremity pain, edema and cellulitis. History of prior great saphenous vein stripping. EXAM: RIGHT LOWER EXTREMITY VENOUS DOPPLER ULTRASOUND TECHNIQUE: Gray-scale sonography with graded compression, as well as color Doppler and duplex ultrasound were performed to evaluate the lower extremity deep venous  systems from the level of the common femoral vein and including the common femoral, femoral, profunda femoral, popliteal and calf veins including the posterior tibial, peroneal and gastrocnemius veins when visible. The superficial great saphenous vein was also interrogated. Spectral Doppler was utilized to evaluate flow at rest and with distal augmentation maneuvers in the common femoral, femoral and popliteal veins. COMPARISON:  None. FINDINGS: Contralateral Common Femoral Vein: Respiratory phasicity is normal and symmetric with the symptomatic side. No evidence of thrombus. Normal compressibility. Common Femoral Vein: No evidence of thrombus. Normal compressibility, respiratory phasicity and response to augmentation. Saphenofemoral Junction: No evidence of thrombus. Normal compressibility and flow on color Doppler imaging. Profunda Femoral Vein: No evidence of thrombus. Normal compressibility and flow on color Doppler imaging. Femoral Vein: No evidence of thrombus. Normal compressibility, respiratory phasicity and response to augmentation. Popliteal Vein: No evidence of thrombus. Normal compressibility, respiratory phasicity and response to augmentation. Calf Veins: No evidence of thrombus. Normal compressibility and flow on color Doppler imaging. Superficial Great Saphenous Vein: Not visualized. Venous Reflux:  None. Other Findings: No evidence of superficial thrombophlebitis or abnormal fluid collection. IMPRESSION: No evidence of right lower extremity deep venous thrombosis. Electronically Signed   By: Aletta Edouard M.D.   On: 10/12/2017 09:38       Assessment & Plan:    Problem List Items Addressed This Visit    Atrial fibrillation ([place])    On coumadin.  Followed by cardiology.        Cellulitis    Right lower extremity cellulitis.  Initially treated with IV zosyn and IV vancomycin.  On keflex now and leg continuing to get better. Decreased swelling and redness.  Extend out keflex.  Given persistent issues with lower extremity swelling and venostasis, will refer to vascular surgery for further evaluation and treatment.       Essential hypertension    Blood pressure under good control.  Continue same medication regimen.  Follow pressures.  Follow metabolic panel.        Venous insufficiency    Wears compression hose.  Persistent issues with lower extremity swelling.  Recently admitted with cellulitis.  On keflex.  Refer to vascular surgery for further evaluation and treatment.         Other Visit Diagnoses    Leukocytosis, unspecified type    -  Primary   Relevant Orders   CBC with Differential/Platelet (Completed)   Hyperbilirubinemia       Relevant Orders   Hepatic function panel (Completed)   Hypokalemia       Relevant Orders   Basic metabolic panel (Completed)       Einar Pheasant, MD

## 2017-10-19 ENCOUNTER — Encounter: Payer: Self-pay | Admitting: Internal Medicine

## 2017-10-19 NOTE — Assessment & Plan Note (Signed)
Blood pressure under good control.  Continue same medication regimen.  Follow pressures.  Follow metabolic panel.   

## 2017-10-19 NOTE — Assessment & Plan Note (Signed)
Right lower extremity cellulitis.  Initially treated with IV zosyn and IV vancomycin.  On keflex now and leg continuing to get better. Decreased swelling and redness.  Extend out keflex.  Given persistent issues with lower extremity swelling and venostasis, will refer to vascular surgery for further evaluation and treatment.

## 2017-10-19 NOTE — Assessment & Plan Note (Signed)
Wears compression hose.  Persistent issues with lower extremity swelling.  Recently admitted with cellulitis.  On keflex.  Refer to vascular surgery for further evaluation and treatment.

## 2017-10-19 NOTE — Assessment & Plan Note (Signed)
On coumadin.  Followed by cardiology.  

## 2017-10-20 ENCOUNTER — Encounter: Payer: Self-pay | Admitting: Internal Medicine

## 2017-10-20 ENCOUNTER — Other Ambulatory Visit: Payer: Self-pay | Admitting: Internal Medicine

## 2017-10-20 DIAGNOSIS — I872 Venous insufficiency (chronic) (peripheral): Secondary | ICD-10-CM

## 2017-10-20 DIAGNOSIS — L03115 Cellulitis of right lower limb: Secondary | ICD-10-CM

## 2017-10-20 DIAGNOSIS — M7989 Other specified soft tissue disorders: Secondary | ICD-10-CM

## 2017-10-20 NOTE — Progress Notes (Signed)
Order placed for vascular surgery referral.   

## 2017-10-23 IMAGING — CT CT ABD-PEL WO/W CM
1 of 4 series · 8 of 32 positions shown, 13 images · IV contrast (iopamidol)
Comparison: CT 10/07/2011

CLINICAL DATA: Patient had two episodes of painless gross hematuria
18 months apart. Patient doesn't have a prior history of hematuria.
Hx Kidney stones and BPH.

EXAM:
CT ABDOMEN AND PELVIS WITHOUT AND WITH CONTRAST
TECHNIQUE: Multidetector CT imaging of the abdomen and pelvis was performed
following the standard protocol before and following the bolus
administration of intravenous contrast.
CONTRAST:  125mL 0TVHAD-GMM IOPAMIDOL (0TVHAD-GMM) INJECTION 61%

[Series 12: axial delay · axial · delayed · 0.92mm/px · z∈[-1021,-601]mm · 8 of 109 slices shown, 13 images]
[im 13/109  soft-tissue]
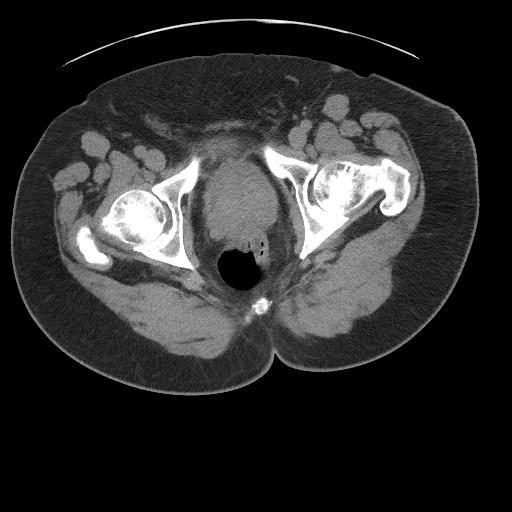
[im 13/109  bone]
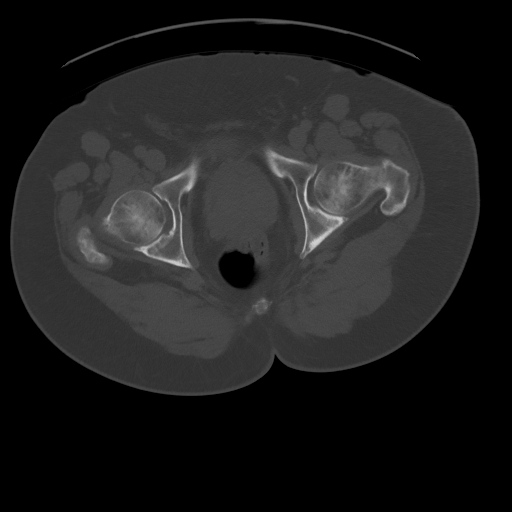
[im 25/109  soft-tissue]
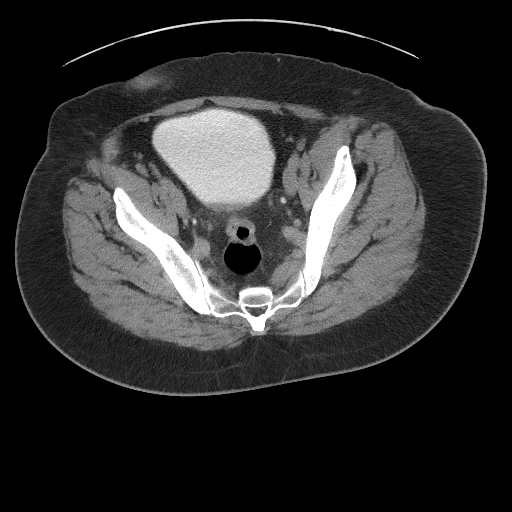
[im 37/109  soft-tissue]
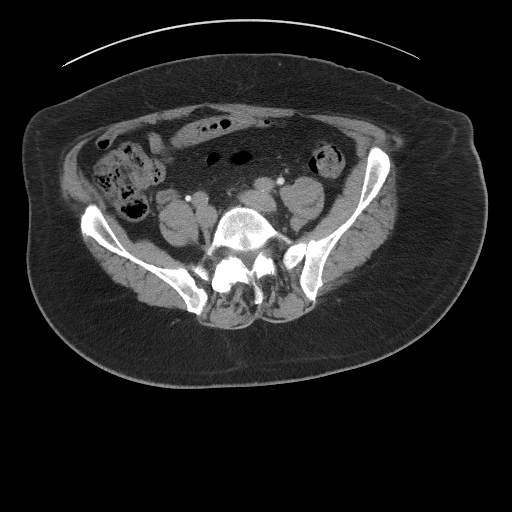
[im 49/109  soft-tissue]
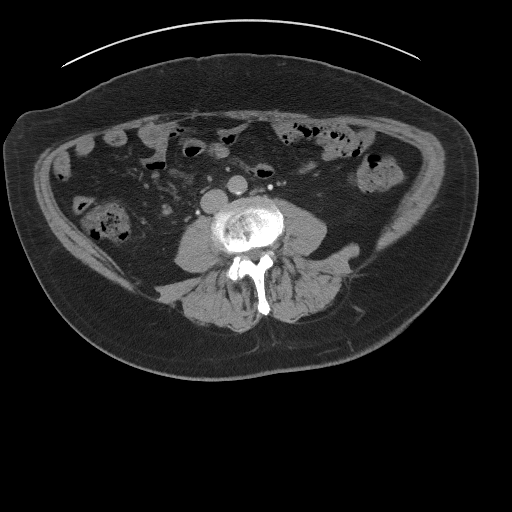
[im 61/109  soft-tissue]
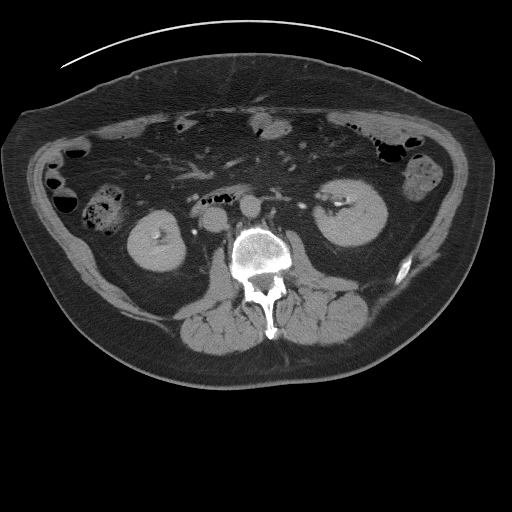
[im 61/109  lung]
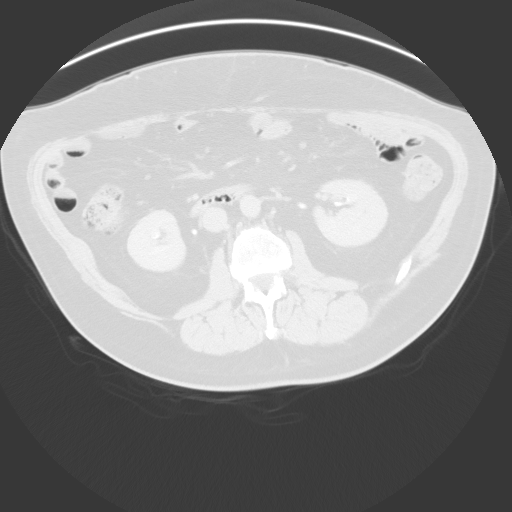
[im 73/109  soft-tissue]
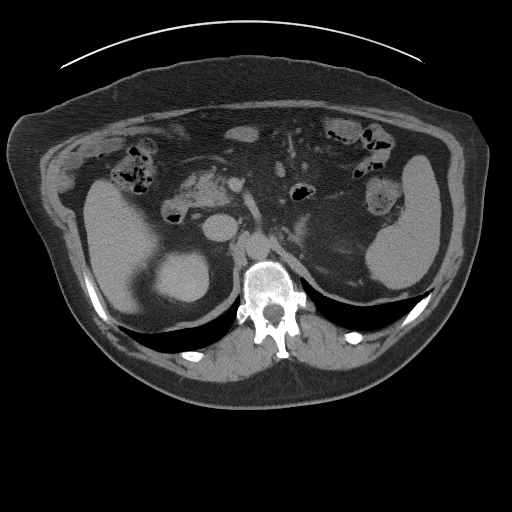
[im 73/109  lung]
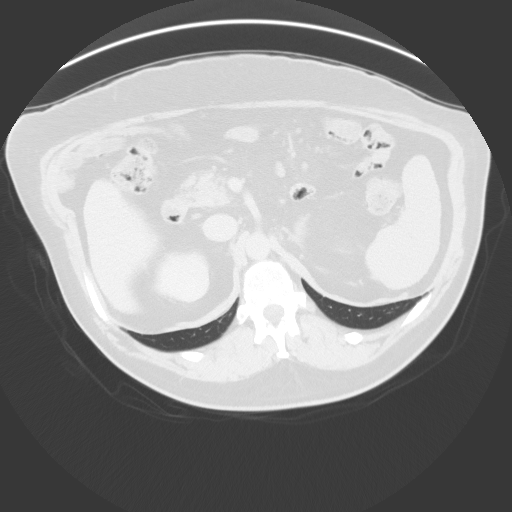
[im 85/109  soft-tissue]
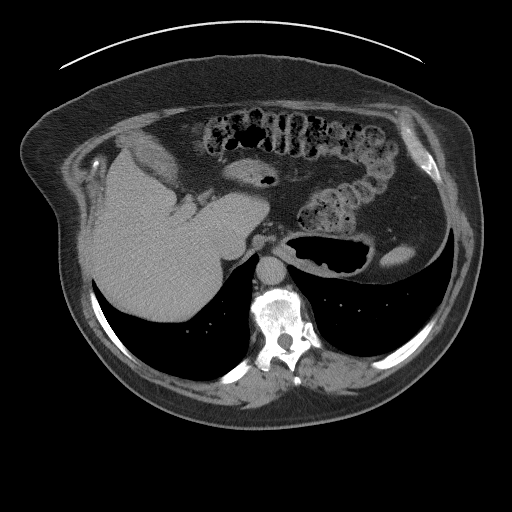
[im 85/109  lung]
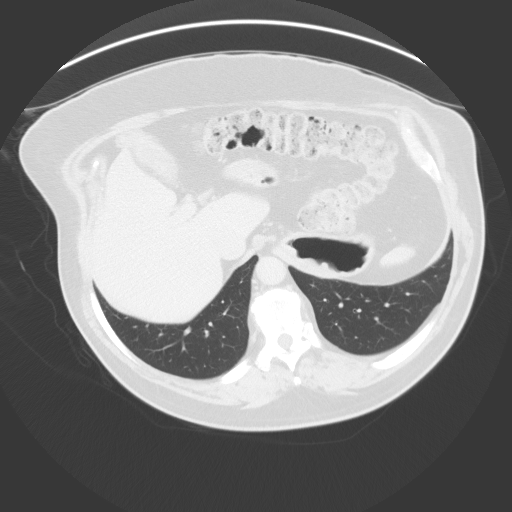
[im 97/109  soft-tissue]
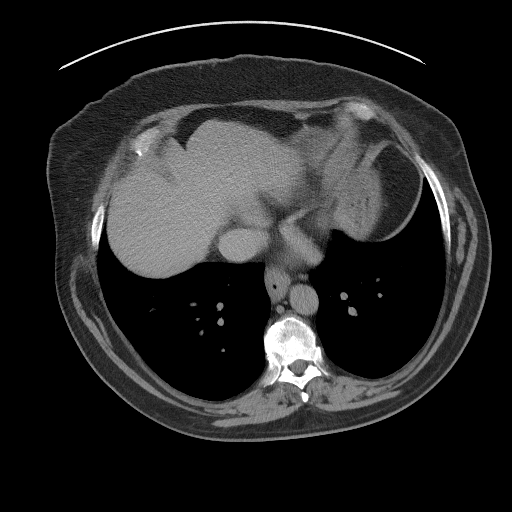
[im 97/109  lung]
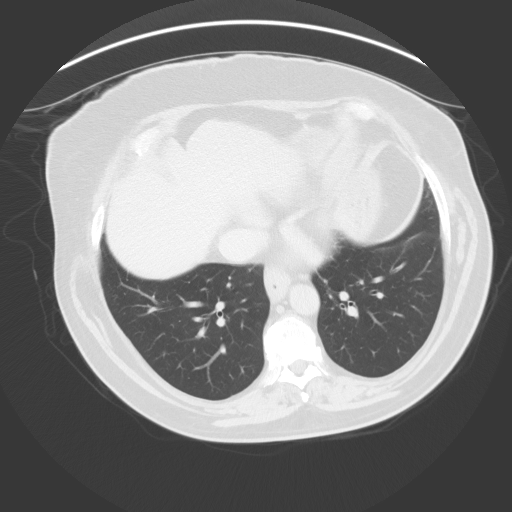

[8 of 32 positions shown; findings below may reference images not displayed]

FINDINGS: Lower chest: Lung bases are clear.

Hepatobiliary: Normal liver. Small gallstone present. No biliary
duct dilatation. Common bile duct normal.

Pancreas: Pancreas is normal. No ductal dilatation. No pancreatic
inflammation.

Spleen: Normal spleen

Adrenals/urinary tract: Adrenal glands are normal.

No nephrolithiasis for ureterolithiasis.

No enhancing renal cortical lesion. No filling defect within the
collecting systems or ureters.

No bladder calculi or enhancing bladder lesion. Enlarged prostate
gland elevates the base the bladder. One nodular filling defect base
the bladder measures 6 mm (image 89, series 12). This is adjacent to
and likely an extension of the prostate nodular hypertrophy.

Stomach/Bowel: Stomach, small bowel, appendix, and cecum are normal.
The colon and rectosigmoid colon are normal.

Vascular/Lymphatic: Abdominal aorta is normal caliber. There is no
retroperitoneal or periportal lymphadenopathy. No pelvic
lymphadenopathy.

Reproductive: Prostate gland is enlarged and elevates base the
bladder as above. Prostate gland measures 71 mm in diameter

Other: No free fluid.

Musculoskeletal: No aggressive osseous lesion.
IMPRESSION: 1. No explanation for hematuria. No nephrolithiasis,
ureterolithiasis, enhancing renal cortical lesion, or filling
defects within the collecting systems.
2. Enlarged prostate gland elevates base of bladder. One filling
defect at the base the bladder is favored an extension of the
prostate hypertrophy but not cannot completely exclude a papillary
bladder lesion.

## 2017-10-26 ENCOUNTER — Other Ambulatory Visit: Payer: Self-pay | Admitting: Cardiovascular Disease

## 2017-10-27 ENCOUNTER — Ambulatory Visit (INDEPENDENT_AMBULATORY_CARE_PROVIDER_SITE_OTHER): Payer: Medicare Other

## 2017-10-27 DIAGNOSIS — I482 Chronic atrial fibrillation, unspecified: Secondary | ICD-10-CM

## 2017-10-27 DIAGNOSIS — Z5181 Encounter for therapeutic drug level monitoring: Secondary | ICD-10-CM

## 2017-10-27 LAB — POCT INR: INR: 2

## 2017-10-27 NOTE — Patient Instructions (Signed)
Please take 7 mg today, then resume on same dosage 5mg daily except 7mg on Fridays.   Please be consistent with your greens. Recheck in 6 weeks. 

## 2017-10-28 ENCOUNTER — Ambulatory Visit (INDEPENDENT_AMBULATORY_CARE_PROVIDER_SITE_OTHER): Payer: Medicare Other | Admitting: Vascular Surgery

## 2017-10-28 ENCOUNTER — Encounter (INDEPENDENT_AMBULATORY_CARE_PROVIDER_SITE_OTHER): Payer: Self-pay | Admitting: Vascular Surgery

## 2017-10-28 VITALS — BP 126/84 | HR 68 | Resp 17 | Ht 73.0 in | Wt 289.0 lb

## 2017-10-28 DIAGNOSIS — I890 Lymphedema, not elsewhere classified: Secondary | ICD-10-CM | POA: Insufficient documentation

## 2017-10-28 DIAGNOSIS — L03115 Cellulitis of right lower limb: Secondary | ICD-10-CM | POA: Diagnosis not present

## 2017-10-28 DIAGNOSIS — I872 Venous insufficiency (chronic) (peripheral): Secondary | ICD-10-CM

## 2017-10-28 NOTE — Progress Notes (Signed)
Subjective:    Patient ID: Michael Blevins, male    DOB: 18-Aug-1950, 67 y.o.   MRN: [id number] Chief Complaint  Patient presents with  . New Patient (Initial Visit)    Bilateral Lower ext swelling   Presents as a new patient referred by Dr. Nicki Reaper for evaluation of bilateral lower extremity edema.  The patient is seen with his wife.  The patient notes a long-standing history of experiencing swelling to the bilateral legs.  The patient notes that the swelling to his right lower extremity is worse when compared to the left. Patient states he is undergone laser ablation to what sounds like the right small saphenous veins about 10 years ago.  The patient has noticed progressively worsening swelling to the right lower extremity over the last few weeks.  The patient was recently hospitalized for a bout of cellulitis to the right lower extremity which required IV antibiotics.  The patient has been wearing/engaging in conservative therapy including wearing medical grade 1 compression socks, elevating his legs and remaining active for many years.  The patient does have past medical documentation to prove this.  The patient's symptoms have progressively worsened even though he is diligent in a complying with conservative therapy.  The patient states that his symptoms have progressed to the point that he is unable to function on a daily basis and have become lifestyle limiting.  The patient denies any recent surgery or trauma to the bilateral lower extremity.  The patient denies any ulcer formation to the bilateral lower extremity.  The patient denies any fever, nausea vomiting.  Review of Systems  Constitutional: Negative.   HENT: Negative.   Eyes: Negative.   Respiratory: Negative.   Cardiovascular: Positive for leg swelling.       Right lower extremity recurrent cellulitis  Gastrointestinal: Negative.   Endocrine: Negative.   Genitourinary: Negative.   Musculoskeletal: Negative.   Skin: Negative.     Allergic/Immunologic: Negative.   Neurological: Negative.   Hematological: Negative.   Psychiatric/Behavioral: Negative.       Objective:   Physical Exam  Constitutional: He is oriented to person, place, and time. He appears well-developed and well-nourished. No distress.  HENT:  Head: Normocephalic and atraumatic.  Right Ear: External ear normal.  Left Ear: External ear normal.  Eyes: Pupils are equal, round, and reactive to light. Conjunctivae and EOM are normal.  Neck: Normal range of motion.  Cardiovascular: Normal rate, regular rhythm and normal heart sounds.  Hard to palpate pedal pulses due to body habitus and edema however the bilateral feet are warm  Pulmonary/Chest: Effort normal and breath sounds normal.  Musculoskeletal: Normal range of motion. He exhibits edema (Right lower extremity: Moderate nonpitting edema noted.  Left lower extremity: Mild nonpitting edema noted).  Neurological: He is alert and oriented to person, place, and time.  Skin: He is not diaphoretic.  Right lower extremity: Severe stasis dermatitis noted to the calf.  Fibrosis of skin noted. Mixture of less than 1 cm and greater than 1 cm scattered varicosities to the bilateral lower extremity. There is no active cellulitis or ulcerations noted to the bilateral lower extremity.  Psychiatric: He has a normal mood and affect. His behavior is normal. Judgment and thought content normal.  Vitals reviewed.  BP 126/84 (BP Location: Right Arm, Patient Position: Sitting)   Pulse 68   Resp 17   Ht 6\' 1"  (1.854 m)   Wt 289 lb (131.1 kg)   BMI 38.13 kg/m  Past Medical History:  Diagnosis Date  . Allergy    cats and pollen  . Asthma   . Atrial fibrillation ([place])   . Back pain   . Cellulitis   . Gout   . History of chicken pox   . Hyperlipidemia   . Hypertension   . Kidney stone   . Sleep apnea   . Venous insufficiency    Social History   Socioeconomic History  . Marital status: Married     Spouse name: Not on file  . Number of children: Not on file  . Years of education: Not on file  . Highest education level: Not on file  Occupational History  . Occupation: retired  Scientific laboratory technician  . Financial resource strain: Not on file  . Food insecurity:    Worry: Not on file    Inability: Not on file  . Transportation needs:    Medical: Not on file    Non-medical: Not on file  Tobacco Use  . Smoking status: Former Research scientist (life sciences)  . Smokeless tobacco: Never Used  . Tobacco comment: quit 33 years  Substance and Sexual Activity  . Alcohol use: No    Alcohol/week: 0.0 oz  . Drug use: No  . Sexual activity: Not on file  Lifestyle  . Physical activity:    Days per week: Not on file    Minutes per session: Not on file  . Stress: Not on file  Relationships  . Social connections:    Talks on phone: Not on file    Gets together: Not on file    Attends religious service: Not on file    Active member of club or organization: Not on file    Attends meetings of clubs or organizations: Not on file    Relationship status: Not on file  . Intimate partner violence:    Fear of current or ex partner: Not on file    Emotionally abused: Not on file    Physically abused: Not on file    Forced sexual activity: Not on file  Other Topics Concern  . Not on file  Social History Narrative  . Not on file   Past Surgical History:  Procedure Laterality Date  . APPENDECTOMY    . CARDIAC CATHETERIZATION     Family History  Problem Relation Age of Onset  . Hypertension Mother   . Diabetes Father   . Kidney disease Neg Hx   . Prostate cancer Neg Hx    No Known Allergies     Assessment & Plan:  Presents as a new patient referred by Dr. Nicki Reaper for evaluation of bilateral lower extremity edema.  The patient is seen with his wife.  The patient notes a long-standing history of experiencing swelling to the bilateral legs.  The patient notes that the swelling to his right lower extremity is worse when  compared to the left. Patient states he is undergone laser ablation to what sounds like the right small saphenous veins about 10 years ago.  The patient has noticed progressively worsening swelling to the right lower extremity over the last few weeks.  The patient was recently hospitalized for a bout of cellulitis to the right lower extremity which required IV antibiotics.  The patient has been wearing/engaging in conservative therapy including wearing medical grade 1 compression socks, elevating his legs and remaining active for many years.  The patient does have past medical documentation to prove this.  The patient's symptoms have progressively worsened even though  he is diligent in a complying with conservative therapy.  The patient states that his symptoms have progressed to the point that he is unable to function on a daily basis and have become lifestyle limiting.  The patient denies any recent surgery or trauma to the bilateral lower extremity.  The patient denies any ulcer formation to the bilateral lower extremity.  The patient denies any fever, nausea vomiting.  1. Venous insufficiency - New Patient does have a past medical history of venous insufficiency and endovenous laser ablation to what sounds like the right small saphenous vein I will bring the patient back and have him undergo a bilateral venous ultrasound to assess for any recannulation / new / worsening venous insufficiency In the meantime, the patient was encouraged to continue engaging in conservative therapy including wearing graduated compression stockings (20-30 mmHg) on a daily basis. The patient was instructed to begin wearing the stockings first thing in the morning and removing them in the evening. The patient was instructed specifically not to sleep in the stockings.  The patient is to continue elevation during the day. Anti-inflammatories for pain. The patient is found to have a recannulized vein or new/worsening venous reflux  he may be a candidate for likely to benefit from endovenous laser ablation.  The patient may also be a candidate for lymphedema pump We discussed both options today I will also bring the patient back and have him undergo an ABI as he does have risk factors for peripheral artery disease which can contribute to cellulitis.  The patient has had a few bouts with cellulitis the most recent being the most severe requiring inpatient stay for IV antibiotics. The patient was instructed to call the office in the interim if any worsening edema or ulcerations to the legs, feet or toes occurs. The patient expresses their understanding.  - VAS [place] LOWER EXTREMITY VENOUS REFLUX; Future  2. Lymphedema - New As above  3. Cellulitis of right lower extremity - New As above  - VAS [place] ABI WITH/WO TBI; Future  Current Outpatient Medications on File Prior to Visit  Medication Sig Dispense Refill  . amLODipine (NORVASC) 10 MG tablet TAKE 1 TABLET(10 MG) BY MOUTH DAILY 90 tablet 2  . DIGOX 125 MCG tablet TAKE 1 TABLET(0.125 MG) BY MOUTH DAILY 90 tablet 2  . finasteride (PROSCAR) 5 MG tablet TAKE 1 TABLET(5 MG) BY MOUTH DAILY 90 tablet 0  . furosemide (LASIX) 20 MG tablet Take 2 tablets (40 mg total) by mouth daily. 30 tablet 1  . lisinopril (PRINIVIL,ZESTRIL) 20 MG tablet TAKE 1 TABLET(20 MG) BY MOUTH DAILY 90 tablet 3  . lovastatin (MEVACOR) 20 MG tablet TAKE 1 TABLET(20 MG) BY MOUTH AT BEDTIME 90 tablet 3  . metoprolol tartrate (LOPRESSOR) 50 MG tablet TAKE 1 TABLET(50 MG) BY MOUTH TWICE DAILY 180 tablet 2  . potassium chloride SA (K-DUR,KLOR-CON) 20 MEQ tablet Take 1 tablet (20 mEq total) by mouth daily. 30 tablet 0  . warfarin (COUMADIN) 5 MG tablet TAKE 1 TABLET BY MOUTH AS DIRECTED BY THE COUMADIN CLINIC 90 tablet 0   No current facility-administered medications on file prior to visit.    There are no Patient Instructions on file for this visit. No follow-ups on file.  Evertt Chouinard A Reace Breshears, PA-C

## 2017-10-29 ENCOUNTER — Other Ambulatory Visit: Payer: Self-pay | Admitting: Urology

## 2017-10-29 ENCOUNTER — Other Ambulatory Visit: Payer: Self-pay | Admitting: Cardiovascular Disease

## 2017-10-29 DIAGNOSIS — N401 Enlarged prostate with lower urinary tract symptoms: Principal | ICD-10-CM

## 2017-10-29 DIAGNOSIS — N138 Other obstructive and reflux uropathy: Secondary | ICD-10-CM

## 2017-10-29 NOTE — Telephone Encounter (Signed)
Refill Request.  

## 2017-10-30 ENCOUNTER — Ambulatory Visit (INDEPENDENT_AMBULATORY_CARE_PROVIDER_SITE_OTHER): Payer: Medicare Other | Admitting: Internal Medicine

## 2017-10-30 VITALS — BP 124/68 | HR 60 | Temp 98.1°F | Resp 18 | Wt 291.0 lb

## 2017-10-30 DIAGNOSIS — E7800 Pure hypercholesterolemia, unspecified: Secondary | ICD-10-CM

## 2017-10-30 DIAGNOSIS — I10 Essential (primary) hypertension: Secondary | ICD-10-CM | POA: Diagnosis not present

## 2017-10-30 DIAGNOSIS — I872 Venous insufficiency (chronic) (peripheral): Secondary | ICD-10-CM

## 2017-10-30 DIAGNOSIS — I272 Pulmonary hypertension, unspecified: Secondary | ICD-10-CM

## 2017-10-30 DIAGNOSIS — I482 Chronic atrial fibrillation, unspecified: Secondary | ICD-10-CM

## 2017-10-30 DIAGNOSIS — R7989 Other specified abnormal findings of blood chemistry: Secondary | ICD-10-CM

## 2017-10-30 DIAGNOSIS — G4733 Obstructive sleep apnea (adult) (pediatric): Secondary | ICD-10-CM

## 2017-10-30 DIAGNOSIS — L03115 Cellulitis of right lower limb: Secondary | ICD-10-CM | POA: Diagnosis not present

## 2017-10-30 DIAGNOSIS — R945 Abnormal results of liver function studies: Secondary | ICD-10-CM

## 2017-10-30 DIAGNOSIS — D72829 Elevated white blood cell count, unspecified: Secondary | ICD-10-CM | POA: Diagnosis not present

## 2017-10-30 NOTE — Progress Notes (Signed)
Patient ID: Michael Blevins, male   DOB: March 06, 1951, 67 y.o.   MRN: [id number]   Subjective:    Patient ID: Michael Blevins, male    DOB: [birth year], 67 y.o.   MRN: [id number]  HPI  Patient here for a scheduled follow up.  Was recently admitted 10/12/17 and diagnosed with cellulitis right lower extremity.  See last note for details.  Completed abx.  Let is better.  Residual stasis changes.  Saw vascular surgery.  W/up in progress for lower extremity swelling.  Overall he feels he is doing well.  No chest pain.  No sob.  No acid reflux.  No abdominal pain.  Bowels moving.  No urine change.  Eating.  No diarrhea.     Past Medical History:  Diagnosis Date  . Allergy    cats and pollen  . Asthma   . Atrial fibrillation ([place])   . Back pain   . Cellulitis   . Gout   . History of chicken pox   . Hyperlipidemia   . Hypertension   . Kidney stone   . Sleep apnea   . Venous insufficiency    Past Surgical History:  Procedure Laterality Date  . APPENDECTOMY    . CARDIAC CATHETERIZATION     Family History  Problem Relation Age of Onset  . Hypertension Mother   . Diabetes Father   . Kidney disease Neg Hx   . Prostate cancer Neg Hx    Social History   Socioeconomic History  . Marital status: Married    Spouse name: Not on file  . Number of children: Not on file  . Years of education: Not on file  . Highest education level: Not on file  Occupational History  . Occupation: retired  Scientific laboratory technician  . Financial resource strain: Not on file  . Food insecurity:    Worry: Not on file    Inability: Not on file  . Transportation needs:    Medical: Not on file    Non-medical: Not on file  Tobacco Use  . Smoking status: Former Research scientist (life sciences)  . Smokeless tobacco: Never Used  . Tobacco comment: quit 33 years  Substance and Sexual Activity  . Alcohol use: No    Alcohol/week: 0.0 oz  . Drug use: No  . Sexual activity: Not on file  Lifestyle  . Physical activity:    Days per week: Not on  file    Minutes per session: Not on file  . Stress: Not on file  Relationships  . Social connections:    Talks on phone: Not on file    Gets together: Not on file    Attends religious service: Not on file    Active member of club or organization: Not on file    Attends meetings of clubs or organizations: Not on file    Relationship status: Not on file  Other Topics Concern  . Not on file  Social History Narrative  . Not on file    Outpatient Encounter Medications as of 10/30/2017  Medication Sig  . amLODipine (NORVASC) 10 MG tablet TAKE 1 TABLET(10 MG) BY MOUTH DAILY  . DIGOX 125 MCG tablet TAKE 1 TABLET(0.125 MG) BY MOUTH DAILY  . finasteride (PROSCAR) 5 MG tablet TAKE 1 TABLET(5 MG) BY MOUTH DAILY  . furosemide (LASIX) 20 MG tablet Take 2 tablets (40 mg total) by mouth daily.  Marland Kitchen lisinopril (PRINIVIL,ZESTRIL) 20 MG tablet TAKE 1 TABLET(20 MG) BY MOUTH DAILY  . lovastatin (MEVACOR)  20 MG tablet TAKE 1 TABLET(20 MG) BY MOUTH AT BEDTIME  . metoprolol tartrate (LOPRESSOR) 50 MG tablet TAKE 1 TABLET(50 MG) BY MOUTH TWICE DAILY  . potassium chloride SA (K-DUR,KLOR-CON) 20 MEQ tablet Take 1 tablet (20 mEq total) by mouth daily.  Marland Kitchen warfarin (COUMADIN) 5 MG tablet TAKE 1 TABLET BY MOUTH AS DIRECTED BY THE COUMADIN CLINIC  . [DISCONTINUED] furosemide (LASIX) 20 MG tablet TAKE 1 TABLET(20 MG) BY MOUTH TWICE DAILY AS NEEDED   No facility-administered encounter medications on file as of 10/30/2017.     Review of Systems  Constitutional: Negative for appetite change and unexpected weight change.  HENT: Negative for congestion and sinus pressure.   Respiratory: Negative for cough, chest tightness and shortness of breath.   Cardiovascular: Negative for chest pain and palpitations.       Stable lower extremity swelling.  No increase.    Gastrointestinal: Negative for abdominal pain, diarrhea, nausea and vomiting.       Probiotics helping bowels.    Genitourinary: Negative for difficulty  urinating and dysuria.  Musculoskeletal: Negative for joint swelling and myalgias.  Skin: Negative for color change and rash.  Neurological: Negative for dizziness, light-headedness and headaches.  Psychiatric/Behavioral: Negative for agitation and dysphoric mood.       Objective:    Physical Exam  Constitutional: He appears well-developed and well-nourished. No distress.  HENT:  Nose: Nose normal.  Mouth/Throat: Oropharynx is clear and moist.  Neck: Neck supple. No thyromegaly present.  Cardiovascular: Normal rate and regular rhythm.  Pulmonary/Chest: Effort normal and breath sounds normal. No respiratory distress.  Abdominal: Soft. Bowel sounds are normal. There is no tenderness.  Musculoskeletal: He exhibits no edema or tenderness.  Lymphadenopathy:    He has no cervical adenopathy.  Skin: No rash noted. No erythema.  Psychiatric: He has a normal mood and affect. His behavior is normal.    BP 124/68 (BP Location: Left Arm, Patient Position: Sitting, Cuff Size: Large)   Pulse 60   Temp 98.1 F (36.7 C) (Oral)   Resp 18   Wt 291 lb (132 kg)   SpO2 96%   BMI 38.39 kg/m  Wt Readings from Last 3 Encounters:  10/30/17 291 lb (132 kg)  10/28/17 289 lb (131.1 kg)  10/17/17 295 lb 3.2 oz (133.9 kg)     Lab Results  Component Value Date   WBC 10.6 (H) 10/30/2017   HGB 16.5 10/30/2017   HCT 48.8 10/30/2017   PLT 257.0 10/30/2017   GLUCOSE 76 10/17/2017   CHOL 132 08/05/2017   TRIG 143.0 08/05/2017   HDL 33.20 (L) 08/05/2017   LDLCALC 70 08/05/2017   ALT 35 10/30/2017   AST 32 10/30/2017   NA 141 10/17/2017   K 3.9 10/17/2017   CL 102 10/17/2017   CREATININE 0.92 10/17/2017   BUN 11 10/17/2017   CO2 31 10/17/2017   TSH 2.41 01/21/2017   PSA 3.49 11/08/2015   INR 2.0 10/27/2017   HGBA1C 5.5 08/05/2017    US Venous Img Lower Unilateral Right  Result Date: 10/12/2017 CLINICAL DATA:  Right lower extremity pain, edema and cellulitis. History of prior great  saphenous vein stripping. EXAM: RIGHT LOWER EXTREMITY VENOUS DOPPLER ULTRASOUND TECHNIQUE: Gray-scale sonography with graded compression, as well as color Doppler and duplex ultrasound were performed to evaluate the lower extremity deep venous systems from the level of the common femoral vein and including the common femoral, femoral, profunda femoral, popliteal and calf veins including the posterior tibial,  peroneal and gastrocnemius veins when visible. The superficial great saphenous vein was also interrogated. Spectral Doppler was utilized to evaluate flow at rest and with distal augmentation maneuvers in the common femoral, femoral and popliteal veins. COMPARISON:  None. FINDINGS: Contralateral Common Femoral Vein: Respiratory phasicity is normal and symmetric with the symptomatic side. No evidence of thrombus. Normal compressibility. Common Femoral Vein: No evidence of thrombus. Normal compressibility, respiratory phasicity and response to augmentation. Saphenofemoral Junction: No evidence of thrombus. Normal compressibility and flow on color Doppler imaging. Profunda Femoral Vein: No evidence of thrombus. Normal compressibility and flow on color Doppler imaging. Femoral Vein: No evidence of thrombus. Normal compressibility, respiratory phasicity and response to augmentation. Popliteal Vein: No evidence of thrombus. Normal compressibility, respiratory phasicity and response to augmentation. Calf Veins: No evidence of thrombus. Normal compressibility and flow on color Doppler imaging. Superficial Great Saphenous Vein: Not visualized. Venous Reflux:  None. Other Findings: No evidence of superficial thrombophlebitis or abnormal fluid collection. IMPRESSION: No evidence of right lower extremity deep venous thrombosis. Electronically Signed   By: Aletta Edouard M.D.   On: 10/12/2017 09:38       Assessment & Plan:   Problem List Items Addressed This Visit    Atrial fibrillation ([place])    On coumadin.   Followed by cardiology.        Cellulitis    No evidence of infection today.  Stasis changes.  Seeing vascular surgery.  W/up in progress.  Follow.       Essential hypertension    Blood pressure under good control.  Continue same medication regimen.  Follow pressures.  Follow metabolic panel.        Hypercholesterolemia    On lovastatin.  Low cholesterol diet and exercise.  Follow lipid panel and liver function tests.        Obstructive sleep apnea syndrome    CPAP.       Pulmonary hypertension ([place])    Evaluated by cardiology.  Currently doing well.  Follow.       Venous insufficiency    Seeing vascular surgery.  W/up in progress.         Other Visit Diagnoses    Leukocytosis, unspecified type    -  Primary   Relevant Orders   CBC with Differential/Platelet (Completed)   Abnormal liver function test       Relevant Orders   Hepatic function panel (Completed)       Einar Pheasant, MD

## 2017-10-31 LAB — CBC WITH DIFFERENTIAL/PLATELET
BASOS PCT: 1.1 % (ref 0.0–3.0)
Basophils Absolute: 0.1 10*3/uL (ref 0.0–0.1)
EOS ABS: 0.3 10*3/uL (ref 0.0–0.7)
EOS PCT: 2.4 % (ref 0.0–5.0)
HCT: 48.8 % (ref 39.0–52.0)
HEMOGLOBIN: 16.5 g/dL (ref 13.0–17.0)
LYMPHS ABS: 2.7 10*3/uL (ref 0.7–4.0)
Lymphocytes Relative: 25.4 % (ref 12.0–46.0)
MCHC: 33.8 g/dL (ref 30.0–36.0)
MCV: 89.7 fl (ref 78.0–100.0)
MONO ABS: 1.1 10*3/uL — AB (ref 0.1–1.0)
Monocytes Relative: 10.1 % (ref 3.0–12.0)
NEUTROS ABS: 6.5 10*3/uL (ref 1.4–7.7)
Neutrophils Relative %: 61 % (ref 43.0–77.0)
PLATELETS: 257 10*3/uL (ref 150.0–400.0)
RBC: 5.44 Mil/uL (ref 4.22–5.81)
RDW: 13.8 % (ref 11.5–15.5)
WBC: 10.6 10*3/uL — ABNORMAL HIGH (ref 4.0–10.5)

## 2017-10-31 LAB — HEPATIC FUNCTION PANEL
ALK PHOS: 57 U/L (ref 39–117)
ALT: 35 U/L (ref 0–53)
AST: 32 U/L (ref 0–37)
Albumin: 4.2 g/dL (ref 3.5–5.2)
Bilirubin, Direct: 0.2 mg/dL (ref 0.0–0.3)
Total Bilirubin: 0.8 mg/dL (ref 0.2–1.2)
Total Protein: 7.3 g/dL (ref 6.0–8.3)

## 2017-11-02 ENCOUNTER — Encounter: Payer: Self-pay | Admitting: Internal Medicine

## 2017-11-02 DIAGNOSIS — I272 Pulmonary hypertension, unspecified: Secondary | ICD-10-CM | POA: Insufficient documentation

## 2017-11-02 NOTE — Assessment & Plan Note (Signed)
No evidence of infection today.  Stasis changes.  Seeing vascular surgery.  W/up in progress.  Follow.

## 2017-11-02 NOTE — Assessment & Plan Note (Signed)
Seeing vascular surgery.  W/up in progress.

## 2017-11-02 NOTE — Assessment & Plan Note (Signed)
Blood pressure under good control.  Continue same medication regimen.  Follow pressures.  Follow metabolic panel.   

## 2017-11-02 NOTE — Assessment & Plan Note (Signed)
Evaluated by cardiology.  Currently doing well.  Follow.

## 2017-11-02 NOTE — Assessment & Plan Note (Signed)
CPAP.  

## 2017-11-02 NOTE — Assessment & Plan Note (Signed)
On coumadin.  Followed by cardiology.  

## 2017-11-02 NOTE — Assessment & Plan Note (Signed)
On lovastatin.  Low cholesterol diet and exercise.  Follow lipid panel and liver function tests.   

## 2017-11-04 ENCOUNTER — Encounter: Payer: Self-pay | Admitting: Internal Medicine

## 2017-11-10 ENCOUNTER — Encounter: Payer: Self-pay | Admitting: Urology

## 2017-11-11 ENCOUNTER — Telehealth: Payer: Self-pay | Admitting: Family Medicine

## 2017-11-11 ENCOUNTER — Other Ambulatory Visit: Payer: Self-pay

## 2017-11-11 DIAGNOSIS — N401 Enlarged prostate with lower urinary tract symptoms: Principal | ICD-10-CM

## 2017-11-11 DIAGNOSIS — N138 Other obstructive and reflux uropathy: Secondary | ICD-10-CM

## 2017-11-11 MED ORDER — FINASTERIDE 5 MG PO TABS: ORAL_TABLET | ORAL | 0 refills | Status: DC

## 2017-11-11 MED ORDER — FINASTERIDE 5 MG PO TABS: ORAL_TABLET | ORAL | 1 refills | Status: DC

## 2017-11-11 NOTE — Telephone Encounter (Signed)
Medication refilled

## 2017-11-28 ENCOUNTER — Ambulatory Visit (INDEPENDENT_AMBULATORY_CARE_PROVIDER_SITE_OTHER): Payer: Medicare Other

## 2017-11-28 ENCOUNTER — Encounter

## 2017-11-28 ENCOUNTER — Encounter (INDEPENDENT_AMBULATORY_CARE_PROVIDER_SITE_OTHER): Payer: Self-pay | Admitting: Vascular Surgery

## 2017-11-28 ENCOUNTER — Other Ambulatory Visit (INDEPENDENT_AMBULATORY_CARE_PROVIDER_SITE_OTHER): Payer: Self-pay | Admitting: Vascular Surgery

## 2017-11-28 ENCOUNTER — Ambulatory Visit (INDEPENDENT_AMBULATORY_CARE_PROVIDER_SITE_OTHER): Payer: Medicare Other | Admitting: Vascular Surgery

## 2017-11-28 VITALS — BP 154/96 | HR 63 | Resp 17 | Ht 73.0 in | Wt 291.0 lb

## 2017-11-28 DIAGNOSIS — L03115 Cellulitis of right lower limb: Secondary | ICD-10-CM

## 2017-11-28 DIAGNOSIS — I890 Lymphedema, not elsewhere classified: Secondary | ICD-10-CM

## 2017-11-28 DIAGNOSIS — R0989 Other specified symptoms and signs involving the circulatory and respiratory systems: Secondary | ICD-10-CM

## 2017-11-28 DIAGNOSIS — I872 Venous insufficiency (chronic) (peripheral): Secondary | ICD-10-CM

## 2017-11-28 NOTE — Progress Notes (Signed)
Subjective:    Patient ID: Michael Blevins, male    DOB: [birth year], 67 y.o.   MRN: [id number] Chief Complaint  Patient presents with  . Follow-up    pt conv abi,bil ven reflux   Patient presents to review vascular studies.  The patient was originally seen on Oct 28, 2017 for evaluation of bilateral lower extremity pain and edema.  Since our initial visit, the patient has started engaging in conservative therapy including wearing medical grade one, mainly active.  The patient has noticed an improvement in his lower extremity edema and discomfort.  The patient is pleased with the results.  The patient underwent a bilateral ABI which was notable for right: Triphasic tibials.  Left: Triphasic tibials.  There is no evidence of significant bilateral lower extremity arterial disease.  Resting bilateral ankle brachial indices are within normal range.  The patient underwent a bilateral lower extremity venous duplex which was notable for an ablated right mid thigh to knee great saphenous vein and ablated left distal thigh to the knee ablated great saphenous vein.  Venous reflux was noted in the left proximal thigh great saphenous vein and bilateral small saphenous veins.  Venous reflux was noted in the bilateral femoral popliteal venous systems.  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  And a chronic thrombus noted in the proximal knee location of the small saphenous vein.  The patient denies any fever, nausea or vomiting.   Review of Systems  Constitutional: Negative.   HENT: Negative.   Eyes: Negative.   Respiratory: Negative.   Cardiovascular: Positive for leg swelling.  Gastrointestinal: Negative.   Endocrine: Negative.   Genitourinary: Negative.   Musculoskeletal: Negative.   Skin: Negative.   Allergic/Immunologic: Negative.   Neurological: Negative.   Hematological: Negative.   Psychiatric/Behavioral: Negative.         Objective:   Physical Exam  Constitutional: He is oriented to person, place, and time. He appears well-developed and well-nourished. No distress.  HENT:  Head: Normocephalic and atraumatic.  Right Ear: External ear normal.  Left Ear: External ear normal.  Eyes: Pupils are equal, round, and reactive to light. Conjunctivae and EOM are normal.  Neck: Normal range of motion.  Cardiovascular: Normal rate, regular rhythm and normal heart sounds.  Pulmonary/Chest: Effort normal and breath sounds normal.  Musculoskeletal: Normal range of motion. He exhibits edema (Mild bilateral lower extremity edema).  Neurological: He is alert and oriented to person, place, and time.  Skin: Skin is warm and dry. He is not diaphoretic.  Psychiatric: He has a normal mood and affect. His behavior is normal. Judgment and thought content normal.  Vitals reviewed.  BP (!) 154/96 (BP Location: Right Arm)   Pulse 63   Resp 17   Ht 6\' 1"  (1.854 m)   Wt 291 lb (132 kg)   BMI 38.39 kg/m   Past Medical History:  Diagnosis Date  . Allergy    cats and pollen  . Asthma   . Atrial fibrillation ([place])   . Back pain   . Cellulitis   . Gout   . History of chicken pox   . Hyperlipidemia   . Hypertension   . Kidney stone   . Sleep apnea   . Venous insufficiency    Social History   Socioeconomic History  . Marital status: Married    Spouse name: Not on file  . Number of children: Not on  file  . Years of education: Not on file  . Highest education level: Not on file  Occupational History  . Occupation: retired  Scientific laboratory technician  . Financial resource strain: Not on file  . Food insecurity:    Worry: Not on file    Inability: Not on file  . Transportation needs:    Medical: Not on file    Non-medical: Not on file  Tobacco Use  . Smoking status: Former Research scientist (life sciences)  . Smokeless tobacco: Never Used  . Tobacco comment: quit 33 years  Substance and Sexual Activity  . Alcohol use: No    Alcohol/week: 0.0 oz  .  Drug use: No  . Sexual activity: Not on file  Lifestyle  . Physical activity:    Days per week: Not on file    Minutes per session: Not on file  . Stress: Not on file  Relationships  . Social connections:    Talks on phone: Not on file    Gets together: Not on file    Attends religious service: Not on file    Active member of club or organization: Not on file    Attends meetings of clubs or organizations: Not on file    Relationship status: Not on file  . Intimate partner violence:    Fear of current or ex partner: Not on file    Emotionally abused: Not on file    Physically abused: Not on file    Forced sexual activity: Not on file  Other Topics Concern  . Not on file  Social History Narrative  . Not on file   Past Surgical History:  Procedure Laterality Date  . APPENDECTOMY    . CARDIAC CATHETERIZATION     Family History  Problem Relation Age of Onset  . Hypertension Mother   . Diabetes Father   . Kidney disease Neg Hx   . Prostate cancer Neg Hx    No Known Allergies     Assessment & Plan:  Patient presents to review vascular studies.  The patient was originally seen on Oct 28, 2017 for evaluation of bilateral lower extremity pain and edema.  Since our initial visit, the patient has started engaging in conservative therapy including wearing medical grade one, mainly active.  The patient has noticed an improvement in his lower extremity edema and discomfort.  The patient is pleased with the results.  The patient underwent a bilateral ABI which was notable for right: Triphasic tibials.  Left: Triphasic tibials.  There is no evidence of significant bilateral lower extremity arterial disease.  Resting bilateral ankle brachial indices are within normal range.  The patient underwent a bilateral lower extremity venous duplex which was notable for an ablated right mid thigh to knee great saphenous vein and ablated left distal thigh to the knee ablated great saphenous vein.  Venous  reflux was noted in the left proximal thigh great saphenous vein and bilateral small saphenous veins.  Venous reflux was noted in the bilateral femoral popliteal venous systems.  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  And a chronic thrombus noted in the proximal knee location of the small saphenous vein.  The patient denies any fever, nausea or vomiting.   1. Venous insufficiency - Stable Patient with venous insufficiency noted to the superficial and deep venous systems. Previously ablated great saphenous areas remain ablated. At this time, the patient is not interested in moving forward with  any additional endovenous laser ablation as he feels that conservative therapy is currently controlling his symptoms. The patient understands that if in the future conservative therapy starts to fail this would be an option to the venous insufficiency in the great saphenous/small saphenous veins. Patient would like to follow-up as needed.  2. Lymphedema - New At this time, the patient's lower extremity edema and discomfort are being controlled to the use of conservative therapy The patient understands that if conservative therapy fails in the future that he may be a candidate for lymphedema pump The patient would like to follow-up PRN  Current Outpatient Medications on File Prior to Visit  Medication Sig Dispense Refill  . amLODipine (NORVASC) 10 MG tablet TAKE 1 TABLET(10 MG) BY MOUTH DAILY 90 tablet 2  . DIGOX 125 MCG tablet TAKE 1 TABLET(0.125 MG) BY MOUTH DAILY 90 tablet 2  . finasteride (PROSCAR) 5 MG tablet TAKE 1 TABLET(5 MG) BY MOUTH DAILY 90 tablet 1  . lisinopril (PRINIVIL,ZESTRIL) 20 MG tablet TAKE 1 TABLET(20 MG) BY MOUTH DAILY 90 tablet 3  . lovastatin (MEVACOR) 20 MG tablet TAKE 1 TABLET(20 MG) BY MOUTH AT BEDTIME 90 tablet 3  . metoprolol tartrate (LOPRESSOR) 50 MG tablet TAKE 1 TABLET(50 MG) BY MOUTH TWICE DAILY 180 tablet 2    . warfarin (COUMADIN) 5 MG tablet TAKE 1 TABLET BY MOUTH AS DIRECTED BY THE COUMADIN CLINIC 90 tablet 0  . furosemide (LASIX) 20 MG tablet Take 2 tablets (40 mg total) by mouth daily. 30 tablet 1  . potassium chloride SA (K-DUR,KLOR-CON) 20 MEQ tablet Take 1 tablet (20 mEq total) by mouth daily. 30 tablet 0   No current facility-administered medications on file prior to visit.    There are no Patient Instructions on file for this visit. No follow-ups on file.  Shelia Magallon A Osinachi Navarrette, PA-C

## 2017-12-08 ENCOUNTER — Ambulatory Visit (INDEPENDENT_AMBULATORY_CARE_PROVIDER_SITE_OTHER): Payer: Medicare Other

## 2017-12-08 DIAGNOSIS — I482 Chronic atrial fibrillation, unspecified: Secondary | ICD-10-CM

## 2017-12-08 DIAGNOSIS — Z5181 Encounter for therapeutic drug level monitoring: Secondary | ICD-10-CM | POA: Diagnosis not present

## 2017-12-08 LAB — POCT INR: INR: 1.8 — AB (ref 2.0–3.0)

## 2017-12-08 NOTE — Patient Instructions (Signed)
Please take 7 mg today, then resume on same dosage 5mg  daily except 7mg  on Fridays.   Please be consistent with your greens. Recheck in 6 weeks.

## 2018-01-01 ENCOUNTER — Ambulatory Visit: Payer: Medicare Other

## 2018-01-04 NOTE — Progress Notes (Signed)
Cardiology Office Note  Date:  01/06/2018   ID:  Michael Blevins, DOB 01/06/1951, MRN [id number]  PCP:  Einar Pheasant, MD   Chief Complaint  Patient presents with  . other    12 month follow up. Meds reviewed by the pt. verbally. "doing well."     HPI:  Michael Blevins is a 67 year old gentleman with history of  Remote history of smoking for 17 years Obesity, Paroxysmal atrial fibrillation,  cardioversion in 2011, did not hold, on chronic anticoagulation ,  sleep apnea on CPAP,   Essential hypertension,  Hyperlipidemia,  Small atrial septal defect ( QP/QS 1.08 ),  Venous insufficiency,  prior cardiac catheterization in 2011 by report showing no significant coronary disease,  who presents for follow-up of his paroxysmal atrial fibrillation, ASD, hyperlipidemia  Cellulitis in 10/2017 after sitting in a car driving to [place] Right greater than left Skin break down Now wearing compression hose on a regular basis  Denies any significant chest pain or shortness of breath on exertion Planning more travel down to [place] for 2 months in the winter Continues to take Lasix twice a day Recent lab work reviewed with him showing stable renal function Lipid panel reviewed   tolerating warfarin, does not want to change to one of the other medications We did discuss the NOACs and data suggesting less stroke and bleeding risk  EKG personally reviewed by myself on todays visit Shows Atrial fibrillation with ventricular rate 61 bpm  Other past medical history reviewed Previously seen at [facility], Records reviewed including stress echocardiogram 06/14/2016 performed for shortness of breath, exercised 6 minutes on a Bruce protocol without chest pain or ECG changes. Echo showed no wall motion abnormality concerning for ischemia  Limited echocardiogram revealed normal left ventricular function, with LV ejection fraction greater than 55%. My review of the study showed details of moderate  TR, mild to moderately elevated right heart pressures, no mention of his left atrial or right atrial size, no mention of atrial septal defect   history of paroxysmal atrial fibrillation, chads Vasc score 4, currently on warfarin, He is interested in moving to our anticoagulation clinic    history of essential hypertension,  He reports that his blood pressure is well controlled on amlodipine, furosemide, lisinopril and metoprolol tartrate.    he has chronic mild lower extremity edema, nonpitting , per the notes felt to be secondary to venous insufficiency   negative cardiac cath 2011  . Sleep apnea    PMH :   has a past medical history of Allergy, Asthma, Atrial fibrillation ([place]), Back pain, Cellulitis, Gout, History of chicken pox, Hyperlipidemia, Hypertension, Kidney stone, Sleep apnea, and Venous insufficiency.  PSH:    Past Surgical History:  Procedure Laterality Date  . APPENDECTOMY    . CARDIAC CATHETERIZATION      Current Outpatient Medications  Medication Sig Dispense Refill  . amLODipine (NORVASC) 10 MG tablet TAKE 1 TABLET(10 MG) BY MOUTH DAILY 90 tablet 2  . DIGOX 125 MCG tablet TAKE 1 TABLET(0.125 MG) BY MOUTH DAILY 90 tablet 2  . finasteride (PROSCAR) 5 MG tablet TAKE 1 TABLET(5 MG) BY MOUTH DAILY 90 tablet 1  . furosemide (LASIX) 20 MG tablet Take 2 tablets (40 mg total) by mouth daily. 30 tablet 1  . lisinopril (PRINIVIL,ZESTRIL) 20 MG tablet TAKE 1 TABLET(20 MG) BY MOUTH DAILY 90 tablet 3  . lovastatin (MEVACOR) 20 MG tablet TAKE 1 TABLET(20 MG) BY MOUTH AT BEDTIME 90 tablet 3  . metoprolol  tartrate (LOPRESSOR) 50 MG tablet TAKE 1 TABLET(50 MG) BY MOUTH TWICE DAILY 180 tablet 2  . potassium chloride SA (K-DUR,KLOR-CON) 20 MEQ tablet Take 1 tablet (20 mEq total) by mouth daily. 30 tablet 0  . warfarin (COUMADIN) 5 MG tablet TAKE 1 TABLET BY MOUTH AS DIRECTED BY THE COUMADIN CLINIC 90 tablet 0   No current facility-administered medications for this visit.       Allergies:   Patient has no known allergies.   Social History:  The patient  reports that he has quit smoking. He has never used smokeless tobacco. He reports that he does not drink alcohol or use drugs.   Family History:   family history includes Diabetes in his father; Hypertension in his mother.    Review of Systems: Review of Systems  Constitutional: Negative.   Respiratory: Negative.   Cardiovascular: Positive for leg swelling.  Gastrointestinal: Negative.   Musculoskeletal: Negative.   Neurological: Negative.   Psychiatric/Behavioral: Negative.   All other systems reviewed and are negative.    PHYSICAL EXAM: VS:  BP 128/80 (BP Location: Left Arm, Patient Position: Sitting, Cuff Size: Large)   Pulse 61   Ht 6\' 1"  (1.854 m)   Wt 291 lb 4 oz (132.1 kg)   BMI 38.43 kg/m  , BMI Body mass index is 38.43 kg/m. Constitutional:  oriented to person, place, and time. No distress. Obese HENT:  Head: Normocephalic and atraumatic.  Eyes:  no discharge. No scleral icterus.  Neck: Normal range of motion. Neck supple. No JVD present.  Cardiovascular: irregularly irregular normal heart sounds and intact distal pulses. Exam reveals no gallop and no friction rub. No edema No murmur heard. Pulmonary/Chest: Effort normal and breath sounds normal. No stridor. No respiratory distress.  no wheezes.  no rales.  no tenderness.  Abdominal: Soft.  no distension.  no tenderness.  Musculoskeletal: Normal range of motion.  no  tenderness or deformity.  Neurological:  normal muscle tone. Coordination normal. No atrophy Skin: Skin is warm and dry. No rash noted. not diaphoretic.  Psychiatric:  normal mood and affect. behavior is normal. Thought content normal.    Recent Labs: 01/21/2017: TSH 2.41 10/17/2017: BUN 11; Creatinine, Ser 0.92; Potassium 3.9; Sodium 141 10/30/2017: ALT 35; Hemoglobin 16.5; Platelets 257.0    Lipid Panel Lab Results  Component Value Date   CHOL 132 08/05/2017    HDL 33.20 (L) 08/05/2017   LDLCALC 70 08/05/2017   TRIG 143.0 08/05/2017      Wt Readings from Last 3 Encounters:  01/06/18 291 lb 4 oz (132.1 kg)  11/28/17 291 lb (132 kg)  10/30/17 291 lb (132 kg)     ASSESSMENT AND PLAN: Chronic atrial fibrillation (HCC) - Plan: EKG 12-Lead Tolerating anticoagulation, rate well controlled CHADS VASC 4 Long discussion with him concerning warfarin in the NOACs  He does not want to change at this time but will let us know  Essential hypertension - Plan: EKG 12-Lead Given his lower extremity edema recommended he stop amlodipine We will increase lisinopril up to 40 mg daily He will monitor blood pressure and call our office if blood pressure runs high  Hypercholesterolemia Cholesterol is at goal on the current lipid regimen. No changes to the medications were made.stable  Venous insufficiency Prior history of vein surgery Recent cellulitis and worsening of his symptoms Was driving long distances and not wearing compression hose And up in the hospital May 2019 with cellulitis  Encounter for anticoagulation discussion and counseling Tolerating warfarin,  does not want to change to other agents such as NOAC  Discussed again with him today  Obstructive sleep apnea On CPAP  Pulmonary hypertension  Previously with elevated right heart pressures seen on echocardiogram outside our office . Pressure likely 45 up to 50 mmHg  Tolerating Lasix 20 twice a day Stable renal function  Non-rheumatic tricuspid valve insufficiency moderate TR per the previous echo, no murmur appreciated on exam today  No further workup  Morbid obesity ([place]) We have encouraged continued exercise, careful diet management in an effort to lose weight.recommended low carbohydrate diet  Atrial septal defect  no  mention of atrial septal defect on limited echocardiogram done at outside office   Total encounter time more than 25 minutes  Greater than 50% was spent in  counseling and coordination of care with the patient   Disposition:   F/U  12 months    Orders Placed This Encounter  Procedures  . EKG 12-Lead     Signed, Esmond Plants, M.D., Ph.D. 01/06/2018  [place], [place]

## 2018-01-06 ENCOUNTER — Encounter: Payer: Self-pay | Admitting: Cardiovascular Disease

## 2018-01-06 ENCOUNTER — Ambulatory Visit (INDEPENDENT_AMBULATORY_CARE_PROVIDER_SITE_OTHER): Payer: Medicare Other | Admitting: Cardiovascular Disease

## 2018-01-06 VITALS — BP 128/80 | HR 61 | Ht 73.0 in | Wt 291.2 lb

## 2018-01-06 DIAGNOSIS — I10 Essential (primary) hypertension: Secondary | ICD-10-CM | POA: Diagnosis not present

## 2018-01-06 DIAGNOSIS — I272 Pulmonary hypertension, unspecified: Secondary | ICD-10-CM

## 2018-01-06 DIAGNOSIS — E7800 Pure hypercholesterolemia, unspecified: Secondary | ICD-10-CM

## 2018-01-06 DIAGNOSIS — Q211 Atrial septal defect, unspecified: Secondary | ICD-10-CM

## 2018-01-06 DIAGNOSIS — I482 Chronic atrial fibrillation, unspecified: Secondary | ICD-10-CM

## 2018-01-06 DIAGNOSIS — G4733 Obstructive sleep apnea (adult) (pediatric): Secondary | ICD-10-CM | POA: Diagnosis not present

## 2018-01-06 MED ORDER — LISINOPRIL 40 MG PO TABS: 40.0000 mg | ORAL_TABLET | Freq: Every day | ORAL | 3 refills | Status: DC

## 2018-01-06 NOTE — Patient Instructions (Signed)
Medication Instructions:   Hold the amlodipine Double the lisinopril 40 daily Monitor blood pressure  Labwork:  No new labs needed  Testing/Procedures:  No further testing at this time   Follow-Up: It was a pleasure seeing you in the office today. Please call us if you have new issues that need to be addressed before your next appt.  [phone]  Your physician wants you to follow-up in: 12 months.  You will receive a reminder letter in the mail two months in advance. If you don't receive a letter, please call our office to schedule the follow-up appointment.  If you need a refill on your cardiac medications before your next appointment, please call your pharmacy.  For educational health videos Log in to : [email or web address] Or : [email or web address], password : [email or web address]

## 2018-01-07 ENCOUNTER — Ambulatory Visit (INDEPENDENT_AMBULATORY_CARE_PROVIDER_SITE_OTHER): Payer: Medicare Other | Admitting: Vascular Surgery

## 2018-01-07 ENCOUNTER — Encounter (INDEPENDENT_AMBULATORY_CARE_PROVIDER_SITE_OTHER): Payer: Medicare Other

## 2018-01-19 ENCOUNTER — Ambulatory Visit (INDEPENDENT_AMBULATORY_CARE_PROVIDER_SITE_OTHER): Payer: Medicare Other

## 2018-01-19 DIAGNOSIS — Z5181 Encounter for therapeutic drug level monitoring: Secondary | ICD-10-CM

## 2018-01-19 DIAGNOSIS — I482 Chronic atrial fibrillation, unspecified: Secondary | ICD-10-CM

## 2018-01-19 LAB — POCT INR: INR: 1.9 — AB (ref 2.0–3.0)

## 2018-01-19 NOTE — Patient Instructions (Signed)
Please take 7 mg today, then START NEW DOSAGE of 5 mg daily except 7 mg on Mondays & Fridays.   Please be consistent with your greens. Recheck in 4 weeks.

## 2018-01-24 ENCOUNTER — Encounter: Payer: Self-pay | Admitting: Internal Medicine

## 2018-01-27 ENCOUNTER — Other Ambulatory Visit: Payer: Self-pay | Admitting: Cardiovascular Disease

## 2018-01-27 ENCOUNTER — Encounter: Payer: Self-pay | Admitting: Internal Medicine

## 2018-01-27 NOTE — Telephone Encounter (Signed)
Refill Request.  

## 2018-01-27 NOTE — Telephone Encounter (Signed)
Please review for refill.  

## 2018-01-28 NOTE — Telephone Encounter (Signed)
Duplicate msg.

## 2018-01-29 ENCOUNTER — Telehealth: Payer: Self-pay

## 2018-01-29 ENCOUNTER — Other Ambulatory Visit: Payer: Self-pay

## 2018-01-29 NOTE — Telephone Encounter (Signed)
Spoke with patient and patients wife this am regarding BP issues. Dr. Rockey Situ recently stopped patient amlodipine and increased his lisinopril to 40 mg q day due to increased swelling in his legs. Patient noticed that he felt like his BP was high after stopping amlodipine. He had his BP checked at local pharmacy and BP was around 170/110 (they did not have exact reading in front of them.) As of yesterday, he has decreased his lisinopril back to 20 mg q day and started back on his amlodipine 10 mg q day. He has noticed that he is not feeling as if his BP is high. He feels better. Patient is requesting appt with you to have his pressure checked and discuss with you. I have advised multiple times that he should follow up with Dr. Rockey Situ regarding this because you are out of the office and Dr. Rockey Situ is the provider following his BP medication. Patient and patients wife have declined f/u with Dr. Rockey Situ until they see you. Wife is scheduled for Monday also and was wanting to give her appt to her husband so he could discuss this matter with you. I have scheduled him an appointment on Monday at 9:00 regarding blood pressures and left his wife on the schedule for her appointment. Advised they should call Dr. Donivan Scull office and let him know what is going on so he can be aware even if they refuse to see him. They stated that they will contact him after visit on Monday. See my chart messages in wife's also. (That is where they originally sent message from)

## 2018-01-30 NOTE — Telephone Encounter (Signed)
Left message letting patient know that he needed to recheck his BP and if symptoms, be evaluated before Monday.

## 2018-01-30 NOTE — Telephone Encounter (Signed)
Noted  

## 2018-01-30 NOTE — Telephone Encounter (Signed)
I am ok to see him.  Agree with notifying Dr Rockey Situ of concerns.  Does need to recheck his blood pressure to confirm not still elevated.  If any problems, will need evaluation before Monday.

## 2018-02-02 ENCOUNTER — Ambulatory Visit (INDEPENDENT_AMBULATORY_CARE_PROVIDER_SITE_OTHER): Payer: Medicare Other | Admitting: Internal Medicine

## 2018-02-02 ENCOUNTER — Encounter: Payer: Self-pay | Admitting: Internal Medicine

## 2018-02-02 DIAGNOSIS — I482 Chronic atrial fibrillation, unspecified: Secondary | ICD-10-CM

## 2018-02-02 DIAGNOSIS — L989 Disorder of the skin and subcutaneous tissue, unspecified: Secondary | ICD-10-CM

## 2018-02-02 DIAGNOSIS — E7800 Pure hypercholesterolemia, unspecified: Secondary | ICD-10-CM

## 2018-02-02 DIAGNOSIS — G4730 Sleep apnea, unspecified: Secondary | ICD-10-CM | POA: Diagnosis not present

## 2018-02-02 DIAGNOSIS — I10 Essential (primary) hypertension: Secondary | ICD-10-CM | POA: Diagnosis not present

## 2018-02-02 DIAGNOSIS — I872 Venous insufficiency (chronic) (peripheral): Secondary | ICD-10-CM

## 2018-02-02 NOTE — Progress Notes (Signed)
Patient ID: Michael Blevins, male   DOB: May 29, 1951, 67 y.o.   MRN: [id number]   Subjective:    Patient ID: Michael Blevins, male    DOB: 1951-02-11, 67 y.o.   MRN: [id number]  HPI  Patient here as a work in to discuss his blood pressure.  He is accompanied by his wife.  History obtained from both of them.  Recently saw cardiology and had his amlodipine stopped and lisinopril increased to 40mg  q day - secondary to persistent lower extremity swelling.  Noticed blood pressure increased after making this change.  Here to discuss.  He has had a history of leg swelling.  Will flare intermittently.  Most recent flare - after long trip.  See previous note.  Saw vascular surgery.  Has venous insufficiency.  Wearing compression hose now.  States swelling recently is better than has been in a long time.  With recent change in medication, noticed increased blood pressure.  Also noticed headache and just did not feel as well.  He went back on his amlodipine several days ago and is back on 20mg  of lisinopril currently. States since going back on his previous regimen, he feels better.  No headache.  No dizziness or light headedness.  No chest pain.  No sob.  Eating.  No nausea or vomiting.  Bowels moving.  Does have a place on left posterior shoulder - itches and is red.  Noticed over the last few days.  Denies tick bite.  No fever.  No other rash.     Past Medical History:  Diagnosis Date  . Allergy    cats and pollen  . Asthma   . Atrial fibrillation ([place])   . Back pain   . Cellulitis   . Gout   . History of chicken pox   . Hyperlipidemia   . Hypertension   . Kidney stone   . Sleep apnea   . Venous insufficiency    Past Surgical History:  Procedure Laterality Date  . APPENDECTOMY    . CARDIAC CATHETERIZATION     Family History  Problem Relation Age of Onset  . Hypertension Mother   . Diabetes Father   . Kidney disease Neg Hx   . Prostate cancer Neg Hx    Social History   Socioeconomic  History  . Marital status: Married    Spouse name: Not on file  . Number of children: Not on file  . Years of education: Not on file  . Highest education level: Not on file  Occupational History  . Occupation: retired  Scientific laboratory technician  . Financial resource strain: Not on file  . Food insecurity:    Worry: Not on file    Inability: Not on file  . Transportation needs:    Medical: Not on file    Non-medical: Not on file  Tobacco Use  . Smoking status: Former Research scientist (life sciences)  . Smokeless tobacco: Never Used  . Tobacco comment: quit 33 years  Substance and Sexual Activity  . Alcohol use: No    Alcohol/week: 0.0 standard drinks  . Drug use: No  . Sexual activity: Not on file  Lifestyle  . Physical activity:    Days per week: Not on file    Minutes per session: Not on file  . Stress: Not on file  Relationships  . Social connections:    Talks on phone: Not on file    Gets together: Not on file    Attends religious service: Not on  file    Active member of club or organization: Not on file    Attends meetings of clubs or organizations: Not on file    Relationship status: Not on file  Other Topics Concern  . Not on file  Social History Narrative  . Not on file    Outpatient Encounter Medications as of 02/02/2018  Medication Sig  . amLODipine (NORVASC) 10 MG tablet Take 1 tablet by mouth daily.  Marland Kitchen DIGOX 125 MCG tablet TAKE 1 TABLET(0.125 MG) BY MOUTH DAILY  . finasteride (PROSCAR) 5 MG tablet TAKE 1 TABLET(5 MG) BY MOUTH DAILY  . furosemide (LASIX) 20 MG tablet Take 2 tablets (40 mg total) by mouth daily.  Marland Kitchen lisinopril (PRINIVIL,ZESTRIL) 40 MG tablet Take 1 tablet (40 mg total) by mouth daily.  Marland Kitchen lovastatin (MEVACOR) 20 MG tablet TAKE 1 TABLET(20 MG) BY MOUTH AT BEDTIME  . metoprolol tartrate (LOPRESSOR) 50 MG tablet TAKE 1 TABLET(50 MG) BY MOUTH TWICE DAILY  . potassium chloride SA (K-DUR,KLOR-CON) 20 MEQ tablet Take 1 tablet (20 mEq total) by mouth daily.  Marland Kitchen warfarin (COUMADIN) 5 MG  tablet TAKE 1 TABLET BY MOUTH AS DIRECTED BY THE COUMADIN CLINIC   No facility-administered encounter medications on file as of 02/02/2018.     Review of Systems  Constitutional: Negative for appetite change and fever.  HENT: Negative for congestion and sinus pressure.   Respiratory: Negative for cough, chest tightness and shortness of breath.   Cardiovascular: Negative for chest pain and palpitations.       Leg swelling - improved.    Gastrointestinal: Negative for abdominal pain, diarrhea, nausea and vomiting.  Genitourinary: Negative for difficulty urinating and dysuria.  Musculoskeletal: Negative for joint swelling and myalgias.  Skin: Negative for color change and rash.  Neurological: Negative for dizziness and light-headedness.       Headache resolved.   Psychiatric/Behavioral: Negative for agitation and dysphoric mood.       Objective:     Blood pressure rechecked by me:  138/78  Physical Exam  Constitutional: He appears well-developed and well-nourished. No distress.  HENT:  Nose: Nose normal.  Mouth/Throat: Oropharynx is clear and moist.  Neck: Neck supple.  Cardiovascular: Normal rate.  Rate controlled.   Pulmonary/Chest: Effort normal and breath sounds normal. No respiratory distress.  Abdominal: Soft. Bowel sounds are normal. There is no tenderness.  Musculoskeletal: He exhibits no tenderness.  Lower extremity swelling improved.  No increased erythema.  Stasis changes noted.   Lymphadenopathy:    He has no cervical adenopathy.  Skin: No erythema.  Stasis changes noted - lower extremities.   Psychiatric: He has a normal mood and affect. His behavior is normal.    BP (!) 142/82 (BP Location: Left Arm, Patient Position: Sitting, Cuff Size: Large)   Pulse (!) 57   Temp 97.6 F (36.4 C) (Oral)   Resp 18   Wt 298 lb 9.6 oz (135.4 kg)   SpO2 97%   BMI 39.40 kg/m  Wt Readings from Last 3 Encounters:  02/02/18 298 lb 9.6 oz (135.4 kg)  01/06/18 291 lb 4 oz  (132.1 kg)  11/28/17 291 lb (132 kg)     Lab Results  Component Value Date   WBC 10.6 (H) 10/30/2017   HGB 16.5 10/30/2017   HCT 48.8 10/30/2017   PLT 257.0 10/30/2017   GLUCOSE 76 10/17/2017   CHOL 132 08/05/2017   TRIG 143.0 08/05/2017   HDL 33.20 (L) 08/05/2017   LDLCALC 70 08/05/2017  ALT 35 10/30/2017   AST 32 10/30/2017   NA 141 10/17/2017   K 3.9 10/17/2017   CL 102 10/17/2017   CREATININE 0.92 10/17/2017   BUN 11 10/17/2017   CO2 31 10/17/2017   TSH 2.41 01/21/2017   PSA 3.49 11/08/2015   INR 1.9 (A) 01/19/2018   HGBA1C 5.5 08/05/2017    US Venous Img Lower Unilateral Right  Result Date: 10/12/2017 CLINICAL DATA:  Right lower extremity pain, edema and cellulitis. History of prior great saphenous vein stripping. EXAM: RIGHT LOWER EXTREMITY VENOUS DOPPLER ULTRASOUND TECHNIQUE: Gray-scale sonography with graded compression, as well as color Doppler and duplex ultrasound were performed to evaluate the lower extremity deep venous systems from the level of the common femoral vein and including the common femoral, femoral, profunda femoral, popliteal and calf veins including the posterior tibial, peroneal and gastrocnemius veins when visible. The superficial great saphenous vein was also interrogated. Spectral Doppler was utilized to evaluate flow at rest and with distal augmentation maneuvers in the common femoral, femoral and popliteal veins. COMPARISON:  None. FINDINGS: Contralateral Common Femoral Vein: Respiratory phasicity is normal and symmetric with the symptomatic side. No evidence of thrombus. Normal compressibility. Common Femoral Vein: No evidence of thrombus. Normal compressibility, respiratory phasicity and response to augmentation. Saphenofemoral Junction: No evidence of thrombus. Normal compressibility and flow on color Doppler imaging. Profunda Femoral Vein: No evidence of thrombus. Normal compressibility and flow on color Doppler imaging. Femoral Vein: No evidence  of thrombus. Normal compressibility, respiratory phasicity and response to augmentation. Popliteal Vein: No evidence of thrombus. Normal compressibility, respiratory phasicity and response to augmentation. Calf Veins: No evidence of thrombus. Normal compressibility and flow on color Doppler imaging. Superficial Great Saphenous Vein: Not visualized. Venous Reflux:  None. Other Findings: No evidence of superficial thrombophlebitis or abnormal fluid collection. IMPRESSION: No evidence of right lower extremity deep venous thrombosis. Electronically Signed   By: Aletta Edouard M.D.   On: 10/12/2017 09:38       Assessment & Plan:   Problem List Items Addressed This Visit    Apnea, sleep    Uses cpap regularly.        Atrial fibrillation ([place])    On coumadin.  Followed by cardiology.  Currently stable.       Essential hypertension    Medication recently adjusted as outlined. Noticed increase with stopping the amlodipine.  Is back on 10mg  amlodipine now.  Also back on 20mg  of lisinopril.  Blood pressure is better.  Feels better. Swelling overall is better - wearing compression hose.  Continue current medication regimen.  Follow pressures.  Follow metabolic panel.        Relevant Orders   CBC with Differential/Platelet   TSH   Basic metabolic panel   Hypercholesterolemia    On lovastatin.  Follow lipid panel and liver function tests.       Relevant Orders   Hepatic function panel   Lipid panel   Skin lesion    Area of redness - posterior shoulder.  Appears to be local reaction to possible bite or sting.  No other rash.  No headache or fever.  No tick exposure.  No known bite or injury.  Apply TCC cream.  Follow.  Call with update and notify me if persistent.        Venous insufficiency    Saw vascular surgery.  Wearing compression hose.  Swelling overall doing better.  Follow.           Jozee Hammer  Nicki Reaper, MD

## 2018-02-04 ENCOUNTER — Encounter: Payer: Self-pay | Admitting: Internal Medicine

## 2018-02-04 NOTE — Assessment & Plan Note (Signed)
Saw vascular surgery.  Wearing compression hose.  Swelling overall doing better.  Follow.

## 2018-02-04 NOTE — Assessment & Plan Note (Signed)
Uses cpap regularly.   

## 2018-02-04 NOTE — Assessment & Plan Note (Signed)
Area of redness - posterior shoulder.  Appears to be local reaction to possible bite or sting.  No other rash.  No headache or fever.  No tick exposure.  No known bite or injury.  Apply TCC cream.  Follow.  Call with update and notify me if persistent.

## 2018-02-04 NOTE — Assessment & Plan Note (Signed)
On coumadin.  Followed by cardiology.  Currently stable.

## 2018-02-04 NOTE — Assessment & Plan Note (Signed)
Medication recently adjusted as outlined. Noticed increase with stopping the amlodipine.  Is back on 10mg  amlodipine now.  Also back on 20mg  of lisinopril.  Blood pressure is better.  Feels better. Swelling overall is better - wearing compression hose.  Continue current medication regimen.  Follow pressures.  Follow metabolic panel.

## 2018-02-04 NOTE — Assessment & Plan Note (Signed)
On lovastatin.  Follow lipid panel and liver function tests.   

## 2018-02-16 ENCOUNTER — Ambulatory Visit (INDEPENDENT_AMBULATORY_CARE_PROVIDER_SITE_OTHER): Payer: Medicare Other

## 2018-02-16 DIAGNOSIS — Z5181 Encounter for therapeutic drug level monitoring: Secondary | ICD-10-CM | POA: Diagnosis not present

## 2018-02-16 DIAGNOSIS — I482 Chronic atrial fibrillation, unspecified: Secondary | ICD-10-CM

## 2018-02-16 LAB — POCT INR: INR: 2.9 (ref 2.0–3.0)

## 2018-02-16 NOTE — Patient Instructions (Signed)
Please continue dosage of 5 mg daily except 7 mg on Mondays & Fridays.   Please be consistent with your greens. Recheck in 5 weeks.

## 2018-03-23 ENCOUNTER — Ambulatory Visit (INDEPENDENT_AMBULATORY_CARE_PROVIDER_SITE_OTHER): Payer: Medicare Other

## 2018-03-23 DIAGNOSIS — Z5181 Encounter for therapeutic drug level monitoring: Secondary | ICD-10-CM | POA: Diagnosis not present

## 2018-03-23 DIAGNOSIS — I482 Chronic atrial fibrillation, unspecified: Secondary | ICD-10-CM | POA: Diagnosis not present

## 2018-03-23 LAB — POCT INR: INR: 2.6 (ref 2.0–3.0)

## 2018-03-23 NOTE — Patient Instructions (Signed)
Please continue dosage of 5 mg daily except 7 mg on Mondays & Fridays.   Please be consistent with your greens. Recheck in 6 weeks.  

## 2018-04-06 ENCOUNTER — Other Ambulatory Visit (INDEPENDENT_AMBULATORY_CARE_PROVIDER_SITE_OTHER): Payer: Medicare Other

## 2018-04-06 DIAGNOSIS — I10 Essential (primary) hypertension: Secondary | ICD-10-CM | POA: Diagnosis not present

## 2018-04-06 DIAGNOSIS — E7800 Pure hypercholesterolemia, unspecified: Secondary | ICD-10-CM | POA: Diagnosis not present

## 2018-04-06 LAB — HEPATIC FUNCTION PANEL
ALBUMIN: 4 g/dL (ref 3.5–5.2)
ALK PHOS: 57 U/L (ref 39–117)
ALT: 26 U/L (ref 0–53)
AST: 27 U/L (ref 0–37)
Bilirubin, Direct: 0.2 mg/dL (ref 0.0–0.3)
TOTAL PROTEIN: 6.9 g/dL (ref 6.0–8.3)
Total Bilirubin: 1 mg/dL (ref 0.2–1.2)

## 2018-04-06 LAB — BASIC METABOLIC PANEL
BUN: 13 mg/dL (ref 6–23)
CO2: 31 mEq/L (ref 19–32)
CREATININE: 1.03 mg/dL (ref 0.40–1.50)
Calcium: 9.3 mg/dL (ref 8.4–10.5)
Chloride: 103 mEq/L (ref 96–112)
GFR: 76.34 mL/min (ref >60.00)
GLUCOSE: 105 mg/dL — AB (ref 70–99)
Potassium: 4.5 mEq/L (ref 3.5–5.1)
Sodium: 140 mEq/L (ref 135–145)

## 2018-04-06 LAB — LDL CHOLESTEROL, DIRECT: Direct LDL: 77 mg/dL

## 2018-04-06 LAB — CBC WITH DIFFERENTIAL/PLATELET
BASOS ABS: 0.1 10*3/uL (ref 0.0–0.1)
Basophils Relative: 0.7 % (ref 0.0–3.0)
Eosinophils Absolute: 0.3 10*3/uL (ref 0.0–0.7)
Eosinophils Relative: 2.9 % (ref 0.0–5.0)
HEMATOCRIT: 48.5 % (ref 39.0–52.0)
Hemoglobin: 16.9 g/dL (ref 13.0–17.0)
LYMPHS ABS: 2.6 10*3/uL (ref 0.7–4.0)
Lymphocytes Relative: 28.4 % (ref 12.0–46.0)
MCHC: 34.8 g/dL (ref 30.0–36.0)
MCV: 88.1 fl (ref 78.0–100.0)
MONOS PCT: 9.1 % (ref 3.0–12.0)
Monocytes Absolute: 0.8 10*3/uL (ref 0.1–1.0)
Neutro Abs: 5.4 10*3/uL (ref 1.4–7.7)
Neutrophils Relative %: 58.9 % (ref 43.0–77.0)
Platelets: 190 10*3/uL (ref 150.0–400.0)
RBC: 5.51 Mil/uL (ref 4.22–5.81)
RDW: 13.4 % (ref 11.5–15.5)
WBC: 9.2 10*3/uL (ref 4.0–10.5)

## 2018-04-06 LAB — LIPID PANEL
Cholesterol: 121 mg/dL (ref 0–200)
HDL: 31.2 mg/dL — ABNORMAL LOW (ref >39.00)
NonHDL: 89.47
Total CHOL/HDL Ratio: 4
Triglycerides: 210 mg/dL — ABNORMAL HIGH (ref 0.0–149.0)
VLDL: 42 mg/dL — ABNORMAL HIGH (ref 0.0–40.0)

## 2018-04-06 LAB — TSH: TSH: 2.94 u[IU]/mL (ref 0.35–4.50)

## 2018-04-07 ENCOUNTER — Encounter: Payer: Self-pay | Admitting: Internal Medicine

## 2018-04-10 ENCOUNTER — Other Ambulatory Visit: Payer: Medicare Other

## 2018-04-13 ENCOUNTER — Encounter: Payer: Self-pay | Admitting: Internal Medicine

## 2018-04-13 ENCOUNTER — Ambulatory Visit (INDEPENDENT_AMBULATORY_CARE_PROVIDER_SITE_OTHER): Payer: Medicare Other | Admitting: Internal Medicine

## 2018-04-13 VITALS — BP 128/78 | HR 55 | Temp 98.0°F | Resp 16 | Ht 73.0 in | Wt 293.2 lb

## 2018-04-13 DIAGNOSIS — I4891 Unspecified atrial fibrillation: Secondary | ICD-10-CM | POA: Diagnosis not present

## 2018-04-13 DIAGNOSIS — Z0000 Encounter for general adult medical examination without abnormal findings: Secondary | ICD-10-CM | POA: Diagnosis not present

## 2018-04-13 DIAGNOSIS — G4730 Sleep apnea, unspecified: Secondary | ICD-10-CM | POA: Diagnosis not present

## 2018-04-13 DIAGNOSIS — E7800 Pure hypercholesterolemia, unspecified: Secondary | ICD-10-CM | POA: Diagnosis not present

## 2018-04-13 DIAGNOSIS — I10 Essential (primary) hypertension: Secondary | ICD-10-CM

## 2018-04-13 DIAGNOSIS — R7301 Impaired fasting glucose: Secondary | ICD-10-CM | POA: Diagnosis not present

## 2018-04-13 DIAGNOSIS — Z125 Encounter for screening for malignant neoplasm of prostate: Secondary | ICD-10-CM

## 2018-04-13 DIAGNOSIS — Z23 Encounter for immunization: Secondary | ICD-10-CM | POA: Diagnosis not present

## 2018-04-13 DIAGNOSIS — R739 Hyperglycemia, unspecified: Secondary | ICD-10-CM

## 2018-04-13 NOTE — Assessment & Plan Note (Addendum)
Physical today 04/13/18.  Followed by urology for prostate checks and psa.  Cologuard - states performed two years ago.  Need results.

## 2018-04-13 NOTE — Progress Notes (Signed)
Patient ID: Michael Blevins, male   DOB: 1951/01/03, 67 y.o.   MRN: [id number]   Subjective:    Patient ID: Michael Blevins, male    DOB: 03/04/1951, 67 y.o.   MRN: [id number]  HPI  Patient with past history of hypercholesterolemia, atrial fib and sleep apnea.  He comes in today to follow up on these issues as well as for a complete physical exam.  He reports he is doing well.  Feels good.  No chest pain.  No increased heart rate or palpitations.  No nausea or vomiting.  Bowels moving.  No abdominal pain.  Discussed labs.     Past Medical History:  Diagnosis Date  . Allergy    cats and pollen  . Asthma   . Atrial fibrillation ([place])   . Back pain   . Cellulitis   . Gout   . History of chicken pox   . Hyperlipidemia   . Hypertension   . Kidney stone   . Sleep apnea   . Venous insufficiency    Past Surgical History:  Procedure Laterality Date  . APPENDECTOMY    . CARDIAC CATHETERIZATION     Family History  Problem Relation Age of Onset  . Hypertension Mother   . Diabetes Father   . Kidney disease Neg Hx   . Prostate cancer Neg Hx    Social History   Socioeconomic History  . Marital status: Married    Spouse name: Not on file  . Number of children: Not on file  . Years of education: Not on file  . Highest education level: Not on file  Occupational History  . Occupation: retired  Scientific laboratory technician  . Financial resource strain: Not on file  . Food insecurity:    Worry: Not on file    Inability: Not on file  . Transportation needs:    Medical: Not on file    Non-medical: Not on file  Tobacco Use  . Smoking status: Former Research scientist (life sciences)  . Smokeless tobacco: Never Used  . Tobacco comment: quit 33 years  Substance and Sexual Activity  . Alcohol use: No    Alcohol/week: 0.0 standard drinks  . Drug use: No  . Sexual activity: Not on file  Lifestyle  . Physical activity:    Days per week: Not on file    Minutes per session: Not on file  . Stress: Not on file    Relationships  . Social connections:    Talks on phone: Not on file    Gets together: Not on file    Attends religious service: Not on file    Active member of club or organization: Not on file    Attends meetings of clubs or organizations: Not on file    Relationship status: Not on file  Other Topics Concern  . Not on file  Social History Narrative  . Not on file    Outpatient Encounter Medications as of 04/13/2018  Medication Sig  . amLODipine (NORVASC) 10 MG tablet Take 1 tablet by mouth daily.  Marland Kitchen DIGOX 125 MCG tablet TAKE 1 TABLET(0.125 MG) BY MOUTH DAILY  . finasteride (PROSCAR) 5 MG tablet TAKE 1 TABLET(5 MG) BY MOUTH DAILY  . furosemide (LASIX) 20 MG tablet Take 2 tablets (40 mg total) by mouth daily.  Marland Kitchen lisinopril (PRINIVIL,ZESTRIL) 40 MG tablet Take 1 tablet (40 mg total) by mouth daily.  Marland Kitchen lovastatin (MEVACOR) 20 MG tablet TAKE 1 TABLET(20 MG) BY MOUTH AT BEDTIME  . metoprolol  tartrate (LOPRESSOR) 50 MG tablet TAKE 1 TABLET(50 MG) BY MOUTH TWICE DAILY  . warfarin (COUMADIN) 5 MG tablet TAKE 1 TABLET BY MOUTH AS DIRECTED BY THE COUMADIN CLINIC  . [DISCONTINUED] potassium chloride SA (K-DUR,KLOR-CON) 20 MEQ tablet Take 1 tablet (20 mEq total) by mouth daily.   No facility-administered encounter medications on file as of 04/13/2018.     Review of Systems  Constitutional: Negative for appetite change and unexpected weight change.  HENT: Negative for congestion and sinus pressure.   Eyes: Negative for pain and visual disturbance.  Respiratory: Negative for cough, chest tightness and shortness of breath.   Cardiovascular: Negative for chest pain, palpitations and leg swelling.  Gastrointestinal: Negative for abdominal pain, diarrhea, nausea and vomiting.  Genitourinary: Negative for difficulty urinating and dysuria.  Musculoskeletal: Negative for joint swelling and myalgias.  Skin: Negative for color change and rash.  Neurological: Negative for dizziness, light-headedness  and headaches.  Hematological: Negative for adenopathy. Does not bruise/bleed easily.  Psychiatric/Behavioral: Negative for agitation and dysphoric mood.       Objective:    Physical Exam  Constitutional: He is oriented to person, place, and time. He appears well-developed and well-nourished. No distress.  HENT:  Head: Normocephalic and atraumatic.  Nose: Nose normal.  Mouth/Throat: Oropharynx is clear and moist. No oropharyngeal exudate.  Eyes: Conjunctivae are normal. Right eye exhibits no discharge. Left eye exhibits no discharge.  Neck: Neck supple. No thyromegaly present.  Cardiovascular: Normal rate and regular rhythm.  Pulmonary/Chest: Breath sounds normal. No respiratory distress. He has no wheezes.  Abdominal: Soft. Bowel sounds are normal. There is no tenderness.  Genitourinary: Rectum normal and penis normal.  Musculoskeletal: He exhibits no edema or tenderness.  Lymphadenopathy:    He has no cervical adenopathy.  Neurological: He is alert and oriented to person, place, and time.  Skin: No rash noted. No erythema.  Psychiatric: He has a normal mood and affect. His behavior is normal.    BP 128/78 (BP Location: Left Arm, Patient Position: Sitting, Cuff Size: Large)   Pulse (!) 55   Temp 98 F (36.7 C) (Oral)   Resp 16   Ht 6\' 1"  (1.854 m)   Wt 293 lb 3.2 oz (133 kg)   SpO2 98%   BMI 38.68 kg/m  Wt Readings from Last 3 Encounters:  04/13/18 293 lb 3.2 oz (133 kg)  02/02/18 298 lb 9.6 oz (135.4 kg)  01/06/18 291 lb 4 oz (132.1 kg)     Lab Results  Component Value Date   WBC 9.2 04/06/2018   HGB 16.9 04/06/2018   HCT 48.5 04/06/2018   PLT 190.0 04/06/2018   GLUCOSE 105 (H) 04/06/2018   CHOL 121 04/06/2018   TRIG 210.0 (H) 04/06/2018   HDL 31.20 (L) 04/06/2018   LDLDIRECT 77.0 04/06/2018   LDLCALC 70 08/05/2017   ALT 26 04/06/2018   AST 27 04/06/2018   NA 140 04/06/2018   K 4.5 04/06/2018   CL 103 04/06/2018   CREATININE 1.03 04/06/2018   BUN 13  04/06/2018   CO2 31 04/06/2018   TSH 2.94 04/06/2018   PSA 3.49 11/08/2015   INR 2.6 03/23/2018   HGBA1C 5.5 08/05/2017    US Venous Img Lower Unilateral Right  Result Date: 10/12/2017 CLINICAL DATA:  Right lower extremity pain, edema and cellulitis. History of prior great saphenous vein stripping. EXAM: RIGHT LOWER EXTREMITY VENOUS DOPPLER ULTRASOUND TECHNIQUE: Gray-scale sonography with graded compression, as well as color Doppler and duplex ultrasound were  performed to evaluate the lower extremity deep venous systems from the level of the common femoral vein and including the common femoral, femoral, profunda femoral, popliteal and calf veins including the posterior tibial, peroneal and gastrocnemius veins when visible. The superficial great saphenous vein was also interrogated. Spectral Doppler was utilized to evaluate flow at rest and with distal augmentation maneuvers in the common femoral, femoral and popliteal veins. COMPARISON:  None. FINDINGS: Contralateral Common Femoral Vein: Respiratory phasicity is normal and symmetric with the symptomatic side. No evidence of thrombus. Normal compressibility. Common Femoral Vein: No evidence of thrombus. Normal compressibility, respiratory phasicity and response to augmentation. Saphenofemoral Junction: No evidence of thrombus. Normal compressibility and flow on color Doppler imaging. Profunda Femoral Vein: No evidence of thrombus. Normal compressibility and flow on color Doppler imaging. Femoral Vein: No evidence of thrombus. Normal compressibility, respiratory phasicity and response to augmentation. Popliteal Vein: No evidence of thrombus. Normal compressibility, respiratory phasicity and response to augmentation. Calf Veins: No evidence of thrombus. Normal compressibility and flow on color Doppler imaging. Superficial Great Saphenous Vein: Not visualized. Venous Reflux:  None. Other Findings: No evidence of superficial thrombophlebitis or abnormal fluid  collection. IMPRESSION: No evidence of right lower extremity deep venous thrombosis. Electronically Signed   By: Aletta Edouard M.D.   On: 10/12/2017 09:38       Assessment & Plan:   Problem List Items Addressed This Visit    Apnea, sleep    Uses cpap regularly.        Atrial fibrillation ([place])    Followed by cardiology.  On coumadin.  Stable.        Elevated fasting blood sugar    Recheck fasting glucose.        Essential hypertension    Blood pressure under good control.  Continue same medication regimen.  Follow pressures.  Follow metabolic panel.        Health care maintenance    Physical today 04/13/18.  Followed by urology for prostate checks and psa.  Cologuard - states performed two years ago.  Need results.        Hypercholesterolemia    On lovastatin.  Low cholesterol diet and exercise.  Follow lipid panel and liver function tests.         Other Visit Diagnoses    Hyperglycemia    -  Primary   Relevant Orders   Glucose, fasting   Hemoglobin A1c   Encounter for immunization       Relevant Orders   Flu vaccine HIGH DOSE PF (Completed)   Prostate cancer screening       Relevant Orders   PSA, Medicare       Einar Pheasant, MD

## 2018-04-18 ENCOUNTER — Encounter: Payer: Self-pay | Admitting: Internal Medicine

## 2018-04-18 NOTE — Assessment & Plan Note (Signed)
Followed by cardiology.  On coumadin.  Stable.

## 2018-04-18 NOTE — Assessment & Plan Note (Signed)
Recheck fasting glucose.

## 2018-04-18 NOTE — Assessment & Plan Note (Signed)
Blood pressure under good control.  Continue same medication regimen.  Follow pressures.  Follow metabolic panel.   

## 2018-04-18 NOTE — Assessment & Plan Note (Signed)
On lovastatin.  Low cholesterol diet and exercise.  Follow lipid panel and liver function tests.   

## 2018-04-18 NOTE — Assessment & Plan Note (Signed)
Uses cpap regularly.   

## 2018-04-20 NOTE — Telephone Encounter (Signed)
-----   Message from Lars Masson, LPN sent at 29/79/8921  5:11 PM EST ----- Regarding: RE: cologuard results I have called him and told him to call cologuard. Also sent my chart message with the phone number so he can give them the ok to release info ----- Message ----- From: Einar Pheasant, MD Sent: 04/18/2018  11:03 PM EST To: Lars Masson, LPN Subject: cologuard results                              Need his cologard results.  This is the patient you said had cologuard at different office. Need results for our records.    Dr Nicki Reaper

## 2018-04-27 ENCOUNTER — Other Ambulatory Visit: Payer: Self-pay | Admitting: Cardiovascular Disease

## 2018-04-27 NOTE — Telephone Encounter (Signed)
Please advise if ok to refill Amlodipine pt told to hold amlodipine last OV. Pt has since been continuing Amlodipine 10 mg tablet daily. Please advise if ok to refill Amlodipine.

## 2018-04-27 NOTE — Telephone Encounter (Signed)
Refill Request Warfarin.

## 2018-04-27 NOTE — Telephone Encounter (Signed)
According to [facility] request pt wanting Amlodipine 10mg  (1 tablet) once daily rx refill.  Per Dr. Rockey Situ last correspondence; "amlodipine is a good blood pressure pill with side effect of leg swelling  Little bit extra lisinopril may not have been enough  Let me know If you want to try different medication instead of amlodipine  Sometimes it takes a little trial of something else  Benefit would be less swelling in the legs and less cellulitis  thx  TGollan   02/08/2018."  E-Rx for Amlodipine 10 mg ( 1 tablet) once per day routed to pharmacy on file.

## 2018-05-04 ENCOUNTER — Ambulatory Visit (INDEPENDENT_AMBULATORY_CARE_PROVIDER_SITE_OTHER): Payer: Medicare Other

## 2018-05-04 DIAGNOSIS — I482 Chronic atrial fibrillation, unspecified: Secondary | ICD-10-CM | POA: Diagnosis not present

## 2018-05-04 DIAGNOSIS — Z5181 Encounter for therapeutic drug level monitoring: Secondary | ICD-10-CM

## 2018-05-04 LAB — POCT INR: INR: 2.4 (ref 2.0–3.0)

## 2018-05-04 NOTE — Patient Instructions (Signed)
Please continue dosage of 5 mg daily except 7 mg on Mondays & Fridays.   Please be consistent with your greens. Recheck in 6 weeks.

## 2018-05-05 ENCOUNTER — Other Ambulatory Visit (INDEPENDENT_AMBULATORY_CARE_PROVIDER_SITE_OTHER): Payer: Medicare Other

## 2018-05-05 DIAGNOSIS — Z125 Encounter for screening for malignant neoplasm of prostate: Secondary | ICD-10-CM

## 2018-05-05 DIAGNOSIS — R739 Hyperglycemia, unspecified: Secondary | ICD-10-CM

## 2018-05-05 LAB — PSA, MEDICARE: PSA: 0.77 ng/mL (ref 0.10–4.00)

## 2018-05-05 LAB — HEMOGLOBIN A1C: HEMOGLOBIN A1C: 5.6 % (ref 4.6–6.5)

## 2018-05-06 ENCOUNTER — Encounter: Payer: Self-pay | Admitting: Internal Medicine

## 2018-05-06 LAB — GLUCOSE, FASTING: Glucose, Plasma: 94 mg/dL (ref 65–99)

## 2018-06-08 DIAGNOSIS — I482 Chronic atrial fibrillation, unspecified: Secondary | ICD-10-CM | POA: Diagnosis not present

## 2018-06-08 DIAGNOSIS — M10071 Idiopathic gout, right ankle and foot: Secondary | ICD-10-CM | POA: Diagnosis not present

## 2018-06-08 DIAGNOSIS — Z7901 Long term (current) use of anticoagulants: Secondary | ICD-10-CM | POA: Diagnosis not present

## 2018-06-15 ENCOUNTER — Encounter: Payer: Self-pay | Admitting: Family Medicine

## 2018-06-15 ENCOUNTER — Ambulatory Visit (INDEPENDENT_AMBULATORY_CARE_PROVIDER_SITE_OTHER): Payer: Medicare Other

## 2018-06-15 ENCOUNTER — Ambulatory Visit (INDEPENDENT_AMBULATORY_CARE_PROVIDER_SITE_OTHER): Payer: Medicare Other | Admitting: Family Medicine

## 2018-06-15 VITALS — BP 136/84 | HR 76 | Temp 98.4°F | Ht 73.0 in | Wt 294.0 lb

## 2018-06-15 DIAGNOSIS — Z5181 Encounter for therapeutic drug level monitoring: Secondary | ICD-10-CM | POA: Diagnosis not present

## 2018-06-15 DIAGNOSIS — M109 Gout, unspecified: Secondary | ICD-10-CM | POA: Diagnosis not present

## 2018-06-15 DIAGNOSIS — I482 Chronic atrial fibrillation, unspecified: Secondary | ICD-10-CM | POA: Diagnosis not present

## 2018-06-15 LAB — POCT INR: INR: 2.6 (ref 2.0–3.0)

## 2018-06-15 MED ORDER — COLCHICINE 0.6 MG PO TABS: ORAL_TABLET | ORAL | 0 refills | Status: DC

## 2018-06-15 MED ORDER — METHYLPREDNISOLONE 4 MG PO TBPK: ORAL_TABLET | ORAL | 0 refills | Status: DC

## 2018-06-15 NOTE — Patient Instructions (Signed)
Please continue dosage of 5 mg daily except 7 mg on Mondays & Fridays.   Please be consistent with your greens. Recheck in 7 weeks after you get back in town.

## 2018-06-15 NOTE — Progress Notes (Signed)
Subjective:    Patient ID: Michael Blevins, male    DOB: May 25, 1951, 68 y.o.   MRN: [id number]  HPI   Patient presents to clinic complaining of gout flareup in his right great toe.  Patient states he gets gout off and on, patient has been dealing with his current gout flareup since 29 December.  He went to [place] [facility] urgent care and was prescribed prednisone course.  Patient states during the prednisone course his gout did seem improved.  However, after stopping the prednisone the pain and redness in right great toe also began to return.  Patient has upcoming trip to [place] where he is driving, and wants to be sure the gout flareup is gone before he drives.  Patient Active Problem List   Diagnosis Date Noted  . Pulmonary hypertension ([place]) 11/02/2017  . Lymphedema 10/28/2017  . Cellulitis 10/12/2017  . Dizziness 08/03/2017  . Impaired fasting glucose 03/17/2017  . Arthritis of left knee 12/03/2016  . Primary osteoarthritis of left knee 11/26/2016  . Encounter for therapeutic drug monitoring 07/17/2016  . Atrial septal defect 07/05/2016  . SOB (shortness of breath) on exertion 05/20/2016  . Atrial septal defect, secundum 05/20/2016  . HLD (hyperlipidemia) 07/11/2015  . BP (high blood pressure) 07/11/2015  . Elevated fasting blood sugar 07/11/2015  . Calculus of kidney 07/11/2015  . Health care maintenance 11/06/2014  . Atrial fibrillation ([place]) 07/25/2014  . Essential hypertension 07/25/2014  . Hypercholesterolemia 07/25/2014  . Nephrolithiasis 07/25/2014  . Obstructive sleep apnea syndrome 07/25/2014  . Venous insufficiency 07/25/2014  . Apnea, sleep 02/11/2014  . H/O cardiac catheterization 02/11/2014  . Blood in the urine 10/15/2011  . Skin lesion 10/15/2011  . Back ache 09/26/2011  . Adiposity 09/26/2011   Social History   Tobacco Use  . Smoking status: Former Research scientist (life sciences)  . Smokeless tobacco: Never Used  . Tobacco comment: quit 33 years  Substance Use Topics  .  Alcohol use: No    Alcohol/week: 0.0 standard drinks   Review of Systems  Constitutional: Negative for chills, fatigue and fever.  HENT: Negative for congestion, ear pain, sinus pain and sore throat.   Eyes: Negative.   Respiratory: Negative for cough, shortness of breath and wheezing.   Cardiovascular: Negative for chest pain, palpitations and leg swelling.  Gastrointestinal: Negative for abdominal pain, diarrhea, nausea and vomiting.  Genitourinary: Negative for dysuria, frequency and urgency.  Musculoskeletal: +pain in right great toe Skin: Negative for color change, pallor and rash.  Neurological: Negative for syncope, light-headedness and headaches.  Psychiatric/Behavioral: The patient is not nervous/anxious.       Objective:   Physical Exam Vitals signs and nursing note reviewed.  Constitutional:      General: He is not in acute distress.    Appearance: He is not toxic-appearing.  HENT:     Head: Normocephalic and atraumatic.  Eyes:     General: No scleral icterus.    Extraocular Movements: Extraocular movements intact.     Conjunctiva/sclera: Conjunctivae normal.  Cardiovascular:     Rate and Rhythm: Normal rate.  Pulmonary:     Effort: Pulmonary effort is normal. No respiratory distress.  Musculoskeletal:        General: Tenderness (right great toe) present.  Skin:    Findings: Erythema (right great toe) present.  Neurological:     Mental Status: He is alert and oriented to person, place, and time.  Psychiatric:        Mood and Affect: Mood normal.  Behavior: Behavior normal.    Vitals:   06/15/18 1408  BP: 136/84  Pulse: 76  Temp: 98.4 F (36.9 C)  SpO2: 95%      Assessment & Plan:   Acute gout of right great toe - patient will take colchicine 2 tablets x 1, then an hour later take 1 tablet to treat acute gout flare.  He will take another steroid taper.  Advised he can also use Tylenol if needed for any pain.  Also advised when he is sitting, he  can elevate foot and put a warm pack over sore area.  Patient will keep regularly scheduled follow-up with PCP as planned.  Advised return to clinic sooner if any issues arise.

## 2018-07-15 ENCOUNTER — Encounter: Payer: Self-pay | Admitting: Internal Medicine

## 2018-07-26 ENCOUNTER — Other Ambulatory Visit: Payer: Self-pay | Admitting: Cardiovascular Disease

## 2018-07-26 ENCOUNTER — Other Ambulatory Visit: Payer: Self-pay | Admitting: Urology

## 2018-07-26 DIAGNOSIS — N138 Other obstructive and reflux uropathy: Secondary | ICD-10-CM

## 2018-07-26 DIAGNOSIS — N401 Enlarged prostate with lower urinary tract symptoms: Principal | ICD-10-CM

## 2018-07-27 NOTE — Telephone Encounter (Signed)
Please review for refill, Thanks !  

## 2018-07-31 ENCOUNTER — Encounter: Payer: Self-pay | Admitting: Internal Medicine

## 2018-07-31 DIAGNOSIS — N138 Other obstructive and reflux uropathy: Secondary | ICD-10-CM

## 2018-07-31 DIAGNOSIS — N401 Enlarged prostate with lower urinary tract symptoms: Principal | ICD-10-CM

## 2018-08-02 MED ORDER — FINASTERIDE 5 MG PO TABS: ORAL_TABLET | ORAL | 1 refills | Status: DC

## 2018-08-02 NOTE — Telephone Encounter (Signed)
rx sent in for finasteride #90 with one refill.

## 2018-08-10 ENCOUNTER — Ambulatory Visit (INDEPENDENT_AMBULATORY_CARE_PROVIDER_SITE_OTHER): Payer: Medicare Other

## 2018-08-10 DIAGNOSIS — I482 Chronic atrial fibrillation, unspecified: Secondary | ICD-10-CM | POA: Diagnosis not present

## 2018-08-10 DIAGNOSIS — Z5181 Encounter for therapeutic drug level monitoring: Secondary | ICD-10-CM

## 2018-08-10 LAB — POCT INR: INR: 3 (ref 2.0–3.0)

## 2018-08-10 NOTE — Patient Instructions (Signed)
Please have a serving of greens today and continue dosage of 5 mg daily except 7 mg on Mondays & Fridays.   Please be consistent with your greens. Recheck in 6 weeks.

## 2018-08-19 ENCOUNTER — Ambulatory Visit: Payer: Medicare Other | Admitting: Internal Medicine

## 2018-08-21 ENCOUNTER — Other Ambulatory Visit: Payer: Self-pay

## 2018-08-24 ENCOUNTER — Other Ambulatory Visit: Payer: Self-pay

## 2018-08-24 MED ORDER — WARFARIN SODIUM 5 MG PO TABS: 5.0000 mg | ORAL_TABLET | ORAL | 0 refills | Status: DC

## 2018-09-18 ENCOUNTER — Telehealth: Payer: Self-pay

## 2018-09-18 NOTE — Telephone Encounter (Signed)

## 2018-09-21 ENCOUNTER — Ambulatory Visit (INDEPENDENT_AMBULATORY_CARE_PROVIDER_SITE_OTHER): Payer: Medicare Other

## 2018-09-21 DIAGNOSIS — I482 Chronic atrial fibrillation, unspecified: Secondary | ICD-10-CM

## 2018-09-21 DIAGNOSIS — I4891 Unspecified atrial fibrillation: Secondary | ICD-10-CM

## 2018-09-21 DIAGNOSIS — Z5181 Encounter for therapeutic drug level monitoring: Secondary | ICD-10-CM | POA: Diagnosis not present

## 2018-09-21 LAB — POCT INR: INR: 2.2 (ref 2.0–3.0)

## 2018-09-21 NOTE — Patient Instructions (Signed)
Please continue dosage of 5 mg daily except 7 mg on Mondays & Fridays.  Recheck in 8 weeks.

## 2018-09-22 ENCOUNTER — Other Ambulatory Visit: Payer: Self-pay

## 2018-09-29 ENCOUNTER — Other Ambulatory Visit: Payer: Self-pay | Admitting: Cardiovascular Disease

## 2018-09-29 MED ORDER — WARFARIN SODIUM 5 MG PO TABS: 5.0000 mg | ORAL_TABLET | ORAL | 0 refills | Status: DC

## 2018-09-29 NOTE — Telephone Encounter (Signed)
°*  STAT* If patient is at the pharmacy, call can be transferred to refill team.   1. Which medications need to be refilled? (please list name of each medication and dose if known)  Coumadin 5 mg po as directed   2. Which pharmacy/location (including street and city if local pharmacy) is medication to be sent to? [facility] [address] [place]   3. Do they need a 30 day or 90 day supply?  [place]

## 2018-09-30 MED ORDER — WARFARIN SODIUM 2 MG PO TABS: ORAL_TABLET | ORAL | 1 refills | Status: DC

## 2018-09-30 NOTE — Addendum Note (Signed)
Addended by: Brynda Peon on: 09/30/2018 09:20 AM   Modules accepted: Orders

## 2018-09-30 NOTE — Telephone Encounter (Signed)
*  STAT* If patient is at the pharmacy, call can be transferred to refill team.   1. Which medications need to be refilled? (please list name of each medication and dose if known) Coumadin 2 MG (not 5) po as directed   2. Which pharmacy/location (including street and city if local pharmacy) is medication to be sent to? [facility] on [address] in [place]   3. Do they need a 30 day or 90 day supply? 90 day

## 2018-09-30 NOTE — Telephone Encounter (Signed)
Please review for refill, Thanks !  

## 2018-10-14 ENCOUNTER — Telehealth: Payer: Self-pay | Admitting: Cardiovascular Disease

## 2018-10-14 MED ORDER — FUROSEMIDE 20 MG PO TABS: ORAL_TABLET | ORAL | 0 refills | Status: DC

## 2018-10-14 NOTE — Telephone Encounter (Signed)
Requested Prescriptions   Signed Prescriptions Disp Refills  . furosemide (LASIX) 20 MG tablet 180 tablet 0    Sig: TAKE 1 TABLET TWICE DAILY AS NEEDED    Authorizing Provider: Minna Merritts    Ordering User: Raelene Bott, Charnelle Bergeman L

## 2018-10-14 NOTE — Telephone Encounter (Signed)
°*  STAT* If patient is at the pharmacy, call can be transferred to refill team.   1. Which medications need to be refilled? (please list name of each medication and dose if known) Furosemide 20 mg po BID as needed   2. Which pharmacy/location (including street and city if local pharmacy) is medication to be sent to? [facility] [address] [place]   3. Do they need a 30 day or 90 day supply? [place]

## 2018-10-16 ENCOUNTER — Other Ambulatory Visit: Payer: Self-pay | Admitting: Cardiovascular Disease

## 2018-10-16 NOTE — Telephone Encounter (Signed)
Please review for refill. LOV 7/19 dosage increased to 40 mg.

## 2018-10-16 NOTE — Telephone Encounter (Signed)
In reviewing the patient's chart, it was recommended by Dr. Rockey Situ at his 01/06/18 office visit: "Given his lower extremity edema recommended he stop amlodipine We will increase lisinopril up to 40 mg daily He will monitor blood pressure and call our office if blood pressure runs high"   The patient sent a MyChart message back on 02/05/19 stating: "This is not a question but it is feedback from my July 30 visit. At that visit you suggested a switch from Amlodipine for a larger dose of Lisinopril (up to 40 mg). Last week my blood pressure continued to rise to approximately 170/110. I have since gone back to my normal dose of 10 mg of Amlodipine and 20 mg of Lisinopril. BP is back to normal"  It looks like he has maintained on lisinopril 20 mg once daily.  If you could please verify with the patient prior to sending in his prescription, that would be great!  Thanks!

## 2018-10-16 NOTE — Telephone Encounter (Signed)
Pt states the Lisinopril 40 mg did not work as expected. He is currently taking lisinopril  20 mg and needs a refill.  A refill for lisinopril 20 mg will be sent to requesting pharmacy.

## 2018-10-28 ENCOUNTER — Encounter: Payer: Self-pay | Admitting: Internal Medicine

## 2018-10-28 ENCOUNTER — Ambulatory Visit (INDEPENDENT_AMBULATORY_CARE_PROVIDER_SITE_OTHER): Payer: Medicare Other | Admitting: Internal Medicine

## 2018-10-28 ENCOUNTER — Other Ambulatory Visit: Payer: Self-pay

## 2018-10-28 DIAGNOSIS — I10 Essential (primary) hypertension: Secondary | ICD-10-CM | POA: Diagnosis not present

## 2018-10-28 DIAGNOSIS — I4891 Unspecified atrial fibrillation: Secondary | ICD-10-CM | POA: Diagnosis not present

## 2018-10-28 DIAGNOSIS — D582 Other hemoglobinopathies: Secondary | ICD-10-CM | POA: Diagnosis not present

## 2018-10-28 DIAGNOSIS — G4730 Sleep apnea, unspecified: Secondary | ICD-10-CM | POA: Diagnosis not present

## 2018-10-28 DIAGNOSIS — E7800 Pure hypercholesterolemia, unspecified: Secondary | ICD-10-CM | POA: Diagnosis not present

## 2018-10-28 DIAGNOSIS — I272 Pulmonary hypertension, unspecified: Secondary | ICD-10-CM | POA: Diagnosis not present

## 2018-10-28 NOTE — Progress Notes (Signed)
Patient ID: Michael Blevins, male   DOB: April 12, 1951, 68 y.o.   MRN: [id number]   Virtual Visit via videoNote  This visit type was conducted due to national recommendations for restrictions regarding the COVID-19 pandemic (e.g. social distancing).  This format is felt to be most appropriate for this patient at this time.  All issues noted in this document were discussed and addressed.  No physical exam was performed (except for noted visual exam findings with Video Visits).   I connected with [name] by a video enabled telemedicine application and verified that I am speaking with the correct person using two identifiers. Location patient: home Location provider: work Persons participating in the virtual visit: patient, provider  I discussed the limitations, risks, security and privacy concerns of performing an evaluation and management service by video and the availability of in person appointments.  The patient expressed understanding and agreed to proceed.   Reason for visit: scheduled follow up.   HPI: Was seen recently for gout. Treated.  Has not had any further episodes.  Trying to stay active.  No chest pain.  No sob.  No acid reflux.  No abdominal pain.  Bowels moving.  Using cpap.  Doing well with this.  No increased heart rate or palpitations.  Has adjusted his diet.  Some right knee issues.  Desires no fruther intervention at this time.  Follow.     ROS: See pertinent positives and negatives per HPI.  Past Medical History:  Diagnosis Date  . Allergy    cats and pollen  . Asthma   . Atrial fibrillation ([place])   . Back pain   . Cellulitis   . Gout   . History of chicken pox   . Hyperlipidemia   . Hypertension   . Kidney stone   . Sleep apnea   . Venous insufficiency     Past Surgical History:  Procedure Laterality Date  . APPENDECTOMY    . CARDIAC CATHETERIZATION      Family History  Problem Relation Age of Onset  . Hypertension Mother   . Diabetes Father    . Kidney disease Neg Hx   . Prostate cancer Neg Hx     SOCIAL HX: reviewed.    Current Outpatient Medications:  .  amLODipine (NORVASC) 10 MG tablet, TAKE 1 TABLET(10 MG) BY MOUTH DAILY, Disp: 90 tablet, Rfl: 2 .  digoxin (LANOXIN) 0.125 MG tablet, TAKE 1 TABLET(0.125 MG) BY MOUTH DAILY, Disp: 90 tablet, Rfl: 2 .  finasteride (PROSCAR) 5 MG tablet, TAKE 1 TABLET(5 MG) BY MOUTH DAILY, Disp: 90 tablet, Rfl: 1 .  furosemide (LASIX) 20 MG tablet, TAKE 1 TABLET TWICE DAILY AS NEEDED, Disp: 180 tablet, Rfl: 0 .  lisinopril (ZESTRIL) 20 MG tablet, TAKE 1 TABLET(20 MG) BY MOUTH DAILY, Disp: 90 tablet, Rfl: 0 .  lovastatin (MEVACOR) 20 MG tablet, TAKE 1 TABLET(20 MG) BY MOUTH AT BEDTIME, Disp: 90 tablet, Rfl: 3 .  methylPREDNISolone (MEDROL DOSEPAK) 4 MG TBPK tablet, Start on 06/16/2018, take according to pack instructions, Disp: 21 tablet, Rfl: 0 .  metoprolol tartrate (LOPRESSOR) 50 MG tablet, TAKE 1 TABLET(50 MG) BY MOUTH TWICE DAILY, Disp: 180 tablet, Rfl: 2 .  warfarin (COUMADIN) 2 MG tablet, Take as directed by [facility], Disp: 30 tablet, Rfl: 1 .  warfarin (COUMADIN) 5 MG tablet, Take 1 tablet (5 mg total) by mouth as directed., Disp: 90 tablet, Rfl: 0 .  colchicine 0.6 MG tablet, Take 2 tablets (1.2mg ) x1 now,  then in 1 hour take 1 tablet (0.6 mg) x1 (Patient not taking: Reported on 10/28/2018), Disp: 3 tablet, Rfl: 0 .  lisinopril (PRINIVIL,ZESTRIL) 40 MG tablet, Take 1 tablet (40 mg total) by mouth daily. (Patient not taking: Reported on 10/28/2018), Disp: 90 tablet, Rfl: 3  EXAM:  GENERAL: alert, oriented, appears well and in no acute distress  HEENT: atraumatic, conjunttiva clear, no obvious abnormalities on inspection of external nose and ears  NECK: normal movements of the head and neck  LUNGS: on inspection no signs of respiratory distress, breathing rate appears normal, no obvious gross SOB, gasping or wheezing  CV: no obvious cyanosis  PSYCH/NEURO: pleasant and  cooperative, no obvious depression or anxiety, speech and thought processing grossly intact  ASSESSMENT AND PLAN:  Discussed the following assessment and plan:  Sleep apnea, unspecified type  Atrial fibrillation, unspecified type ([place])  Essential hypertension - Plan: Basic metabolic panel  Hypercholesterolemia - Plan: Hepatic function panel, Lipid panel  Pulmonary hypertension ([facility])  Elevated hemoglobin ([facility]) - Plan: CBC with Differential/Platelet  Apnea, sleep Uses cpap regularly and is doing well with cpap.    Atrial fibrillation On coumadin.  Followed by cardiology.  Stable.    Essential hypertension Blood pressure has been under reasonable control.  Continue current medication regimen.  Follow pressures.  Follow metabolic panel.    Hypercholesterolemia On lovastatin.  Low cholesterol diet and exercise.  Follow lipid panel and liver function tests.    Pulmonary hypertension ([place]) Has been evaluated by cardiology.  Continue cpap.  Follow.    Elevated hemoglobin ([facility]) Elevated hemoglobin previously.  Last check 16.9.  Follow.      I discussed the assessment and treatment plan with the patient. The patient was provided an opportunity to ask questions and all were answered. The patient agreed with the plan and demonstrated an understanding of the instructions.   The patient was advised to call back or seek an in-person evaluation if the symptoms worsen or if the condition fails to improve as anticipated.    Michael Pheasant, MD

## 2018-11-01 ENCOUNTER — Encounter: Payer: Self-pay | Admitting: Internal Medicine

## 2018-11-01 DIAGNOSIS — D582 Other hemoglobinopathies: Secondary | ICD-10-CM | POA: Insufficient documentation

## 2018-11-01 NOTE — Assessment & Plan Note (Signed)
On lovastatin.  Low cholesterol diet and exercise.  Follow lipid panel and liver function tests.   

## 2018-11-01 NOTE — Assessment & Plan Note (Signed)
Uses cpap regularly and is doing well with cpap.

## 2018-11-01 NOTE — Assessment & Plan Note (Signed)
Blood pressure has been under reasonable control.  Continue current medication regimen.  Follow pressures.  Follow metabolic panel.  

## 2018-11-01 NOTE — Assessment & Plan Note (Signed)
On coumadin.  Followed by cardiology.  Stable.  

## 2018-11-01 NOTE — Assessment & Plan Note (Signed)
Has been evaluated by cardiology.  Continue cpap.  Follow.

## 2018-11-01 NOTE — Assessment & Plan Note (Signed)
Elevated hemoglobin previously.  Last check 16.9.  Follow.

## 2018-11-13 ENCOUNTER — Telehealth: Payer: Self-pay

## 2018-11-13 NOTE — Telephone Encounter (Signed)

## 2018-11-16 ENCOUNTER — Other Ambulatory Visit: Payer: Self-pay | Admitting: Cardiovascular Disease

## 2018-11-16 ENCOUNTER — Ambulatory Visit (INDEPENDENT_AMBULATORY_CARE_PROVIDER_SITE_OTHER): Payer: Medicare Other

## 2018-11-16 ENCOUNTER — Other Ambulatory Visit: Payer: Self-pay

## 2018-11-16 DIAGNOSIS — Z5181 Encounter for therapeutic drug level monitoring: Secondary | ICD-10-CM

## 2018-11-16 DIAGNOSIS — I482 Chronic atrial fibrillation, unspecified: Secondary | ICD-10-CM

## 2018-11-16 LAB — POCT INR: INR: 2.1 (ref 2.0–3.0)

## 2018-11-16 MED ORDER — WARFARIN SODIUM 5 MG PO TABS: 5.0000 mg | ORAL_TABLET | ORAL | 1 refills | Status: DC

## 2018-11-16 NOTE — Telephone Encounter (Signed)
Please review for refill. Thanks!  

## 2018-11-16 NOTE — Patient Instructions (Signed)
Please continue dosage of 5 mg daily except 7 mg on Mondays & Fridays.  Recheck in 6 weeks.

## 2018-11-24 ENCOUNTER — Other Ambulatory Visit (INDEPENDENT_AMBULATORY_CARE_PROVIDER_SITE_OTHER): Payer: Medicare Other

## 2018-11-24 ENCOUNTER — Other Ambulatory Visit: Payer: Self-pay

## 2018-11-24 DIAGNOSIS — I10 Essential (primary) hypertension: Secondary | ICD-10-CM

## 2018-11-24 DIAGNOSIS — E7800 Pure hypercholesterolemia, unspecified: Secondary | ICD-10-CM

## 2018-11-24 DIAGNOSIS — D582 Other hemoglobinopathies: Secondary | ICD-10-CM

## 2018-11-24 LAB — HEPATIC FUNCTION PANEL
ALT: 19 U/L (ref 0–53)
AST: 22 U/L (ref 0–37)
Albumin: 4 g/dL (ref 3.5–5.2)
Alkaline Phosphatase: 56 U/L (ref 39–117)
Bilirubin, Direct: 0.2 mg/dL (ref 0.0–0.3)
Total Bilirubin: 1 mg/dL (ref 0.2–1.2)
Total Protein: 6.2 g/dL (ref 6.0–8.3)

## 2018-11-24 LAB — LIPID PANEL
Cholesterol: 116 mg/dL (ref 0–200)
HDL: 31.5 mg/dL — ABNORMAL LOW (ref >39.00)
LDL Cholesterol: 48 mg/dL (ref 0–99)
NonHDL: 84.72
Total CHOL/HDL Ratio: 4
Triglycerides: 182 mg/dL — ABNORMAL HIGH (ref 0.0–149.0)
VLDL: 36.4 mg/dL (ref 0.0–40.0)

## 2018-11-24 LAB — CBC WITH DIFFERENTIAL/PLATELET
Basophils Absolute: 0.1 10*3/uL (ref 0.0–0.1)
Basophils Relative: 1 % (ref 0.0–3.0)
Eosinophils Absolute: 0.2 10*3/uL (ref 0.0–0.7)
Eosinophils Relative: 2.3 % (ref 0.0–5.0)
HCT: 46.8 % (ref 39.0–52.0)
Hemoglobin: 15.8 g/dL (ref 13.0–17.0)
Lymphocytes Relative: 21.9 % (ref 12.0–46.0)
Lymphs Abs: 2 10*3/uL (ref 0.7–4.0)
MCHC: 33.8 g/dL (ref 30.0–36.0)
MCV: 88.1 fl (ref 78.0–100.0)
Monocytes Absolute: 0.9 10*3/uL (ref 0.1–1.0)
Monocytes Relative: 9.3 % (ref 3.0–12.0)
Neutro Abs: 6.1 10*3/uL (ref 1.4–7.7)
Neutrophils Relative %: 65.5 % (ref 43.0–77.0)
Platelets: 164 10*3/uL (ref 150.0–400.0)
RBC: 5.31 Mil/uL (ref 4.22–5.81)
RDW: 13.1 % (ref 11.5–15.5)
WBC: 9.3 10*3/uL (ref 4.0–10.5)

## 2018-11-24 LAB — BASIC METABOLIC PANEL
BUN: 14 mg/dL (ref 6–23)
CO2: 31 mEq/L (ref 19–32)
Calcium: 9.1 mg/dL (ref 8.4–10.5)
Chloride: 104 mEq/L (ref 96–112)
Creatinine, Ser: 0.97 mg/dL (ref 0.40–1.50)
GFR: 76.83 mL/min (ref >60.00)
Glucose, Bld: 93 mg/dL (ref 70–99)
Potassium: 4.3 mEq/L (ref 3.5–5.1)
Sodium: 141 mEq/L (ref 135–145)

## 2018-11-25 ENCOUNTER — Encounter: Payer: Self-pay | Admitting: Internal Medicine

## 2018-12-19 ENCOUNTER — Other Ambulatory Visit: Payer: Self-pay | Admitting: Cardiovascular Disease

## 2018-12-21 NOTE — Telephone Encounter (Signed)
Refill Request.  

## 2018-12-25 ENCOUNTER — Telehealth: Payer: Self-pay

## 2018-12-25 NOTE — Telephone Encounter (Signed)

## 2018-12-28 ENCOUNTER — Other Ambulatory Visit: Payer: Self-pay

## 2018-12-28 ENCOUNTER — Ambulatory Visit (INDEPENDENT_AMBULATORY_CARE_PROVIDER_SITE_OTHER): Payer: Medicare Other

## 2018-12-28 DIAGNOSIS — I482 Chronic atrial fibrillation, unspecified: Secondary | ICD-10-CM | POA: Diagnosis not present

## 2018-12-28 DIAGNOSIS — Z5181 Encounter for therapeutic drug level monitoring: Secondary | ICD-10-CM

## 2018-12-28 LAB — POCT INR: INR: 2.7 (ref 2.0–3.0)

## 2018-12-28 NOTE — Patient Instructions (Signed)
Please continue dosage of 5 mg daily except 7 mg on Mondays & Fridays.  Recheck in 6 weeks.

## 2019-01-04 ENCOUNTER — Other Ambulatory Visit: Payer: Self-pay | Admitting: Cardiovascular Disease

## 2019-01-04 NOTE — Telephone Encounter (Signed)
Please review for refill. Thank you! 

## 2019-01-14 ENCOUNTER — Other Ambulatory Visit: Payer: Self-pay | Admitting: Cardiovascular Disease

## 2019-01-14 NOTE — Telephone Encounter (Signed)
Please review for refill.  

## 2019-01-31 ENCOUNTER — Encounter: Payer: Self-pay | Admitting: Internal Medicine

## 2019-02-01 ENCOUNTER — Other Ambulatory Visit: Payer: Self-pay

## 2019-02-01 DIAGNOSIS — N138 Other obstructive and reflux uropathy: Secondary | ICD-10-CM

## 2019-02-02 ENCOUNTER — Other Ambulatory Visit: Payer: Self-pay

## 2019-02-02 DIAGNOSIS — N138 Other obstructive and reflux uropathy: Secondary | ICD-10-CM

## 2019-02-02 DIAGNOSIS — N401 Enlarged prostate with lower urinary tract symptoms: Secondary | ICD-10-CM

## 2019-02-02 MED ORDER — FINASTERIDE 5 MG PO TABS: ORAL_TABLET | ORAL | 1 refills | Status: DC

## 2019-02-08 ENCOUNTER — Other Ambulatory Visit: Payer: Self-pay

## 2019-02-08 ENCOUNTER — Ambulatory Visit (INDEPENDENT_AMBULATORY_CARE_PROVIDER_SITE_OTHER): Payer: Medicare Other

## 2019-02-08 DIAGNOSIS — I482 Chronic atrial fibrillation, unspecified: Secondary | ICD-10-CM

## 2019-02-08 DIAGNOSIS — Z5181 Encounter for therapeutic drug level monitoring: Secondary | ICD-10-CM

## 2019-02-08 LAB — POCT INR: INR: 2.6 (ref 2.0–3.0)

## 2019-02-08 NOTE — Patient Instructions (Signed)
Please continue dosage of 5 mg daily except 7 mg on Mondays & Fridays.  Recheck in 6 weeks. 

## 2019-02-21 NOTE — Progress Notes (Signed)
Cardiology Office Note  Date:  02/22/2019   ID:  AUNER DETTLING, DOB 1951/02/02, MRN [id number]  PCP:  Einar Pheasant, MD   Chief Complaint  Patient presents with  . other    12 month follow up. Meds reviewed by the pt. verbally. "doing well."     HPI:  Mr. Suite is a 68 year old gentleman with history of  Remote history of smoking for 17 years Obesity, Paroxysmal atrial fibrillation,  cardioversion in 2011, did not hold, on chronic anticoagulation ,  sleep apnea on CPAP,   Essential hypertension,  Hyperlipidemia,  Small atrial septal defect ( QP/QS 1.08 ),  Venous insufficiency,  prior cardiac catheterization in 2011 by report showing no significant coronary disease,  who presents for follow-up of his paroxysmal atrial fibrillation, ASD, hyperlipidemia   wearing compression hose on a regular basis   Denies any significant chest pain or shortness of breath on exertion Planning more travel down to [place] for 2 months in the winter   Lasix twice a day  stable renal function: cr 0.97, BUN 14, potassium 4.3 Lipid panel total chol 116, LDL 48   tolerating warfarin, does not want to change to one of the other medications We did discuss the NOACs and data suggesting less stroke and bleeding risk He has declined again  EKG personally reviewed by myself on todays visit Shows Atrial fibrillation with ventricular rate 61 bpm    Cellulitis in 10/2017 after sitting in a car driving to [place]  Previously seen at [facility], [place] reviewed including stress echocardiogram 06/14/2016 performed for shortness of breath, exercised 6 minutes on a Bruce protocol without chest pain or ECG changes. Echo showed no wall motion abnormality concerning for ischemia  Limited echocardiogram revealed normal left ventricular function, with LV ejection fraction greater than 55%. My review of the study showed details of moderate TR, mild to moderately elevated right heart pressures, no  mention of his left atrial or right atrial size, no mention of atrial septal defect   history of paroxysmal atrial fibrillation, chads Vasc score 4, currently on warfarin, He is interested in moving to our anticoagulation clinic    history of essential hypertension,  He reports that his blood pressure is well controlled on amlodipine, furosemide, lisinopril and metoprolol tartrate.    he has chronic mild lower extremity edema, nonpitting , per the notes felt to be secondary to venous insufficiency   negative cardiac cath 2011  . Sleep apnea    PMH :   has a past medical history of Allergy, Asthma, Atrial fibrillation ([place]), Back pain, Cellulitis, Gout, History of chicken pox, Hyperlipidemia, Hypertension, Kidney stone, Sleep apnea, and Venous insufficiency.  PSH:    Past Surgical History:  Procedure Laterality Date  . APPENDECTOMY    . CARDIAC CATHETERIZATION      Current Outpatient Medications  Medication Sig Dispense Refill  . amLODipine (NORVASC) 10 MG tablet TAKE 1 TABLET(10 MG) BY MOUTH DAILY 90 tablet 2  . digoxin (LANOXIN) 0.125 MG tablet TAKE 1 TABLET(0.125 MG) BY MOUTH DAILY 90 tablet 2  . finasteride (PROSCAR) 5 MG tablet TAKE 1 TABLET(5 MG) BY MOUTH DAILY 90 tablet 1  . furosemide (LASIX) 20 MG tablet TAKE 1 TABLET TWICE DAILY AS NEEDED 180 tablet 0  . lisinopril (ZESTRIL) 20 MG tablet TAKE 1 TABLET(20 MG) BY MOUTH DAILY 90 tablet 0  . lovastatin (MEVACOR) 20 MG tablet TAKE 1 TABLET(20 MG) BY MOUTH AT BEDTIME 90 tablet 3  . metoprolol tartrate (LOPRESSOR)  50 MG tablet TAKE 1 TABLET(50 MG) BY MOUTH TWICE DAILY 180 tablet 2  . warfarin (COUMADIN) 2 MG tablet TAKE AS DIRECTED 30 tablet 1  . warfarin (COUMADIN) 5 MG tablet TAKE 1 TABLET BY MOUTH AS DIRECTED 60 tablet 1   No current facility-administered medications for this visit.      Allergies:   Patient has no known allergies.   Social History:  The patient  reports that he has quit smoking. He has never used  smokeless tobacco. He reports that he does not drink alcohol or use drugs.   Family History:   family history includes Diabetes in his father; Hypertension in his mother.    Review of Systems: Review of Systems  Constitutional: Negative.   Respiratory: Negative.   Cardiovascular: Positive for leg swelling.  Gastrointestinal: Negative.   Musculoskeletal: Negative.   Neurological: Negative.   Psychiatric/Behavioral: Negative.   All other systems reviewed and are negative.   PHYSICAL EXAM: VS:  BP 128/90 (BP Location: Left Arm, Patient Position: Sitting, Cuff Size: Large)   Pulse 62   Temp (!) 97.2 F (36.2 C)   Ht 6\' 2"  (1.88 m)   Wt 295 lb 8 oz (134 kg)   BMI 37.94 kg/m  , BMI Body mass index is 37.94 kg/m. Constitutional:  oriented to person, place, and time. No distress.  HENT:  Head: Grossly normal Eyes:  no discharge. No scleral icterus.  Neck: No JVD, no carotid bruits  Cardiovascular: Regular rate and rhythm, no murmurs appreciated 1-2 + LE swelling, mild pitting Pulmonary/Chest: Clear to auscultation bilaterally, no wheezes or rails Abdominal: Soft.  no distension.  no tenderness.  Musculoskeletal: Normal range of motion Neurological:  normal muscle tone. Coordination normal. No atrophy Skin: Skin warm and dry Psychiatric: normal affect, pleasant   Recent Labs: 04/06/2018: TSH 2.94 11/24/2018: ALT 19; BUN 14; Creatinine, Ser 0.97; Hemoglobin 15.8; Platelets 164.0; Potassium 4.3; Sodium 141    Lipid Panel Lab Results  Component Value Date   CHOL 116 11/24/2018   HDL 31.50 (L) 11/24/2018   LDLCALC 48 11/24/2018   TRIG 182.0 (H) 11/24/2018      Wt Readings from Last 3 Encounters:  02/22/19 295 lb 8 oz (134 kg)  06/15/18 294 lb (133.4 kg)  04/13/18 293 lb 3.2 oz (133 kg)     ASSESSMENT AND PLAN: Chronic atrial fibrillation (HCC) -  Tolerating anticoagulation, rate well controlled CHADS VASC 4 He does not want to change at this time to  NOAC  Essential hypertension - Blood pressure is well controlled on today's visit. No changes made to the medications. stable  Hypercholesterolemia Cholesterol is at goal on the current lipid regimen. No changes to the medications were made.  Venous insufficiency Prior history of vein surgery compression hose May 2019 with cellulitis  Encounter for anticoagulation discussion and counseling Tolerating warfarin, does not want to change   Obstructive sleep apnea On CPAP, complient  Pulmonary hypertension  Previously with elevated right heart pressures seen on echocardiogram outside our office . Pressure 45  mmHg  Tolerating Lasix 20 twice a day Stable renal function, reviewed  Non-rheumatic tricuspid valve insufficiency moderate TR per the previous echo,  no murmur appreciated on exam today  No further workup, he has decline echo  Morbid obesity ([place]) We have encouraged continued exercise, careful diet management in an effort to lose weight.recommended low carbohydrate diet  Atrial septal defect  no  mention of atrial septal defect on limited echocardiogram done at outside  office. No murmur, he has declined echo   Total encounter time more than 25 minutes  Greater than 50% was spent in counseling and coordination of care with the patient   Disposition:   F/U  12 months    Orders Placed This Encounter  Procedures  . EKG 12-Lead     Signed, Esmond Plants, M.D., Ph.D. 02/22/2019  [place], [place]

## 2019-02-22 ENCOUNTER — Other Ambulatory Visit: Payer: Self-pay

## 2019-02-22 ENCOUNTER — Encounter: Payer: Self-pay | Admitting: Cardiovascular Disease

## 2019-02-22 ENCOUNTER — Ambulatory Visit (INDEPENDENT_AMBULATORY_CARE_PROVIDER_SITE_OTHER): Payer: Medicare Other | Admitting: Cardiovascular Disease

## 2019-02-22 VITALS — BP 128/90 | HR 62 | Temp 97.2°F | Ht 74.0 in | Wt 295.5 lb

## 2019-02-22 DIAGNOSIS — G4733 Obstructive sleep apnea (adult) (pediatric): Secondary | ICD-10-CM

## 2019-02-22 DIAGNOSIS — I272 Pulmonary hypertension, unspecified: Secondary | ICD-10-CM

## 2019-02-22 DIAGNOSIS — I10 Essential (primary) hypertension: Secondary | ICD-10-CM | POA: Diagnosis not present

## 2019-02-22 DIAGNOSIS — I482 Chronic atrial fibrillation, unspecified: Secondary | ICD-10-CM

## 2019-02-22 NOTE — Patient Instructions (Signed)

## 2019-03-22 ENCOUNTER — Ambulatory Visit (INDEPENDENT_AMBULATORY_CARE_PROVIDER_SITE_OTHER): Payer: Medicare Other

## 2019-03-22 ENCOUNTER — Other Ambulatory Visit: Payer: Self-pay

## 2019-03-22 DIAGNOSIS — I482 Chronic atrial fibrillation, unspecified: Secondary | ICD-10-CM

## 2019-03-22 DIAGNOSIS — Z5181 Encounter for therapeutic drug level monitoring: Secondary | ICD-10-CM

## 2019-03-22 LAB — POCT INR: INR: 2.8 (ref 2.0–3.0)

## 2019-03-22 NOTE — Patient Instructions (Signed)
Please continue dosage of 5 mg daily except 7 mg on Mondays & Fridays.  Recheck in 6 weeks. 

## 2019-04-01 ENCOUNTER — Other Ambulatory Visit: Payer: Self-pay | Admitting: Cardiovascular Disease

## 2019-04-01 NOTE — Telephone Encounter (Signed)
Please review for refill, Thanks !  

## 2019-04-14 ENCOUNTER — Other Ambulatory Visit: Payer: Self-pay

## 2019-04-14 ENCOUNTER — Other Ambulatory Visit: Payer: Self-pay | Admitting: Cardiovascular Disease

## 2019-04-14 MED ORDER — FUROSEMIDE 20 MG PO TABS: ORAL_TABLET | ORAL | 2 refills | Status: DC

## 2019-04-14 NOTE — Telephone Encounter (Signed)
Requested Prescriptions   Signed Prescriptions Disp Refills  . furosemide (LASIX) 20 MG tablet 180 tablet 2    Sig: TAKE 1 TABLET TWICE DAILY AS NEEDED    Authorizing Provider: Minna Merritts    Ordering User: Raelene Bott, BRANDY L

## 2019-05-03 ENCOUNTER — Other Ambulatory Visit: Payer: Self-pay

## 2019-05-03 ENCOUNTER — Ambulatory Visit (INDEPENDENT_AMBULATORY_CARE_PROVIDER_SITE_OTHER): Payer: Medicare Other

## 2019-05-03 DIAGNOSIS — Z5181 Encounter for therapeutic drug level monitoring: Secondary | ICD-10-CM

## 2019-05-03 DIAGNOSIS — I482 Chronic atrial fibrillation, unspecified: Secondary | ICD-10-CM | POA: Diagnosis not present

## 2019-05-03 LAB — POCT INR: INR: 3.5 — AB (ref 2.0–3.0)

## 2019-05-03 NOTE — Patient Instructions (Signed)
Please skip warfarin tonight, then continue dosage of 5 mg daily except 7 mg on Mondays & Fridays.  Recheck in 6 weeks.

## 2019-05-13 ENCOUNTER — Encounter: Payer: Self-pay | Admitting: Internal Medicine

## 2019-05-13 ENCOUNTER — Ambulatory Visit (INDEPENDENT_AMBULATORY_CARE_PROVIDER_SITE_OTHER): Payer: Medicare Other | Admitting: Internal Medicine

## 2019-05-13 ENCOUNTER — Other Ambulatory Visit: Payer: Self-pay

## 2019-05-13 DIAGNOSIS — I4891 Unspecified atrial fibrillation: Secondary | ICD-10-CM | POA: Diagnosis not present

## 2019-05-13 DIAGNOSIS — D582 Other hemoglobinopathies: Secondary | ICD-10-CM

## 2019-05-13 DIAGNOSIS — I10 Essential (primary) hypertension: Secondary | ICD-10-CM | POA: Diagnosis not present

## 2019-05-13 DIAGNOSIS — G4733 Obstructive sleep apnea (adult) (pediatric): Secondary | ICD-10-CM

## 2019-05-13 DIAGNOSIS — I272 Pulmonary hypertension, unspecified: Secondary | ICD-10-CM | POA: Diagnosis not present

## 2019-05-13 DIAGNOSIS — E7800 Pure hypercholesterolemia, unspecified: Secondary | ICD-10-CM | POA: Diagnosis not present

## 2019-05-13 NOTE — Progress Notes (Signed)
Patient ID: Michael Blevins, male   DOB: [birth year], 68 y.o.   MRN: [id number]   Virtual Visit via video Note  This visit type was conducted due to national recommendations for restrictions regarding the COVID-19 pandemic (e.g. social distancing).  This format is felt to be most appropriate for this patient at this time.  All issues noted in this document were discussed and addressed.  No physical exam was performed (except for noted visual exam findings with Video Visits).   I connected with Michael Blevins by a video enabled telemedicine application and verified that I am speaking with the correct person using two identifiers. Location patient: home Location provider: work  Persons participating in the virtual visit: patient, provider  I discussed the limitations, risks, security and privacy concerns of performing an evaluation and management service by video and the availability of in person appointments.  The patient expressed understanding and agreed to proceed.   Reason for visit: scheduled follow up.    HPI: He reports he is doing well.  Feels good.  Legs are doing better.  Swelling better.  Skin is better.  No chest pain.  No sob.  No acid reflux.  No abdominal pain.  Bowels moving.  Has chronic afib.  No increased heart rate or palpitations.  Blood pressure doing well. Some nasal congestion - allergies.  Using saline nasal rinse.  Discussed nasacort.  Using cpap.  Overall feels good.  States turned in cologuard.  Still need results.     ROS: See pertinent positives and negatives per HPI.  Past Medical History:  Diagnosis Date  . Allergy    cats and pollen  . Asthma   . Atrial fibrillation ([place])   . Back pain   . Cellulitis   . Gout   . History of chicken pox   . Hyperlipidemia   . Hypertension   . Kidney stone   . Sleep apnea   . Venous insufficiency     Past Surgical History:  Procedure Laterality Date  . APPENDECTOMY    . CARDIAC CATHETERIZATION      Family  History  Problem Relation Age of Onset  . Hypertension Mother   . Diabetes Father   . Kidney disease Neg Hx   . Prostate cancer Neg Hx     SOCIAL HX: reviewed.     Current Outpatient Medications:  .  amLODipine (NORVASC) 10 MG tablet, TAKE 1 TABLET(10 MG) BY MOUTH DAILY, Disp: 90 tablet, Rfl: 2 .  digoxin (LANOXIN) 0.125 MG tablet, TAKE 1 TABLET(0.125 MG) BY MOUTH DAILY, Disp: 90 tablet, Rfl: 2 .  finasteride (PROSCAR) 5 MG tablet, TAKE 1 TABLET(5 MG) BY MOUTH DAILY, Disp: 90 tablet, Rfl: 1 .  furosemide (LASIX) 20 MG tablet, TAKE 1 TABLET TWICE DAILY AS NEEDED, Disp: 180 tablet, Rfl: 2 .  lisinopril (ZESTRIL) 20 MG tablet, TAKE 1 TABLET(20 MG) BY MOUTH DAILY, Disp: 90 tablet, Rfl: 2 .  lovastatin (MEVACOR) 20 MG tablet, TAKE 1 TABLET(20 MG) BY MOUTH AT BEDTIME, Disp: 90 tablet, Rfl: 3 .  metoprolol tartrate (LOPRESSOR) 50 MG tablet, TAKE 1 TABLET(50 MG) BY MOUTH TWICE DAILY, Disp: 180 tablet, Rfl: 2 .  warfarin (COUMADIN) 2 MG tablet, TAKE AS DIRECTED, Disp: 30 tablet, Rfl: 1 .  warfarin (COUMADIN) 5 MG tablet, Take as directed by the [facility], Disp: 95 tablet, Rfl: 0  EXAM:  GENERAL: alert, oriented, appears well and in no acute distress  HEENT: atraumatic, conjunttiva clear, no obvious abnormalities on inspection  of external nose and ears  NECK: normal movements of the head and neck  LUNGS: on inspection no signs of respiratory distress, breathing rate appears normal, no obvious gross SOB, gasping or wheezing  CV: no obvious cyanosis  PSYCH/NEURO: pleasant and cooperative, no obvious depression or anxiety, speech and thought processing grossly intact  ASSESSMENT AND PLAN:  Discussed the following assessment and plan:  Atrial fibrillation On coumadin.  Followed by cardiology.  Stable.    Elevated hemoglobin ([place]) Being treated for OSA.  Last hgb wnl.  Follow    Essential hypertension Blood pressure has been under reasonable control.  Follow pressures.  Continue  current medication regimen.    Hypercholesterolemia On lovastatin.  Low cholesterol diet and exercise.  Follow lipid panel and liver function tests.    Obstructive sleep apnea syndrome Using cpap regularly.    Pulmonary hypertension ([place]) Has been evaluated by cardiology.  Using cpap.  Doing well.  Follow.      I discussed the assessment and treatment plan with the patient. The patient was provided an opportunity to ask questions and all were answered. The patient agreed with the plan and demonstrated an understanding of the instructions.   The patient was advised to call back or seek an in-person evaluation if the symptoms worsen or if the condition fails to improve as anticipated.   Einar Pheasant, MD

## 2019-05-16 ENCOUNTER — Telehealth: Payer: Self-pay | Admitting: Internal Medicine

## 2019-05-16 ENCOUNTER — Encounter: Payer: Self-pay | Admitting: Internal Medicine

## 2019-05-16 NOTE — Assessment & Plan Note (Signed)
Has been evaluated by cardiology.  Using cpap.  Doing well.  Follow.

## 2019-05-16 NOTE — Assessment & Plan Note (Signed)
On coumadin.  Followed by cardiology.  Stable.  

## 2019-05-16 NOTE — Telephone Encounter (Signed)
Need results of cologuard.  States had 2-3 years ago.  Also, need to clarify if had pneumonia vaccine.

## 2019-05-16 NOTE — Assessment & Plan Note (Signed)
Using cpap regularly   

## 2019-05-16 NOTE — Assessment & Plan Note (Signed)
Being treated for OSA.  Last hgb wnl.  Follow

## 2019-05-16 NOTE — Assessment & Plan Note (Signed)
On lovastatin.  Low cholesterol diet and exercise.  Follow lipid panel and liver function tests.   

## 2019-05-16 NOTE — Assessment & Plan Note (Signed)
Blood pressure has been under reasonable control.  Follow pressures.  Continue current medication regimen.  

## 2019-05-17 NOTE — Telephone Encounter (Signed)
There is no record of him having a pneumonia shot done. I have faxed over a request for cologuard results, I do not have access to his information in the portal because it was not ordered by you. I will update pt. This is FYI for you.

## 2019-05-17 NOTE — Telephone Encounter (Signed)
Noted.  Will need to notify pt that he will need pneumonia vaccine.  Will await covid results.  If can determine when had, will need f/u covid test if has been more than 3 years.  Thanks.

## 2019-05-18 ENCOUNTER — Other Ambulatory Visit: Payer: Self-pay | Admitting: Cardiovascular Disease

## 2019-05-18 NOTE — Telephone Encounter (Signed)
Refill Request.  

## 2019-05-19 NOTE — Telephone Encounter (Signed)
Mychart message sent.

## 2019-05-26 ENCOUNTER — Ambulatory Visit (INDEPENDENT_AMBULATORY_CARE_PROVIDER_SITE_OTHER): Payer: Medicare Other

## 2019-05-26 ENCOUNTER — Other Ambulatory Visit: Payer: Self-pay

## 2019-05-26 DIAGNOSIS — Z23 Encounter for immunization: Secondary | ICD-10-CM

## 2019-05-26 NOTE — Progress Notes (Signed)
Patient presented for Prevnar 13 injection to left deltoid, patient voiced no concerns nor showed any signs of distress during injection. °

## 2019-05-31 ENCOUNTER — Other Ambulatory Visit: Payer: Self-pay

## 2019-05-31 ENCOUNTER — Other Ambulatory Visit (INDEPENDENT_AMBULATORY_CARE_PROVIDER_SITE_OTHER): Payer: Medicare Other

## 2019-05-31 DIAGNOSIS — I10 Essential (primary) hypertension: Secondary | ICD-10-CM | POA: Diagnosis not present

## 2019-05-31 DIAGNOSIS — E7800 Pure hypercholesterolemia, unspecified: Secondary | ICD-10-CM

## 2019-05-31 LAB — LIPID PANEL
Cholesterol: 130 mg/dL (ref 0–200)
HDL: 33.4 mg/dL — ABNORMAL LOW (ref >39.00)
LDL Cholesterol: 62 mg/dL (ref 0–99)
NonHDL: 96.49
Total CHOL/HDL Ratio: 4
Triglycerides: 173 mg/dL — ABNORMAL HIGH (ref 0.0–149.0)
VLDL: 34.6 mg/dL (ref 0.0–40.0)

## 2019-05-31 LAB — BASIC METABOLIC PANEL
BUN: 12 mg/dL (ref 6–23)
CO2: 30 mEq/L (ref 19–32)
Calcium: 9.1 mg/dL (ref 8.4–10.5)
Chloride: 104 mEq/L (ref 96–112)
Creatinine, Ser: 0.96 mg/dL (ref 0.40–1.50)
GFR: 77.64 mL/min (ref >60.00)
Glucose, Bld: 94 mg/dL (ref 70–99)
Potassium: 4 mEq/L (ref 3.5–5.1)
Sodium: 142 mEq/L (ref 135–145)

## 2019-05-31 LAB — HEPATIC FUNCTION PANEL
ALT: 24 U/L (ref 0–53)
AST: 24 U/L (ref 0–37)
Albumin: 4 g/dL (ref 3.5–5.2)
Alkaline Phosphatase: 62 U/L (ref 39–117)
Bilirubin, Direct: 0.2 mg/dL (ref 0.0–0.3)
Total Bilirubin: 0.9 mg/dL (ref 0.2–1.2)
Total Protein: 6.3 g/dL (ref 6.0–8.3)

## 2019-05-31 LAB — TSH: TSH: 3.17 u[IU]/mL (ref 0.35–4.50)

## 2019-06-01 ENCOUNTER — Encounter: Payer: Self-pay | Admitting: Internal Medicine

## 2019-06-14 ENCOUNTER — Other Ambulatory Visit: Payer: Self-pay

## 2019-06-14 ENCOUNTER — Ambulatory Visit (INDEPENDENT_AMBULATORY_CARE_PROVIDER_SITE_OTHER): Payer: Medicare Other

## 2019-06-14 DIAGNOSIS — Z5181 Encounter for therapeutic drug level monitoring: Secondary | ICD-10-CM | POA: Diagnosis not present

## 2019-06-14 DIAGNOSIS — I482 Chronic atrial fibrillation, unspecified: Secondary | ICD-10-CM

## 2019-06-14 LAB — POCT INR: INR: 3.2 — AB (ref 2.0–3.0)

## 2019-06-14 NOTE — Patient Instructions (Signed)
Please skip warfarin tonight, then continue dosage of 5 mg daily except 7 mg on Mondays & Fridays.  Recheck in 6 weeks.

## 2019-07-07 ENCOUNTER — Other Ambulatory Visit: Payer: Self-pay | Admitting: Cardiovascular Disease

## 2019-07-21 ENCOUNTER — Encounter: Payer: Self-pay | Admitting: Internal Medicine

## 2019-07-21 DIAGNOSIS — Z1211 Encounter for screening for malignant neoplasm of colon: Secondary | ICD-10-CM

## 2019-07-22 ENCOUNTER — Other Ambulatory Visit: Payer: Self-pay

## 2019-07-22 MED ORDER — FINASTERIDE 5 MG PO TABS: ORAL_TABLET | ORAL | 1 refills | Status: DC

## 2019-07-26 ENCOUNTER — Other Ambulatory Visit: Payer: Self-pay

## 2019-07-26 ENCOUNTER — Ambulatory Visit (INDEPENDENT_AMBULATORY_CARE_PROVIDER_SITE_OTHER): Payer: Medicare Other

## 2019-07-26 DIAGNOSIS — Z5181 Encounter for therapeutic drug level monitoring: Secondary | ICD-10-CM

## 2019-07-26 DIAGNOSIS — I482 Chronic atrial fibrillation, unspecified: Secondary | ICD-10-CM | POA: Diagnosis not present

## 2019-07-26 LAB — POCT INR: INR: 3.2 — AB (ref 2.0–3.0)

## 2019-07-26 NOTE — Patient Instructions (Signed)
START NEW DOSAGE of warfarin 5 mg every day.  Recheck in 6 weeks.

## 2019-08-12 ENCOUNTER — Encounter: Payer: Self-pay | Admitting: Internal Medicine

## 2019-08-28 IMAGING — US US EXTREM LOW VENOUS*R*
1 series · 13 of 24 positions shown · non-contrast
Comparison: None.

CLINICAL DATA: Right lower extremity pain, edema and cellulitis.
History of prior great saphenous vein stripping.



[Series 1: us extrem low venous*right* · 13 of 33 slices shown]
[im 1/33]
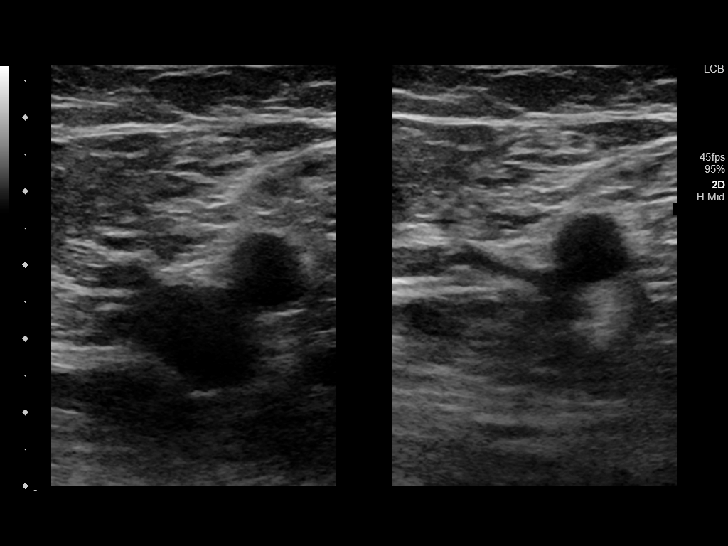
[im 3/33]
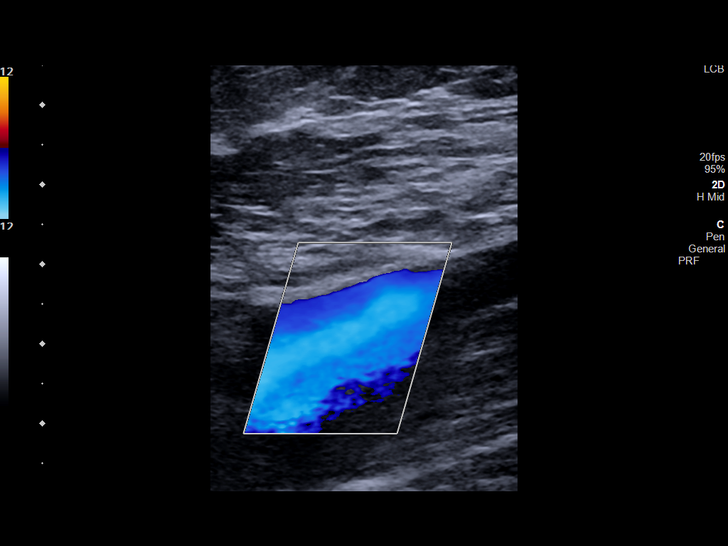
[im 6/33]
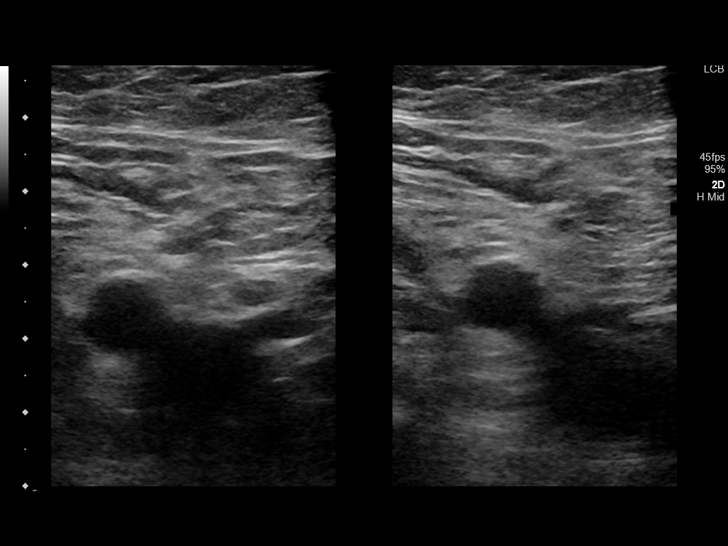
[im 9/33]
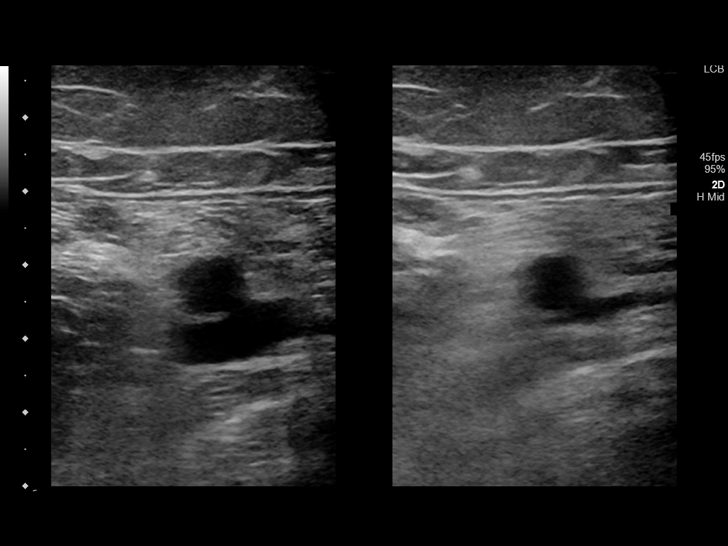
[im 12/33]
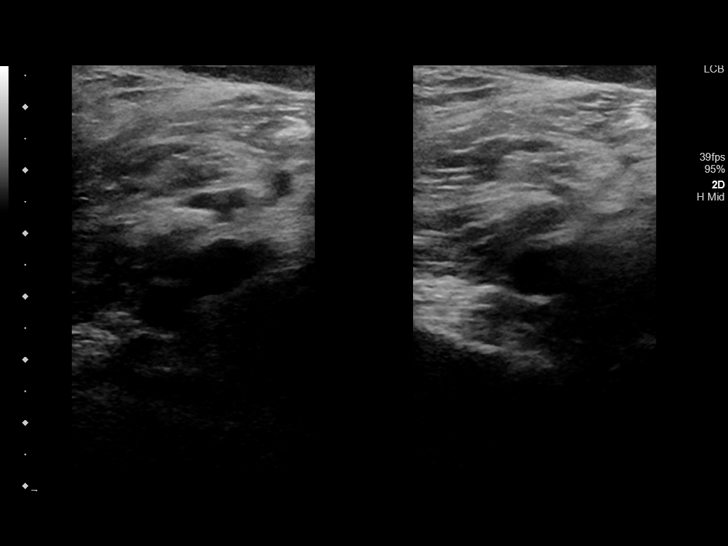
[im 14/33]
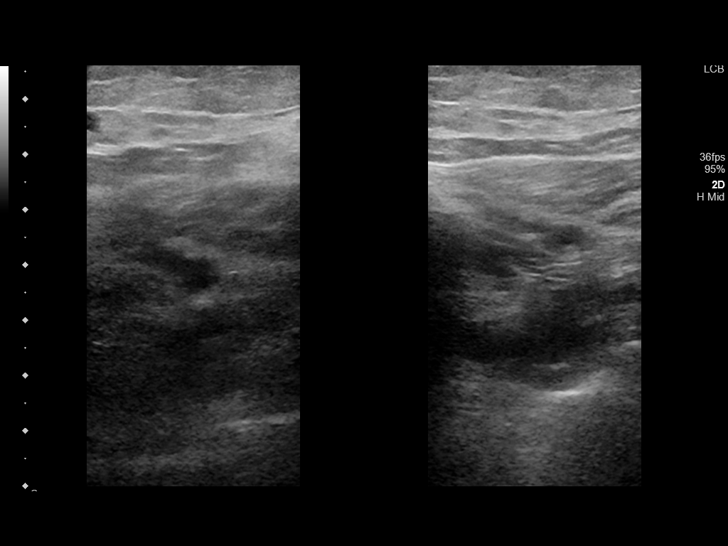
[im 17/33]
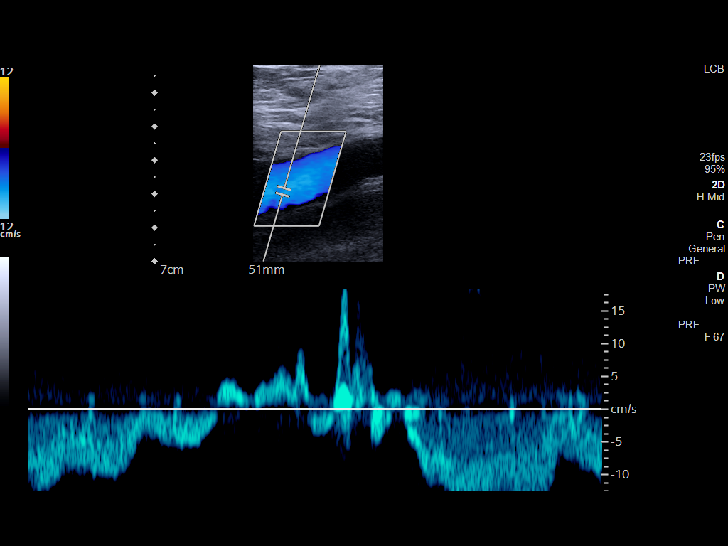
[im 19/33]
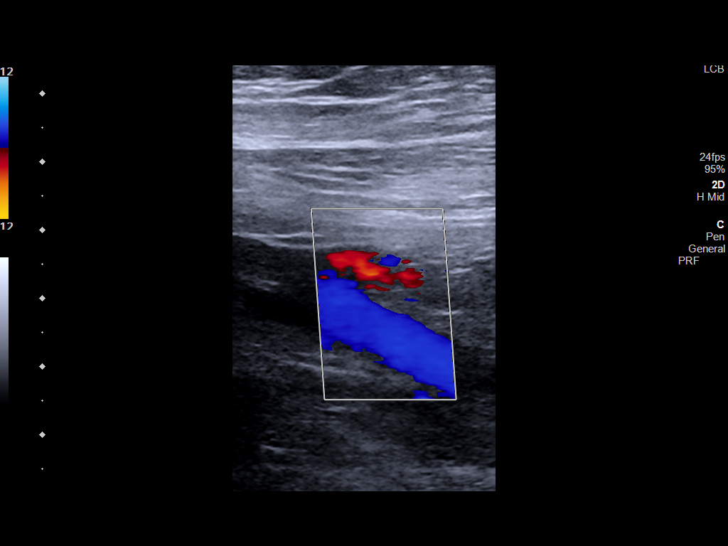
[im 21/33]
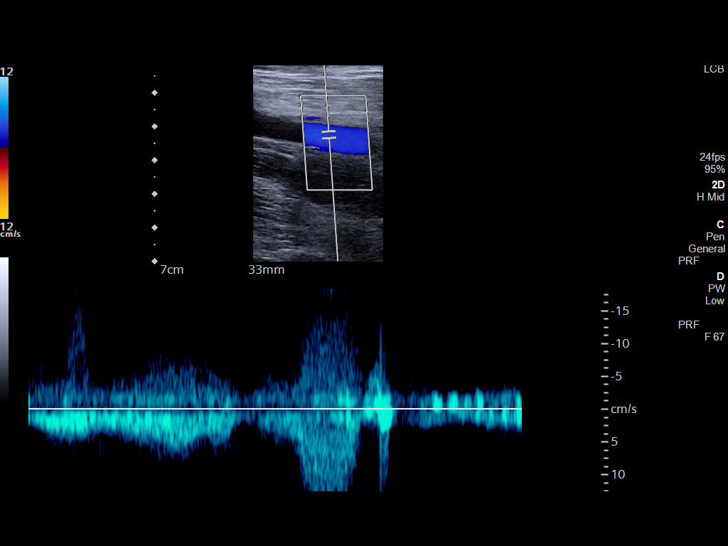
[im 24/33]
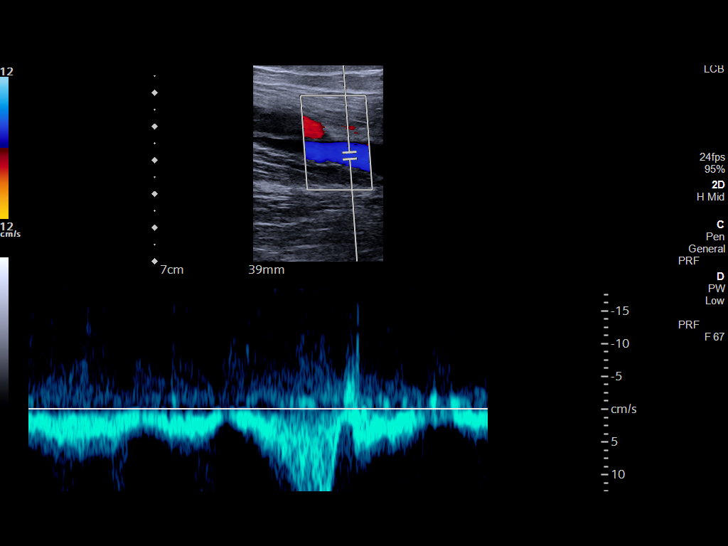
[im 27/33]
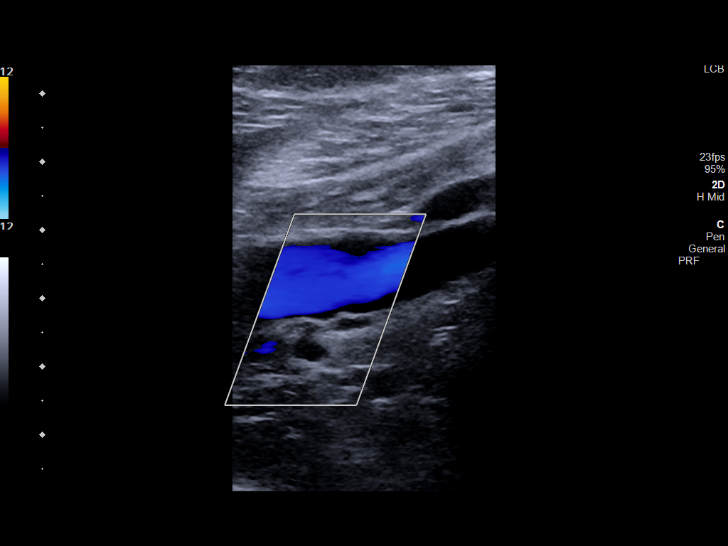
[im 30/33]
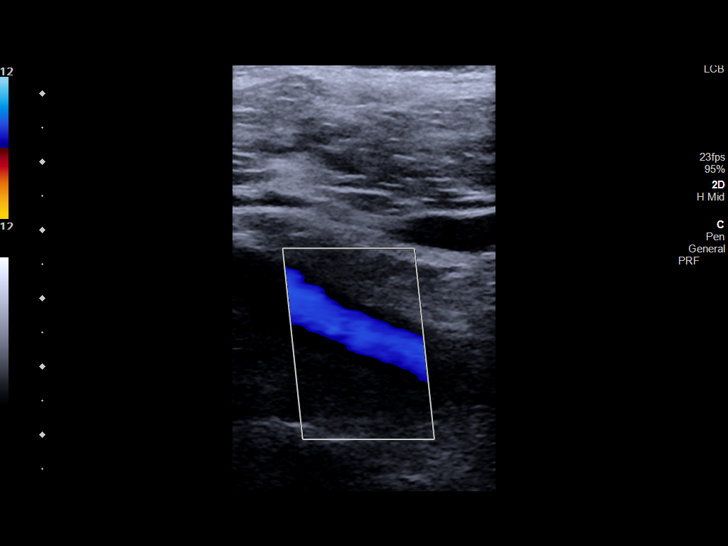
[im 33/33]
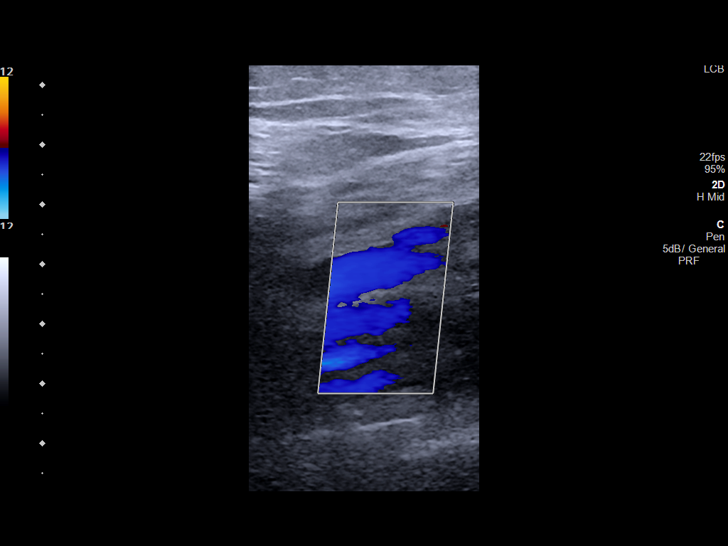

[13 of 24 positions shown; findings below may reference images not displayed]

FINDINGS: Contralateral Common Femoral Vein: Respiratory phasicity is normal
and symmetric with the symptomatic side. No evidence of thrombus.
Normal compressibility.

Common Femoral Vein: No evidence of thrombus. Normal
compressibility, respiratory phasicity and response to augmentation.

Saphenofemoral Junction: No evidence of thrombus. Normal
compressibility and flow on color Doppler imaging.

Profunda Femoral Vein: No evidence of thrombus. Normal
compressibility and flow on color Doppler imaging.

Femoral Vein: No evidence of thrombus. Normal compressibility,
respiratory phasicity and response to augmentation.

Popliteal Vein: No evidence of thrombus. Normal compressibility,
respiratory phasicity and response to augmentation.

Calf Veins: No evidence of thrombus. Normal compressibility and flow
on color Doppler imaging.

Superficial Great Saphenous Vein: Not visualized.

Venous Reflux:  None.

Other Findings: No evidence of superficial thrombophlebitis or
abnormal fluid collection.
IMPRESSION: No evidence of right lower extremity deep venous thrombosis.

## 2019-09-06 ENCOUNTER — Ambulatory Visit (INDEPENDENT_AMBULATORY_CARE_PROVIDER_SITE_OTHER): Payer: Medicare Other

## 2019-09-06 ENCOUNTER — Other Ambulatory Visit: Payer: Self-pay

## 2019-09-06 DIAGNOSIS — Z5181 Encounter for therapeutic drug level monitoring: Secondary | ICD-10-CM | POA: Diagnosis not present

## 2019-09-06 DIAGNOSIS — I482 Chronic atrial fibrillation, unspecified: Secondary | ICD-10-CM | POA: Diagnosis not present

## 2019-09-06 LAB — POCT INR: INR: 2.6 (ref 2.0–3.0)

## 2019-09-06 NOTE — Patient Instructions (Signed)
Continue dosage of warfarin 5 mg every day.  Recheck in 6 weeks.

## 2019-10-02 ENCOUNTER — Other Ambulatory Visit: Payer: Self-pay | Admitting: Cardiovascular Disease

## 2019-10-04 NOTE — Telephone Encounter (Signed)
Please review for refill, Thanks !  

## 2019-10-11 ENCOUNTER — Other Ambulatory Visit: Payer: Self-pay | Admitting: Cardiovascular Disease

## 2019-10-18 ENCOUNTER — Other Ambulatory Visit: Payer: Self-pay

## 2019-10-18 ENCOUNTER — Ambulatory Visit (INDEPENDENT_AMBULATORY_CARE_PROVIDER_SITE_OTHER): Payer: Medicare Other

## 2019-10-18 DIAGNOSIS — I482 Chronic atrial fibrillation, unspecified: Secondary | ICD-10-CM

## 2019-10-18 DIAGNOSIS — Z5181 Encounter for therapeutic drug level monitoring: Secondary | ICD-10-CM

## 2019-10-18 LAB — POCT INR: INR: 1.9 — AB (ref 2.0–3.0)

## 2019-10-18 NOTE — Patient Instructions (Signed)
-   resume dosage of warfarin 5 mg every day EXCEPT 7 mg on [place]. - recheck in 6 weeks.

## 2019-11-10 ENCOUNTER — Other Ambulatory Visit: Payer: Self-pay | Admitting: Cardiovascular Disease

## 2019-11-10 NOTE — Telephone Encounter (Signed)
Refill Request.  

## 2019-11-17 ENCOUNTER — Encounter: Payer: Self-pay | Admitting: Internal Medicine

## 2019-11-17 ENCOUNTER — Ambulatory Visit (INDEPENDENT_AMBULATORY_CARE_PROVIDER_SITE_OTHER): Payer: Medicare Other | Admitting: Internal Medicine

## 2019-11-17 ENCOUNTER — Other Ambulatory Visit: Payer: Self-pay | Admitting: Internal Medicine

## 2019-11-17 ENCOUNTER — Other Ambulatory Visit: Payer: Self-pay

## 2019-11-17 VITALS — BP 130/86 | HR 72 | Temp 97.0°F | Resp 16 | Ht 73.0 in | Wt 289.4 lb

## 2019-11-17 DIAGNOSIS — I10 Essential (primary) hypertension: Secondary | ICD-10-CM | POA: Diagnosis not present

## 2019-11-17 DIAGNOSIS — I4891 Unspecified atrial fibrillation: Secondary | ICD-10-CM

## 2019-11-17 DIAGNOSIS — E7800 Pure hypercholesterolemia, unspecified: Secondary | ICD-10-CM | POA: Diagnosis not present

## 2019-11-17 DIAGNOSIS — G4733 Obstructive sleep apnea (adult) (pediatric): Secondary | ICD-10-CM | POA: Diagnosis not present

## 2019-11-17 DIAGNOSIS — D582 Other hemoglobinopathies: Secondary | ICD-10-CM

## 2019-11-17 DIAGNOSIS — I872 Venous insufficiency (chronic) (peripheral): Secondary | ICD-10-CM

## 2019-11-17 DIAGNOSIS — I272 Pulmonary hypertension, unspecified: Secondary | ICD-10-CM

## 2019-11-17 LAB — CBC WITH DIFFERENTIAL/PLATELET
Basophils Absolute: 0.1 10*3/uL (ref 0.0–0.1)
Basophils Relative: 0.7 % (ref 0.0–3.0)
Eosinophils Absolute: 0.2 10*3/uL (ref 0.0–0.7)
Eosinophils Relative: 2.2 % (ref 0.0–5.0)
HCT: 48.1 % (ref 39.0–52.0)
Hemoglobin: 16.4 g/dL (ref 13.0–17.0)
Lymphocytes Relative: 18.5 % (ref 12.0–46.0)
Lymphs Abs: 1.8 10*3/uL (ref 0.7–4.0)
MCHC: 34.1 g/dL (ref 30.0–36.0)
MCV: 88.2 fl (ref 78.0–100.0)
Monocytes Absolute: 0.8 10*3/uL (ref 0.1–1.0)
Monocytes Relative: 8.2 % (ref 3.0–12.0)
Neutro Abs: 6.7 10*3/uL (ref 1.4–7.7)
Neutrophils Relative %: 70.4 % (ref 43.0–77.0)
Platelets: 175 10*3/uL (ref 150.0–400.0)
RBC: 5.45 Mil/uL (ref 4.22–5.81)
RDW: 13.3 % (ref 11.5–15.5)
WBC: 9.5 10*3/uL (ref 4.0–10.5)

## 2019-11-17 LAB — HEPATIC FUNCTION PANEL
ALT: 28 U/L (ref 0–53)
AST: 28 U/L (ref 0–37)
Albumin: 4.3 g/dL (ref 3.5–5.2)
Alkaline Phosphatase: 63 U/L (ref 39–117)
Bilirubin, Direct: 0.3 mg/dL (ref 0.0–0.3)
Total Bilirubin: 1.4 mg/dL — ABNORMAL HIGH (ref 0.2–1.2)
Total Protein: 6.7 g/dL (ref 6.0–8.3)

## 2019-11-17 LAB — LIPID PANEL
Cholesterol: 126 mg/dL (ref 0–200)
HDL: 32.1 mg/dL — ABNORMAL LOW (ref >39.00)
LDL Cholesterol: 60 mg/dL (ref 0–99)
NonHDL: 93.81
Total CHOL/HDL Ratio: 4
Triglycerides: 169 mg/dL — ABNORMAL HIGH (ref 0.0–149.0)
VLDL: 33.8 mg/dL (ref 0.0–40.0)

## 2019-11-17 LAB — BASIC METABOLIC PANEL
BUN: 12 mg/dL (ref 6–23)
CO2: 31 mEq/L (ref 19–32)
Calcium: 9.2 mg/dL (ref 8.4–10.5)
Chloride: 104 mEq/L (ref 96–112)
Creatinine, Ser: 0.93 mg/dL (ref 0.40–1.50)
GFR: 80.43 mL/min (ref >60.00)
Glucose, Bld: 98 mg/dL (ref 70–99)
Potassium: 3.9 mEq/L (ref 3.5–5.1)
Sodium: 141 mEq/L (ref 135–145)

## 2019-11-17 LAB — TSH: TSH: 2.24 u[IU]/mL (ref 0.35–4.50)

## 2019-11-17 NOTE — Progress Notes (Signed)
Order placed for f/u liver panel.  

## 2019-11-17 NOTE — Progress Notes (Signed)
Patient ID: Michael Blevins, male   DOB: 10/17/1950, 69 y.o.   MRN: [id number]   Subjective:    Patient ID: Michael Blevins, male    DOB: Jun 19, 1950, 69 y.o.   MRN: [id number]  HPI This visit occurred during the SARS-CoV-2 public health emergency.  Safety protocols were in place, including screening questions prior to the visit, additional usage of staff PPE, and extensive cleaning of exam room while observing appropriate contact time as indicated for disinfecting solutions.  Patient here for a scheduled follow up.  He reports he is doing well.  Feels good.  Trying to stay active.  No chest pain or sob reported.  No abdominal pain or cramping reported.  Some allergies.  Uses saline wash.  Overall appears to be relatively well controlled.  Blood pressures averaging mid 130s/80-88.    Past Medical History:  Diagnosis Date  . Allergy    cats and pollen  . Asthma   . Atrial fibrillation ([place])   . Back pain   . Cellulitis   . Gout   . History of chicken pox   . Hyperlipidemia   . Hypertension   . Kidney stone   . Sleep apnea   . Venous insufficiency    Past Surgical History:  Procedure Laterality Date  . APPENDECTOMY    . CARDIAC CATHETERIZATION     Family History  Problem Relation Age of Onset  . Hypertension Mother   . Diabetes Father   . Kidney disease Neg Hx   . Prostate cancer Neg Hx    Social History   Socioeconomic History  . Marital status: Married    Spouse name: Not on file  . Number of children: Not on file  . Years of education: Not on file  . Highest education level: Not on file  Occupational History  . Occupation: retired  Tobacco Use  . Smoking status: Former Research scientist (life sciences)  . Smokeless tobacco: Never Used  . Tobacco comment: quit 33 years  Vaping Use  . Vaping Use: Never used  Substance and Sexual Activity  . Alcohol use: No    Alcohol/week: 0.0 standard drinks  . Drug use: No  . Sexual activity: Not on file  Other Topics Concern  . Not on file    Social History Narrative  . Not on file   Social Determinants of Health   Financial Resource Strain:   . Difficulty of Paying Living Expenses:   Food Insecurity:   . Worried About Charity fundraiser in the Last Year:   . Arboriculturist in the Last Year:   Transportation Needs:   . Film/video editor (Medical):   Marland Kitchen Lack of Transportation (Non-Medical):   Physical Activity:   . Days of Exercise per Week:   . Minutes of Exercise per Session:   Stress:   . Feeling of Stress :   Social Connections:   . Frequency of Communication with Friends and Family:   . Frequency of Social Gatherings with Friends and Family:   . Attends Religious Services:   . Active Member of Clubs or Organizations:   . Attends Archivist Meetings:   Marland Kitchen Marital Status:     Outpatient Encounter Medications as of 11/17/2019  Medication Sig  . amLODipine (NORVASC) 10 MG tablet TAKE 1 TABLET(10 MG) BY MOUTH DAILY  . finasteride (PROSCAR) 5 MG tablet TAKE 1 TABLET(5 MG) BY MOUTH DAILY  . furosemide (LASIX) 20 MG tablet TAKE 1 TABLET TWICE DAILY  AS NEEDED  . lisinopril (ZESTRIL) 20 MG tablet TAKE 1 TABLET(20 MG) BY MOUTH DAILY  . lovastatin (MEVACOR) 20 MG tablet TAKE 1 TABLET(20 MG) BY MOUTH AT BEDTIME  . metoprolol tartrate (LOPRESSOR) 50 MG tablet TAKE 1 TABLET(50 MG) BY MOUTH TWICE DAILY  . warfarin (COUMADIN) 2 MG tablet TAKE AS DIRECTED. [place]  . warfarin (COUMADIN) 5 MG tablet TAKE AS DIRECTED BY THE COUMADIN CLINIC  . [DISCONTINUED] digoxin (LANOXIN) 0.125 MG tablet TAKE 1 TABLET(0.125 MG) BY MOUTH DAILY   No facility-administered encounter medications on file as of 11/17/2019.    Review of Systems  Constitutional: Negative for appetite change and unexpected weight change.  HENT: Negative for sinus pressure.        No significant allergy symptoms.    Respiratory: Negative for cough, chest tightness and shortness of breath.   Cardiovascular: Negative for chest pain and  palpitations.       Leg swelling improved.    Gastrointestinal: Negative for abdominal pain, diarrhea and nausea.  Genitourinary: Negative for difficulty urinating and dysuria.  Musculoskeletal: Negative for joint swelling and myalgias.  Skin: Negative for color change and rash.  Neurological: Negative for dizziness, light-headedness and headaches.  Psychiatric/Behavioral: Negative for agitation and dysphoric mood.       Objective:    Physical Exam Constitutional:      General: He is not in acute distress.    Appearance: Normal appearance. He is well-developed.  HENT:     Head: Normocephalic and atraumatic.     Right Ear: External ear normal.     Left Ear: External ear normal.  Eyes:     General: No scleral icterus.       Right eye: No discharge.        Left eye: No discharge.     Conjunctiva/sclera: Conjunctivae normal.  Cardiovascular:     Rate and Rhythm: Normal rate and regular rhythm.  Pulmonary:     Effort: Pulmonary effort is normal. No respiratory distress.     Breath sounds: Normal breath sounds.  Abdominal:     General: Bowel sounds are normal.     Palpations: Abdomen is soft.     Tenderness: There is no abdominal tenderness.  Musculoskeletal:        General: No tenderness.     Cervical back: Neck supple. No tenderness.     Comments: No increased edema.    Lymphadenopathy:     Cervical: No cervical adenopathy.  Skin:    Findings: No erythema or rash.  Neurological:     Mental Status: He is alert.  Psychiatric:        Mood and Affect: Mood normal.        Behavior: Behavior normal.     BP 130/86   Pulse 72   Temp (!) 97 F (36.1 C)   Resp 16   Ht 6\' 1"  (1.854 m)   Wt 289 lb 6.4 oz (131.3 kg)   SpO2 98%   BMI 38.18 kg/m  Wt Readings from Last 3 Encounters:  11/17/19 289 lb 6.4 oz (131.3 kg)  05/13/19 284 lb (128.8 kg)  02/22/19 295 lb 8 oz (134 kg)     Lab Results  Component Value Date   WBC 9.5 11/17/2019   HGB 16.4 11/17/2019   HCT 48.1  11/17/2019   PLT 175.0 11/17/2019   GLUCOSE 98 11/17/2019   CHOL 126 11/17/2019   TRIG 169.0 (H) 11/17/2019   HDL 32.10 (L) 11/17/2019  LDLDIRECT 77.0 04/06/2018   LDLCALC 60 11/17/2019   ALT 28 11/17/2019   AST 28 11/17/2019   NA 141 11/17/2019   K 3.9 11/17/2019   CL 104 11/17/2019   CREATININE 0.93 11/17/2019   BUN 12 11/17/2019   CO2 31 11/17/2019   TSH 2.24 11/17/2019   PSA 0.77 05/05/2018   INR 1.9 (A) 10/18/2019   HGBA1C 5.6 05/05/2018    US Venous Img Lower Unilateral Right  Result Date: 10/12/2017 CLINICAL DATA:  Right lower extremity pain, edema and cellulitis. History of prior great saphenous vein stripping. EXAM: RIGHT LOWER EXTREMITY VENOUS DOPPLER ULTRASOUND TECHNIQUE: Gray-scale sonography with graded compression, as well as color Doppler and duplex ultrasound were performed to evaluate the lower extremity deep venous systems from the level of the common femoral vein and including the common femoral, femoral, profunda femoral, popliteal and calf veins including the posterior tibial, peroneal and gastrocnemius veins when visible. The superficial great saphenous vein was also interrogated. Spectral Doppler was utilized to evaluate flow at rest and with distal augmentation maneuvers in the common femoral, femoral and popliteal veins. COMPARISON:  None. FINDINGS: Contralateral Common Femoral Vein: Respiratory phasicity is normal and symmetric with the symptomatic side. No evidence of thrombus. Normal compressibility. Common Femoral Vein: No evidence of thrombus. Normal compressibility, respiratory phasicity and response to augmentation. Saphenofemoral Junction: No evidence of thrombus. Normal compressibility and flow on color Doppler imaging. Profunda Femoral Vein: No evidence of thrombus. Normal compressibility and flow on color Doppler imaging. Femoral Vein: No evidence of thrombus. Normal compressibility, respiratory phasicity and response to augmentation. Popliteal Vein: No  evidence of thrombus. Normal compressibility, respiratory phasicity and response to augmentation. Calf Veins: No evidence of thrombus. Normal compressibility and flow on color Doppler imaging. Superficial Great Saphenous Vein: Not visualized. Venous Reflux:  None. Other Findings: No evidence of superficial thrombophlebitis or abnormal fluid collection. IMPRESSION: No evidence of right lower extremity deep venous thrombosis. Electronically Signed   By: Aletta Edouard M.D.   On: 10/12/2017 09:38       Assessment & Plan:   Problem List Items Addressed This Visit    Atrial fibrillation ([place])    On coumadin.  Followed by cardiology.  Stable.        Relevant Orders   Digoxin level (Completed)   Elevated hemoglobin ([place])    Being treated for OSA.  Follow cbc.        Essential hypertension    Blood pressure as outlined.  Continue metoprolol, amlodipine and lisinopril.  Follow pressures.  Follow metabolic panel.       Relevant Orders   CBC with Differential/Platelet (Completed)   TSH (Completed)   Basic metabolic panel (Completed)   Hypercholesterolemia - Primary    On lovastatin.  Low cholesterol diet and exercise.  Follow lipid panel and liver function tests.        Relevant Orders   Hepatic function panel (Completed)   Lipid panel (Completed)   Obstructive sleep apnea syndrome    Uses cpap regularly.        Pulmonary hypertension ([place])    Has been evaluated by cardiology.  Using cpap.  Doing well.  Breathing stable.  Follow.        Venous insufficiency    Has seen vascular surgery.  Wears compression hose.  Swelling improved.  Follow.            Einar Pheasant, MD

## 2019-11-18 LAB — DIGOXIN LEVEL: Digoxin Level: <0.5 mcg/L — ABNORMAL LOW (ref 0.8–2.0)

## 2019-11-21 ENCOUNTER — Telehealth: Payer: Self-pay | Admitting: Internal Medicine

## 2019-11-21 NOTE — Telephone Encounter (Signed)
-----   Message from Minna Merritts, MD sent at 11/21/2019  4:45 PM EDT ----- Regarding: RE: question  We will call him, I think we can stop the digoxin Level is not very therapeutic and his heart rate is fine with good ejection fraction Thx TG ----- Message ----- From: Einar Pheasant, MD Sent: 11/18/2019   7:07 AM EDT To: Minna Merritts, MD Subject: question                                       I saw Mr Haworth for a regular follow up appt.  He is doing well.  He is on digoxin.  He asked me to do his labs here.  I checked a digoxin level .  Level < .5.  He reports he is taking the medication.  Since he is doing well, I did not adjust the dose.  I wanted to get your input.  Let me know if I need to do something more.  Thank you for your help.   Hope you are doing well.  Einar Pheasant  .

## 2019-11-22 ENCOUNTER — Telehealth: Payer: Self-pay | Admitting: *Deleted

## 2019-11-22 NOTE — Telephone Encounter (Signed)
-----   Message from Minna Merritts, MD sent at 11/21/2019  4:45 PM EDT ----- Regarding: RE: question  We will call him, I think we can stop the digoxin Level is not very therapeutic and his heart rate is fine with good ejection fraction Thx TG ----- Message ----- From: Einar Pheasant, MD Sent: 11/18/2019   7:07 AM EDT To: Minna Merritts, MD Subject: question                                       I saw Mr Waldroup for a regular follow up appt.  He is doing well.  He is on digoxin.  He asked me to do his labs here.  I checked a digoxin level .  Level < .5.  He reports he is taking the medication.  Since he is doing well, I did not adjust the dose.  I wanted to get your input.  Let me know if I need to do something more.  Thank you for your help.   Hope you are doing well.  Einar Pheasant  .

## 2019-11-22 NOTE — Telephone Encounter (Signed)
Spoke with patient and reviewed that Dr. Rockey Situ wanted him to stop the digoxin. Reviewed provider recommendations and he verbalized understanding and was agreeable with plan. He is due for appointment in September and will forward to scheduling. He had no further questions at this time.

## 2019-11-24 ENCOUNTER — Encounter: Payer: Self-pay | Admitting: Internal Medicine

## 2019-11-24 LAB — COLOGUARD: Cologuard: NEGATIVE

## 2019-11-24 NOTE — Assessment & Plan Note (Signed)
On coumadin.  Followed by cardiology.  Stable.  

## 2019-11-24 NOTE — Assessment & Plan Note (Signed)
Uses cpap regularly.   

## 2019-11-24 NOTE — Assessment & Plan Note (Signed)
Being treated for OSA.  Follow cbc.

## 2019-11-24 NOTE — Assessment & Plan Note (Signed)
Has been evaluated by cardiology.  Using cpap.  Doing well.  Breathing stable.  Follow.

## 2019-11-24 NOTE — Assessment & Plan Note (Signed)
On lovastatin.  Low cholesterol diet and exercise.  Follow lipid panel and liver function tests.   

## 2019-11-24 NOTE — Assessment & Plan Note (Signed)
Blood pressure as outlined.  Continue metoprolol, amlodipine and lisinopril.  Follow pressures.  Follow metabolic panel.

## 2019-11-24 NOTE — Assessment & Plan Note (Signed)
Has seen vascular surgery.  Wears compression hose.  Swelling improved.  Follow.

## 2019-11-29 ENCOUNTER — Other Ambulatory Visit: Payer: Self-pay

## 2019-11-29 ENCOUNTER — Ambulatory Visit (INDEPENDENT_AMBULATORY_CARE_PROVIDER_SITE_OTHER): Payer: Medicare Other

## 2019-11-29 DIAGNOSIS — I482 Chronic atrial fibrillation, unspecified: Secondary | ICD-10-CM

## 2019-11-29 DIAGNOSIS — Z5181 Encounter for therapeutic drug level monitoring: Secondary | ICD-10-CM | POA: Diagnosis not present

## 2019-11-29 LAB — POCT INR: INR: 2.3 (ref 2.0–3.0)

## 2019-11-29 NOTE — Patient Instructions (Signed)
-   continue dosage of warfarin 5 mg every day EXCEPT 7 mg on [place]. - recheck in 6 weeks.

## 2019-12-14 ENCOUNTER — Other Ambulatory Visit (INDEPENDENT_AMBULATORY_CARE_PROVIDER_SITE_OTHER): Payer: Medicare Other

## 2019-12-14 ENCOUNTER — Other Ambulatory Visit: Payer: Self-pay

## 2019-12-14 LAB — HEPATIC FUNCTION PANEL
ALT: 25 U/L (ref 0–53)
AST: 26 U/L (ref 0–37)
Albumin: 4.3 g/dL (ref 3.5–5.2)
Alkaline Phosphatase: 65 U/L (ref 39–117)
Bilirubin, Direct: 0.3 mg/dL (ref 0.0–0.3)
Total Bilirubin: 1.1 mg/dL (ref 0.2–1.2)
Total Protein: 6.6 g/dL (ref 6.0–8.3)

## 2019-12-21 ENCOUNTER — Encounter: Payer: Self-pay | Admitting: Internal Medicine

## 2019-12-28 DIAGNOSIS — Z1211 Encounter for screening for malignant neoplasm of colon: Secondary | ICD-10-CM | POA: Diagnosis not present

## 2019-12-28 LAB — COLOGUARD: Cologuard: NEGATIVE

## 2019-12-30 ENCOUNTER — Other Ambulatory Visit: Payer: Self-pay | Admitting: Cardiovascular Disease

## 2019-12-30 NOTE — Telephone Encounter (Signed)
Refill request

## 2019-12-31 LAB — COLOGUARD: COLOGUARD: NEGATIVE

## 2020-01-09 ENCOUNTER — Other Ambulatory Visit: Payer: Self-pay | Admitting: Cardiovascular Disease

## 2020-01-09 ENCOUNTER — Other Ambulatory Visit: Payer: Self-pay | Admitting: Internal Medicine

## 2020-01-10 ENCOUNTER — Other Ambulatory Visit: Payer: Self-pay

## 2020-01-10 ENCOUNTER — Ambulatory Visit (INDEPENDENT_AMBULATORY_CARE_PROVIDER_SITE_OTHER): Payer: Medicare Other

## 2020-01-10 DIAGNOSIS — I482 Chronic atrial fibrillation, unspecified: Secondary | ICD-10-CM

## 2020-01-10 DIAGNOSIS — Z5181 Encounter for therapeutic drug level monitoring: Secondary | ICD-10-CM

## 2020-01-10 LAB — POCT INR: INR: 1.9 — AB (ref 2.0–3.0)

## 2020-01-10 NOTE — Patient Instructions (Signed)
-   take 2 of the 5 mg tablets (10 mg) tonight, then - continue dosage of warfarin 5 mg every day EXCEPT 7 mg on [place]. - recheck in 6 weeks.

## 2020-02-01 ENCOUNTER — Encounter: Payer: Self-pay | Admitting: Internal Medicine

## 2020-02-02 DIAGNOSIS — M109 Gout, unspecified: Secondary | ICD-10-CM | POA: Diagnosis not present

## 2020-02-16 ENCOUNTER — Ambulatory Visit (INDEPENDENT_AMBULATORY_CARE_PROVIDER_SITE_OTHER): Payer: Medicare Other

## 2020-02-16 ENCOUNTER — Other Ambulatory Visit: Payer: Self-pay

## 2020-02-16 DIAGNOSIS — Z5181 Encounter for therapeutic drug level monitoring: Secondary | ICD-10-CM | POA: Diagnosis not present

## 2020-02-16 DIAGNOSIS — I482 Chronic atrial fibrillation, unspecified: Secondary | ICD-10-CM

## 2020-02-16 LAB — POCT INR: INR: 2.2 (ref 2.0–3.0)

## 2020-02-16 NOTE — Patient Instructions (Signed)
-   continue dosage of warfarin 5 mg every day EXCEPT 7 mg on [place]. - recheck in 6 weeks.

## 2020-03-05 NOTE — Progress Notes (Signed)
Cardiology Office Note  Date:  03/06/2020   ID:  KATIE MOCH, DOB [birth year], MRN [id number]  PCP:  Einar Pheasant, MD   Chief Complaint  Patient presents with  . office visit    12 month F/U; Meds verbally reviewed with patient.    HPI:  Mr. Fairbank is a 69 year old gentleman with history of  Remote history of smoking for 17 years Obesity, atrial fibrillation, permanent cardioversion in 2011, did not hold, on chronic anticoagulation ,  sleep apnea on CPAP,   Essential hypertension,  Hyperlipidemia,  Small atrial septal defect ( QP/QS 1.08 ),  Venous insufficiency,  prior cardiac catheterization in 2011 by report showing no significant coronary disease,  who presents for follow-up of his permanent atrial fibrillation, ASD, hyperlipidemia  Last seen in clinic September 2020 At that time denied significant shortness of breath, Was driving to [place] , leg swelling Was taking Lasix twice a day, renal function stable Declined changed to a NOAC   wearing compression hose on a regular basis No SOB, no chest pain  Had vaccine No new sx, denies tachycardia palpitations Feels the rate is well controlled, happy on warfarin  On lasix 20 BID Not much leg swelling  Digoxin <0.5, now off Normal LFTs Total chol 126, LDL 60  EKG personally reviewed by myself on todays visit Shows Atrial fibrillation with ventricular rate 73 bpm  Other past medical history reviewed Cellulitis in 10/2017 after sitting in a car driving to [place]  Previously seen at [facility],  stress echocardiogram 06/14/2016 performed for shortness of breath, exercised 6 minutes on a Bruce protocol without chest pain or ECG changes. Echo showed no wall motion abnormality concerning for ischemia  echocardiogram revealed normal left ventricular function, with LV ejection fraction greater than 55%. My review of the study showed details of moderate TR, mild to moderately elevated right heart pressures, no  mention of his left atrial or right atrial size, no mention of atrial septal defect  chronic mild lower extremity edema, nonpitting , per the notes felt to be secondary to venous insufficiency   negative cardiac cath 2011  . Sleep apnea   PMH :   has a past medical history of Allergy, Asthma, Atrial fibrillation ([place]), Back pain, Cellulitis, Gout, History of chicken pox, Hyperlipidemia, Hypertension, Kidney stone, Sleep apnea, and Venous insufficiency.  PSH:    Past Surgical History:  Procedure Laterality Date  . APPENDECTOMY    . CARDIAC CATHETERIZATION      Current Outpatient Medications  Medication Sig Dispense Refill  . amLODipine (NORVASC) 10 MG tablet TAKE 1 TABLET(10 MG) BY MOUTH DAILY 90 tablet 0  . finasteride (PROSCAR) 5 MG tablet TAKE 1 TABLET(5 MG) BY MOUTH DAILY 90 tablet 1  . furosemide (LASIX) 20 MG tablet TAKE 1 TABLET BY MOUTH TWICE DAILY AS NEEDED 180 tablet 0  . lisinopril (ZESTRIL) 20 MG tablet TAKE 1 TABLET(20 MG) BY MOUTH DAILY 90 tablet 0  . lovastatin (MEVACOR) 20 MG tablet TAKE 1 TABLET(20 MG) BY MOUTH AT BEDTIME 90 tablet 0  . metoprolol tartrate (LOPRESSOR) 50 MG tablet TAKE 1 TABLET(50 MG) BY MOUTH TWICE DAILY 180 tablet 0  . warfarin (COUMADIN) 2 MG tablet TAKE AS DIRECTED. 90 DAY SUPPLY 30 tablet 1  . warfarin (COUMADIN) 5 MG tablet TAKE AS DIRECTED BY THE COUMADIN CLINIC 95 tablet 0   No current facility-administered medications for this visit.     Allergies:   Patient has no known allergies.   Social History:  The patient  reports that he has quit smoking. He has never used smokeless tobacco. He reports that he does not drink alcohol and does not use drugs.   Family History:   family history includes Diabetes in his father; Hypertension in his mother.    Review of Systems: Review of Systems  Constitutional: Negative.   Respiratory: Negative.   Cardiovascular: Positive for leg swelling.  Gastrointestinal: Negative.   Musculoskeletal:  Negative.   Neurological: Negative.   Psychiatric/Behavioral: Negative.   All other systems reviewed and are negative.   PHYSICAL EXAM: VS:  BP 130/80 (BP Location: Left Arm, Patient Position: Sitting, Cuff Size: Large)   Pulse 73   Ht 6\' 1"  (1.854 m)   Wt 286 lb 4 oz (129.8 kg)   SpO2 98%   BMI 37.77 kg/m  , BMI Body mass index is 37.77 kg/m. Constitutional:  oriented to person, place, and time. No distress.  HENT:  Head: Grossly normal Eyes:  no discharge. No scleral icterus.  Neck: No JVD, no carotid bruits  Cardiovascular: Irregularly irregular no murmurs appreciated Pulmonary/Chest: Clear to auscultation bilaterally, no wheezes or rails Abdominal: Soft.  no distension.  no tenderness.  Musculoskeletal: Normal range of motion Neurological:  normal muscle tone. Coordination normal. No atrophy Skin: Skin warm and dry Psychiatric: normal affect, pleasant    Recent Labs: 11/17/2019: BUN 12; Creatinine, Ser 0.93; Hemoglobin 16.4; Platelets 175.0; Potassium 3.9; Sodium 141; TSH 2.24 12/14/2019: ALT 25    Lipid Panel Lab Results  Component Value Date   CHOL 126 11/17/2019   HDL 32.10 (L) 11/17/2019   LDLCALC 60 11/17/2019   TRIG 169.0 (H) 11/17/2019      Wt Readings from Last 3 Encounters:  03/06/20 286 lb 4 oz (129.8 kg)  11/17/19 289 lb 6.4 oz (131.3 kg)  05/13/19 284 lb (128.8 kg)     ASSESSMENT AND PLAN: Chronic atrial fibrillation (H[facility]) -  Tolerating anticoagulation, rate well controlled CHADS VASC 4 Continue current medications  Essential hypertension - Blood pressure is well controlled on today's visit. No changes made to the medications. stable  Hypercholesterolemia Cholesterol is at goal on the current lipid regimen. No changes to the medications were made.  Venous insufficiency Prior history of vein surgery May 2019 with cellulitis Symptoms well controlled with compression hose even with traveling  Encounter for anticoagulation discussion and  counseling Tolerating warfarin, does not want to change   Obstructive sleep apnea On CPAP, compliant  Pulmonary hypertension  Previously with elevated right heart pressures seen on echocardiogram outside our office . Pressure 45  mmHg  We will continue Lasix 20 twice daily, renal function stable  Non-rheumatic tricuspid valve insufficiency moderate TR per the previous echo,  Previously declined echo No significant murmur on exam  Morbid obesity ([place]) We have encouraged continued exercise, careful diet management in an effort to lose weight.recommended low carbohydrate diet  Atrial septal defect  no  mention of atrial septal defect on limited echocardiogram done at outside office.  Previously declined echo   Total encounter time more than 25 minutes  Greater than 50% was spent in counseling and coordination of care with the patient     Orders Placed This Encounter  Procedures  . EKG 12-Lead     Signed, Esmond Plants, M.D., Ph.D. 03/06/2020  [place], [place]

## 2020-03-06 ENCOUNTER — Ambulatory Visit (INDEPENDENT_AMBULATORY_CARE_PROVIDER_SITE_OTHER): Payer: Medicare Other | Admitting: Cardiovascular Disease

## 2020-03-06 ENCOUNTER — Other Ambulatory Visit: Payer: Self-pay

## 2020-03-06 ENCOUNTER — Encounter: Payer: Self-pay | Admitting: Cardiovascular Disease

## 2020-03-06 VITALS — BP 130/80 | HR 73 | Ht 73.0 in | Wt 286.2 lb

## 2020-03-06 DIAGNOSIS — I10 Essential (primary) hypertension: Secondary | ICD-10-CM

## 2020-03-06 DIAGNOSIS — I272 Pulmonary hypertension, unspecified: Secondary | ICD-10-CM | POA: Diagnosis not present

## 2020-03-06 DIAGNOSIS — Q2111 Secundum atrial septal defect: Secondary | ICD-10-CM

## 2020-03-06 DIAGNOSIS — I482 Chronic atrial fibrillation, unspecified: Secondary | ICD-10-CM

## 2020-03-06 DIAGNOSIS — Q211 Atrial septal defect: Secondary | ICD-10-CM | POA: Diagnosis not present

## 2020-03-06 NOTE — Patient Instructions (Signed)
Medication Instructions:  No changes  If you need a refill on your cardiac medications before your next appointment, please call your pharmacy.    Lab work: No new labs needed   If you have labs (blood work) drawn today and your tests are completely normal, you will receive your results only by: . MyChart Message (if you have MyChart) OR . A paper copy in the mail If you have any lab test that is abnormal or we need to change your treatment, we will call you to review the results.   Testing/Procedures: No new testing needed   Follow-Up: At [facility], you and your health needs are our priority.  As part of our continuing mission to provide you with exceptional heart care, we have created designated Provider Care Teams.  These Care Teams include your primary Cardiologist (physician) and Advanced Practice Providers (APPs -  Physician Assistants and Nurse Practitioners) who all work together to provide you with the care you need, when you need it.  . You will need a follow up appointment in 12 months  . Providers on your designated Care Team:   . Christopher Berge, NP . Ryan Dunn, PA-C . Jacquelyn Visser, PA-C  Any Other Special Instructions Will Be Listed Below (If Applicable).  COVID-19 Vaccine Information can be found at: [email or web address] For questions related to vaccine distribution or appointments, please email [email or web address] or call [phone].     

## 2020-04-03 ENCOUNTER — Other Ambulatory Visit: Payer: Self-pay

## 2020-04-03 ENCOUNTER — Ambulatory Visit (INDEPENDENT_AMBULATORY_CARE_PROVIDER_SITE_OTHER): Payer: Medicare Other

## 2020-04-03 DIAGNOSIS — Z5181 Encounter for therapeutic drug level monitoring: Secondary | ICD-10-CM | POA: Diagnosis not present

## 2020-04-03 DIAGNOSIS — I482 Chronic atrial fibrillation, unspecified: Secondary | ICD-10-CM

## 2020-04-03 LAB — POCT INR: INR: 1.6 — AB (ref 2.0–3.0)

## 2020-04-03 NOTE — Patient Instructions (Signed)
-   take 10 mg tonight, then - continue dosage of warfarin 5 mg every day EXCEPT 7 mg on [place]. - recheck in 6 weeks.

## 2020-04-08 ENCOUNTER — Other Ambulatory Visit: Payer: Self-pay | Admitting: Cardiovascular Disease

## 2020-04-10 NOTE — Telephone Encounter (Signed)
Rx request sent to pharmacy.  

## 2020-05-01 ENCOUNTER — Other Ambulatory Visit: Payer: Self-pay | Admitting: Cardiovascular Disease

## 2020-05-01 MED ORDER — WARFARIN SODIUM 5 MG PO TABS
ORAL_TABLET | ORAL | 0 refills | Status: DC
Start: 2020-05-01 — End: 2020-07-28

## 2020-05-01 MED ORDER — WARFARIN SODIUM 2 MG PO TABS: ORAL_TABLET | ORAL | 1 refills | Status: DC

## 2020-05-01 NOTE — Telephone Encounter (Signed)
Refills sent in as requested. 

## 2020-05-01 NOTE — Telephone Encounter (Signed)
*  STAT* If patient is at the pharmacy, call can be transferred to refill team.   1. Which medications need to be refilled? (please list name of each medication and dose if known) metoprolol, warfarin 5 mg and 2 mg  2. Which pharmacy/location (including street and city if local pharmacy) is medication to be sent to? [facility] in [place]  3. Do they need a 30 day or 90 day supply? [place]

## 2020-05-15 ENCOUNTER — Ambulatory Visit (INDEPENDENT_AMBULATORY_CARE_PROVIDER_SITE_OTHER): Payer: Medicare Other

## 2020-05-15 ENCOUNTER — Other Ambulatory Visit: Payer: Self-pay

## 2020-05-15 DIAGNOSIS — Z5181 Encounter for therapeutic drug level monitoring: Secondary | ICD-10-CM | POA: Diagnosis not present

## 2020-05-15 DIAGNOSIS — I482 Chronic atrial fibrillation, unspecified: Secondary | ICD-10-CM | POA: Diagnosis not present

## 2020-05-15 LAB — POCT INR: INR: 2 (ref 2.0–3.0)

## 2020-05-15 NOTE — Patient Instructions (Signed)
-   continue dosage of warfarin 5 mg every day EXCEPT 7 mg on [place]. - recheck in 6 weeks.

## 2020-05-19 ENCOUNTER — Encounter: Payer: Self-pay | Admitting: Internal Medicine

## 2020-05-19 ENCOUNTER — Other Ambulatory Visit: Payer: Self-pay

## 2020-05-19 ENCOUNTER — Ambulatory Visit (INDEPENDENT_AMBULATORY_CARE_PROVIDER_SITE_OTHER): Payer: Medicare Other | Admitting: Internal Medicine

## 2020-05-19 VITALS — BP 138/84 | HR 86 | Temp 98.3°F | Resp 18 | Ht 73.0 in | Wt 290.0 lb

## 2020-05-19 DIAGNOSIS — Z1159 Encounter for screening for other viral diseases: Secondary | ICD-10-CM

## 2020-05-19 DIAGNOSIS — I4891 Unspecified atrial fibrillation: Secondary | ICD-10-CM

## 2020-05-19 DIAGNOSIS — R739 Hyperglycemia, unspecified: Secondary | ICD-10-CM | POA: Diagnosis not present

## 2020-05-19 DIAGNOSIS — I872 Venous insufficiency (chronic) (peripheral): Secondary | ICD-10-CM

## 2020-05-19 DIAGNOSIS — I10 Essential (primary) hypertension: Secondary | ICD-10-CM

## 2020-05-19 DIAGNOSIS — E7800 Pure hypercholesterolemia, unspecified: Secondary | ICD-10-CM | POA: Diagnosis not present

## 2020-05-19 DIAGNOSIS — Z125 Encounter for screening for malignant neoplasm of prostate: Secondary | ICD-10-CM | POA: Diagnosis not present

## 2020-05-19 DIAGNOSIS — Z0000 Encounter for general adult medical examination without abnormal findings: Secondary | ICD-10-CM

## 2020-05-19 DIAGNOSIS — I272 Pulmonary hypertension, unspecified: Secondary | ICD-10-CM | POA: Diagnosis not present

## 2020-05-19 LAB — LIPID PANEL
Cholesterol: 114 mg/dL (ref 0–200)
HDL: 33.5 mg/dL — ABNORMAL LOW (ref >39.00)
LDL Cholesterol: 53 mg/dL (ref 0–99)
NonHDL: 80.57
Total CHOL/HDL Ratio: 3
Triglycerides: 138 mg/dL (ref 0.0–149.0)
VLDL: 27.6 mg/dL (ref 0.0–40.0)

## 2020-05-19 LAB — BASIC METABOLIC PANEL
BUN: 12 mg/dL (ref 6–23)
CO2: 29 mEq/L (ref 19–32)
Calcium: 9.2 mg/dL (ref 8.4–10.5)
Chloride: 104 mEq/L (ref 96–112)
Creatinine, Ser: 0.94 mg/dL (ref 0.40–1.50)
GFR: 82.39 mL/min (ref >60.00)
Glucose, Bld: 96 mg/dL (ref 70–99)
Potassium: 4.1 mEq/L (ref 3.5–5.1)
Sodium: 142 mEq/L (ref 135–145)

## 2020-05-19 LAB — HEMOGLOBIN A1C: Hgb A1c MFr Bld: 5.5 % (ref 4.6–6.5)

## 2020-05-19 LAB — HEPATIC FUNCTION PANEL
ALT: 25 U/L (ref 0–53)
AST: 24 U/L (ref 0–37)
Albumin: 4.3 g/dL (ref 3.5–5.2)
Alkaline Phosphatase: 62 U/L (ref 39–117)
Bilirubin, Direct: 0.3 mg/dL (ref 0.0–0.3)
Total Bilirubin: 1.3 mg/dL — ABNORMAL HIGH (ref 0.2–1.2)
Total Protein: 6.8 g/dL (ref 6.0–8.3)

## 2020-05-19 LAB — PSA, MEDICARE: PSA: 0.53 ng/ml (ref 0.10–4.00)

## 2020-05-19 NOTE — Assessment & Plan Note (Addendum)
Physical today 05/19/20.  Followed by urology for prostate checks.  Will check psa today.  cologuard -  12/28/19 - negative.

## 2020-05-19 NOTE — Progress Notes (Signed)
Patient ID: Michael Blevins, male   DOB: [birth year], 69 y.o.   MRN: [id number]   Subjective:    Patient ID: Michael Blevins, male    DOB: 1950-11-26, 69 y.o.   MRN: [id number]  HPI This visit occurred during the SARS-CoV-2 public health emergency.  Safety protocols were in place, including screening questions prior to the visit, additional usage of staff PPE, and extensive cleaning of exam room while observing appropriate contact time as indicated for disinfecting solutions.  Patient with past history of afib, hypercholesterolemia and hypertension. He comes in today to follow up on these issues as well as for a complete physical exam.  He is doing well.  Feels good.  Sees Dr Rockey Situ for f/u afib.  Just evaluated 03/06/20 - stable.  No increased heart rate, palpitations, chest pain or sob reported.  No abdominal pain.  Bowels moving.  Building a new house.    Past Medical History:  Diagnosis Date  . Allergy    cats and pollen  . Asthma   . Atrial fibrillation ([place])   . Back pain   . Cellulitis   . Gout   . History of chicken pox   . Hyperlipidemia   . Hypertension   . Kidney stone   . Sleep apnea   . Venous insufficiency    Past Surgical History:  Procedure Laterality Date  . APPENDECTOMY    . CARDIAC CATHETERIZATION     Family History  Problem Relation Age of Onset  . Hypertension Mother   . Diabetes Father   . Kidney disease Neg Hx   . Prostate cancer Neg Hx    Social History   Socioeconomic History  . Marital status: Married    Spouse name: Not on file  . Number of children: Not on file  . Years of education: Not on file  . Highest education level: Not on file  Occupational History  . Occupation: retired  Tobacco Use  . Smoking status: Former Research scientist (life sciences)  . Smokeless tobacco: Never Used  . Tobacco comment: quit 33 years  Vaping Use  . Vaping Use: Never used  Substance and Sexual Activity  . Alcohol use: No    Alcohol/week: 0.0 standard drinks  . Drug use: No  .  Sexual activity: Not on file  Other Topics Concern  . Not on file  Social History Narrative  . Not on file   Social Determinants of Health   Financial Resource Strain: Not on file  Food Insecurity: Not on file  Transportation Needs: Not on file  Physical Activity: Not on file  Stress: Not on file  Social Connections: Not on file    Outpatient Encounter Medications as of 05/19/2020  Medication Sig  . amLODipine (NORVASC) 10 MG tablet TAKE 1 TABLET(10 MG) BY MOUTH DAILY  . finasteride (PROSCAR) 5 MG tablet TAKE 1 TABLET(5 MG) BY MOUTH DAILY  . furosemide (LASIX) 20 MG tablet TAKE 1 TABLET BY MOUTH TWICE DAILY AS NEEDED  . lisinopril (ZESTRIL) 20 MG tablet TAKE 1 TABLET(20 MG) BY MOUTH DAILY  . lovastatin (MEVACOR) 20 MG tablet TAKE 1 TABLET(20 MG) BY MOUTH AT BEDTIME  . metoprolol tartrate (LOPRESSOR) 50 MG tablet TAKE 1 TABLET(50 MG) BY MOUTH TWICE DAILY  . warfarin (COUMADIN) 2 MG tablet TAKE AS DIRECTED. [place]  . warfarin (COUMADIN) 5 MG tablet Take as directed by the anti-coag clinic.   No facility-administered encounter medications on file as of 05/19/2020.    Review of Systems  Constitutional: Negative for appetite change and unexpected weight change.  HENT: Negative for congestion, sinus pressure and sore throat.   Eyes: Negative for pain and visual disturbance.  Respiratory: Negative for cough, chest tightness and shortness of breath.   Cardiovascular: Negative for chest pain, palpitations and leg swelling.  Gastrointestinal: Negative for abdominal pain, diarrhea, nausea and vomiting.  Genitourinary: Negative for difficulty urinating and dysuria.  Musculoskeletal: Negative for joint swelling and myalgias.  Skin: Negative for color change and rash.  Neurological: Negative for dizziness, light-headedness and headaches.  Hematological: Negative for adenopathy. Does not bruise/bleed easily.  Psychiatric/Behavioral: Negative for agitation and dysphoric mood.        Objective:    Physical Exam Vitals reviewed.  Constitutional:      General: He is not in acute distress.    Appearance: Normal appearance. He is well-developed and well-nourished.  HENT:     Head: Normocephalic and atraumatic.     Right Ear: External ear normal.     Left Ear: External ear normal.     Mouth/Throat:     Mouth: Oropharynx is clear and moist.  Eyes:     General: No scleral icterus.       Right eye: No discharge.        Left eye: No discharge.     Conjunctiva/sclera: Conjunctivae normal.  Neck:     Thyroid: No thyromegaly.  Cardiovascular:     Rate and Rhythm: Normal rate and regular rhythm.  Pulmonary:     Effort: No respiratory distress.     Breath sounds: Normal breath sounds. No wheezing.  Abdominal:     General: Bowel sounds are normal.     Palpations: Abdomen is soft.     Tenderness: There is no abdominal tenderness.  Genitourinary:    Comments: Followed by urology.  Musculoskeletal:        General: No swelling, tenderness or edema.     Cervical back: Neck supple. No tenderness.  Lymphadenopathy:     Cervical: No cervical adenopathy.  Skin:    Findings: No erythema or rash.  Neurological:     Mental Status: He is alert and oriented to person, place, and time.  Psychiatric:        Mood and Affect: Mood and affect and mood normal.        Behavior: Behavior normal.     BP 138/84   Pulse 86   Temp 98.3 F (36.8 C) (Oral)   Resp 18   Ht _0  (1.854 m)   Wt 290 lb (131.5 kg)   SpO2 97%   BMI 38.26 kg/m  Wt Readings from Last 3 Encounters:  05/19/20 290 lb (131.5 kg)  03/06/20 286 lb 4 oz (129.8 kg)  11/17/19 289 lb 6.4 oz (131.3 kg)     Lab Results  Component Value Date   WBC 9.5 11/17/2019   HGB 16.4 11/17/2019   HCT 48.1 11/17/2019   PLT 175.0 11/17/2019   GLUCOSE 96 05/19/2020   CHOL 114 05/19/2020   TRIG 138.0 05/19/2020   HDL 33.50 (L) 05/19/2020   LDLDIRECT 77.0 04/06/2018   LDLCALC 53 05/19/2020   ALT 25 05/19/2020    AST 24 05/19/2020   NA 142 05/19/2020   K 4.1 05/19/2020   CL 104 05/19/2020   CREATININE 0.94 05/19/2020   BUN 12 05/19/2020   CO2 29 05/19/2020   TSH 2.24 11/17/2019   PSA 0.53 05/19/2020   INR 2.0 05/15/2020   HGBA1C 5.5 05/19/2020  US Venous Img Lower Unilateral Right  Result Date: 10/12/2017 CLINICAL DATA:  Right lower extremity pain, edema and cellulitis. History of prior great saphenous vein stripping. EXAM: RIGHT LOWER EXTREMITY VENOUS DOPPLER ULTRASOUND TECHNIQUE: Gray-scale sonography with graded compression, as well as color Doppler and duplex ultrasound were performed to evaluate the lower extremity deep venous systems from the level of the common femoral vein and including the common femoral, femoral, profunda femoral, popliteal and calf veins including the posterior tibial, peroneal and gastrocnemius veins when visible. The superficial great saphenous vein was also interrogated. Spectral Doppler was utilized to evaluate flow at rest and with distal augmentation maneuvers in the common femoral, femoral and popliteal veins. COMPARISON:  None. FINDINGS: Contralateral Common Femoral Vein: Respiratory phasicity is normal and symmetric with the symptomatic side. No evidence of thrombus. Normal compressibility. Common Femoral Vein: No evidence of thrombus. Normal compressibility, respiratory phasicity and response to augmentation. Saphenofemoral Junction: No evidence of thrombus. Normal compressibility and flow on color Doppler imaging. Profunda Femoral Vein: No evidence of thrombus. Normal compressibility and flow on color Doppler imaging. Femoral Vein: No evidence of thrombus. Normal compressibility, respiratory phasicity and response to augmentation. Popliteal Vein: No evidence of thrombus. Normal compressibility, respiratory phasicity and response to augmentation. Calf Veins: No evidence of thrombus. Normal compressibility and flow on color Doppler imaging. Superficial Great Saphenous  Vein: Not visualized. Venous Reflux:  None. Other Findings: No evidence of superficial thrombophlebitis or abnormal fluid collection. IMPRESSION: No evidence of right lower extremity deep venous thrombosis. Electronically Signed   By: Aletta Edouard M.D.   On: 10/12/2017 09:38       Assessment & Plan:   Problem List Items Addressed This Visit    Venous insufficiency    Continue compression hose.  Swelling improved.       Pulmonary hypertension ([place])    Has been evaluated by cardiology.  Uses cpap.  Breathing stable.       Hyperglycemia    Low carb diet and exercise.  Follow met b and a1c.       Relevant Orders   Hemoglobin A1c (Completed)   Hypercholesterolemia - Primary    On lovastatin.  Low cholesterol diet and exercise.  Follow lipid panel and liver function tests.       Relevant Orders   Hepatic function panel (Completed)   Lipid panel (Completed)   Health care maintenance    Physical today 05/19/20.  Followed by urology for prostate checks.  Will check psa today.  cologuard -  12/28/19 - negative.       Essential hypertension    On metoprolol, amlodipine and lisinopril.  Recheck improved.  Blood pressure ok at cardiology.  Spot check pressure.  Continue current medication.  Follow met b.       Relevant Orders   Basic metabolic panel (Completed)   Atrial fibrillation ([place])    Followed by cardiology. On coumadin.        Other Visit Diagnoses    Prostate cancer screening       Relevant Orders   PSA, Medicare (Completed)   Need for hepatitis C screening test       Relevant Orders   Hepatitis C antibody       Einar Pheasant, MD

## 2020-05-20 ENCOUNTER — Encounter: Payer: Self-pay | Admitting: Internal Medicine

## 2020-05-20 ENCOUNTER — Other Ambulatory Visit: Payer: Self-pay | Admitting: Internal Medicine

## 2020-05-20 NOTE — Assessment & Plan Note (Signed)
Continue compression hose. Swelling improved.  

## 2020-05-20 NOTE — Assessment & Plan Note (Signed)
On metoprolol, amlodipine and lisinopril.  Recheck improved.  Blood pressure ok at cardiology.  Spot check pressure.  Continue current medication.  Follow met b.

## 2020-05-20 NOTE — Assessment & Plan Note (Signed)
Has been evaluated by cardiology.  Uses cpap.  Breathing stable.

## 2020-05-20 NOTE — Assessment & Plan Note (Signed)
Low carb diet and exercise.  Follow met b and a1c.  

## 2020-05-20 NOTE — Progress Notes (Signed)
Order placed for f/u liver panel.  

## 2020-05-20 NOTE — Assessment & Plan Note (Signed)
Followed by cardiology. On coumadin.

## 2020-05-20 NOTE — Assessment & Plan Note (Signed)
On lovastatin.  Low cholesterol diet and exercise.  Follow lipid panel and liver function tests.   

## 2020-05-22 LAB — HEPATITIS C ANTIBODY
Hepatitis C Ab: NONREACTIVE
SIGNAL TO CUT-OFF: 0.01 (ref <1.00)

## 2020-05-30 ENCOUNTER — Telehealth: Payer: Self-pay | Admitting: Internal Medicine

## 2020-05-30 NOTE — Telephone Encounter (Signed)
Left message for patient to call back and schedule Medicare Annual Wellness Visit (AWV)  ° °This should be a telephone visit only=30 minutes. ° °No hx of AWV; please schedule at anytime with Denisa O'Brien-Blaney at [facility] [place] ° ° °

## 2020-06-28 ENCOUNTER — Ambulatory Visit (INDEPENDENT_AMBULATORY_CARE_PROVIDER_SITE_OTHER): Payer: Medicare Other

## 2020-06-28 ENCOUNTER — Other Ambulatory Visit: Payer: Self-pay

## 2020-06-28 DIAGNOSIS — Z5181 Encounter for therapeutic drug level monitoring: Secondary | ICD-10-CM

## 2020-06-28 DIAGNOSIS — I482 Chronic atrial fibrillation, unspecified: Secondary | ICD-10-CM | POA: Diagnosis not present

## 2020-06-28 LAB — POCT INR: INR: 3.2 — AB (ref 2.0–3.0)

## 2020-06-28 NOTE — Patient Instructions (Signed)
-   take 2 mg tablet tonight, then  - continue dosage of warfarin 5 mg every day EXCEPT 7 mg on [place]. - recheck in 6 weeks.

## 2020-07-04 ENCOUNTER — Other Ambulatory Visit: Payer: Medicare Other

## 2020-07-07 ENCOUNTER — Other Ambulatory Visit: Payer: Self-pay | Admitting: Cardiovascular Disease

## 2020-07-07 ENCOUNTER — Other Ambulatory Visit: Payer: Self-pay | Admitting: Internal Medicine

## 2020-07-28 ENCOUNTER — Telehealth: Payer: Self-pay | Admitting: Cardiovascular Disease

## 2020-07-28 MED ORDER — METOPROLOL TARTRATE 50 MG PO TABS: ORAL_TABLET | ORAL | 1 refills | Status: DC

## 2020-07-28 MED ORDER — WARFARIN SODIUM 5 MG PO TABS: ORAL_TABLET | ORAL | 1 refills | Status: DC

## 2020-07-28 NOTE — Telephone Encounter (Signed)
*  STAT* If patient is at the pharmacy, call can be transferred to refill team.   1. Which medications need to be refilled? (please list name of each medication and dose if known)  metoprolol tartrate 50 MG 1 tablet twice daily  Warfarin 5 MG   2. Which pharmacy/location (including street and city if local pharmacy) is medication to be sent to? [facility] in [place]   3. Do they need a 30 day or 90 day supply? 90 day

## 2020-07-28 NOTE — Addendum Note (Signed)
Addended by: Brynda Peon on: 07/28/2020 09:39 AM   Modules accepted: Orders

## 2020-07-28 NOTE — Telephone Encounter (Signed)
Please review refill request for warfarin-thank you!  Requested Prescriptions   Signed Prescriptions Disp Refills   metoprolol tartrate (LOPRESSOR) 50 MG tablet 180 tablet 1    Sig: TAKE 1 TABLET BY MOUTH TWICE DAILY    Authorizing Provider: Minna Merritts    Ordering User: Raelene Bott, Raine Blodgett L

## 2020-08-09 ENCOUNTER — Other Ambulatory Visit: Payer: Self-pay

## 2020-08-09 ENCOUNTER — Ambulatory Visit (INDEPENDENT_AMBULATORY_CARE_PROVIDER_SITE_OTHER): Payer: Medicare Other

## 2020-08-09 DIAGNOSIS — I482 Chronic atrial fibrillation, unspecified: Secondary | ICD-10-CM

## 2020-08-09 DIAGNOSIS — Z5181 Encounter for therapeutic drug level monitoring: Secondary | ICD-10-CM | POA: Diagnosis not present

## 2020-08-09 LAB — POCT INR: INR: 2.5 (ref 2.0–3.0)

## 2020-08-09 NOTE — Patient Instructions (Signed)
-   continue dosage of warfarin 5 mg every day EXCEPT 7 mg on [place]. - recheck in 6 weeks.

## 2020-08-30 ENCOUNTER — Encounter: Payer: Self-pay | Admitting: Internal Medicine

## 2020-09-20 ENCOUNTER — Ambulatory Visit (INDEPENDENT_AMBULATORY_CARE_PROVIDER_SITE_OTHER): Payer: Medicare Other

## 2020-09-20 ENCOUNTER — Other Ambulatory Visit: Payer: Self-pay

## 2020-09-20 DIAGNOSIS — Z5181 Encounter for therapeutic drug level monitoring: Secondary | ICD-10-CM | POA: Diagnosis not present

## 2020-09-20 DIAGNOSIS — I482 Chronic atrial fibrillation, unspecified: Secondary | ICD-10-CM

## 2020-09-20 LAB — POCT INR: INR: 2.9 (ref 2.0–3.0)

## 2020-09-20 NOTE — Patient Instructions (Signed)
-   continue dosage of warfarin 5 mg every day EXCEPT 7 mg on [place]. - recheck in 6 weeks.

## 2020-10-23 ENCOUNTER — Other Ambulatory Visit: Payer: Self-pay | Admitting: Cardiovascular Disease

## 2020-11-01 ENCOUNTER — Other Ambulatory Visit: Payer: Self-pay

## 2020-11-01 ENCOUNTER — Ambulatory Visit (INDEPENDENT_AMBULATORY_CARE_PROVIDER_SITE_OTHER): Payer: Medicare Other

## 2020-11-01 DIAGNOSIS — I482 Chronic atrial fibrillation, unspecified: Secondary | ICD-10-CM | POA: Diagnosis not present

## 2020-11-01 DIAGNOSIS — Z5181 Encounter for therapeutic drug level monitoring: Secondary | ICD-10-CM

## 2020-11-01 LAB — POCT INR: INR: 2.3 (ref 2.0–3.0)

## 2020-11-01 NOTE — Patient Instructions (Signed)
-   continue dosage of warfarin 5 mg every day EXCEPT 7 mg on [place]. - recheck in 6 weeks.

## 2020-11-02 ENCOUNTER — Other Ambulatory Visit: Payer: Self-pay | Admitting: Cardiovascular Disease

## 2020-11-02 NOTE — Telephone Encounter (Signed)
Please review for refill, thanks ! 

## 2020-11-02 NOTE — Telephone Encounter (Signed)
Please review for refill. Thank you! 

## 2020-11-02 NOTE — Telephone Encounter (Signed)
Please review for refill. Thanks!  

## 2020-11-17 ENCOUNTER — Ambulatory Visit: Payer: Medicare Other | Admitting: Internal Medicine

## 2020-12-13 ENCOUNTER — Other Ambulatory Visit: Payer: Self-pay

## 2020-12-13 ENCOUNTER — Ambulatory Visit (INDEPENDENT_AMBULATORY_CARE_PROVIDER_SITE_OTHER): Payer: Medicare Other | Admitting: Pharmacist

## 2020-12-13 DIAGNOSIS — I482 Chronic atrial fibrillation, unspecified: Secondary | ICD-10-CM | POA: Diagnosis not present

## 2020-12-13 DIAGNOSIS — Z5181 Encounter for therapeutic drug level monitoring: Secondary | ICD-10-CM | POA: Diagnosis not present

## 2020-12-13 LAB — POCT INR: INR: 2.5 (ref 2.0–3.0)

## 2020-12-13 NOTE — Patient Instructions (Signed)
Description   - continue dosage of warfarin 5 mg every day EXCEPT 7 mg on [place]. - recheck in 6 weeks.

## 2020-12-15 ENCOUNTER — Ambulatory Visit: Payer: Medicare Other | Admitting: Internal Medicine

## 2020-12-16 ENCOUNTER — Other Ambulatory Visit: Payer: Self-pay | Admitting: Internal Medicine

## 2021-01-22 ENCOUNTER — Other Ambulatory Visit: Payer: Self-pay

## 2021-01-22 MED ORDER — METOPROLOL TARTRATE 50 MG PO TABS: 50.0000 mg | ORAL_TABLET | Freq: Two times a day (BID) | ORAL | 0 refills | Status: DC

## 2021-01-22 NOTE — Telephone Encounter (Signed)
*  STAT* If patient is at the pharmacy, call can be transferred to refill team.   1. Which medications need to be refilled? (please list name of each medication and dose if known) Metoprolol  2. Which pharmacy/location (including street and city if local pharmacy) is medication to be sent to? [facility] [place]  3. Do they need a 30 day or 90 day supply? [place]

## 2021-01-24 ENCOUNTER — Ambulatory Visit (INDEPENDENT_AMBULATORY_CARE_PROVIDER_SITE_OTHER): Payer: Medicare Other

## 2021-01-24 ENCOUNTER — Other Ambulatory Visit: Payer: Self-pay

## 2021-01-24 DIAGNOSIS — I482 Chronic atrial fibrillation, unspecified: Secondary | ICD-10-CM

## 2021-01-24 DIAGNOSIS — Z5181 Encounter for therapeutic drug level monitoring: Secondary | ICD-10-CM

## 2021-01-24 LAB — POCT INR: INR: 2.2 (ref 2.0–3.0)

## 2021-01-24 NOTE — Patient Instructions (Signed)
-   continue dosage of warfarin 5 mg every day EXCEPT 7 mg on [place]. - recheck in 6 weeks.

## 2021-01-26 ENCOUNTER — Telehealth: Payer: Self-pay

## 2021-01-26 ENCOUNTER — Other Ambulatory Visit: Payer: Self-pay | Admitting: Cardiovascular Disease

## 2021-01-26 MED ORDER — WARFARIN SODIUM 2 MG PO TABS: ORAL_TABLET | ORAL | 0 refills | Status: DC

## 2021-01-26 NOTE — Telephone Encounter (Signed)
Sent in refill for the '2mg'$  as I have already ent one for the '5mg'$ 

## 2021-01-26 NOTE — Telephone Encounter (Signed)
*  STAT* If patient is at the pharmacy, call can be transferred to refill team.   1. Which medications need to be refilled? (please list name of each medication and dose if known) warfarin  2. Which pharmacy/location (including street and city if local pharmacy) is medication to be sent to?  [facility] [place]  3. Do they need a 30 day or 90 day supply? Qty [id number]

## 2021-01-26 NOTE — Telephone Encounter (Signed)
Refill request

## 2021-02-20 DIAGNOSIS — D2371 Other benign neoplasm of skin of right lower limb, including hip: Secondary | ICD-10-CM | POA: Diagnosis not present

## 2021-02-20 DIAGNOSIS — L608 Other nail disorders: Secondary | ICD-10-CM | POA: Diagnosis not present

## 2021-02-20 DIAGNOSIS — L601 Onycholysis: Secondary | ICD-10-CM | POA: Diagnosis not present

## 2021-02-20 DIAGNOSIS — L538 Other specified erythematous conditions: Secondary | ICD-10-CM | POA: Diagnosis not present

## 2021-02-20 DIAGNOSIS — I872 Venous insufficiency (chronic) (peripheral): Secondary | ICD-10-CM | POA: Diagnosis not present

## 2021-02-20 DIAGNOSIS — D3612 Benign neoplasm of peripheral nerves and autonomic nervous system, upper limb, including shoulder: Secondary | ICD-10-CM | POA: Diagnosis not present

## 2021-02-20 DIAGNOSIS — L918 Other hypertrophic disorders of the skin: Secondary | ICD-10-CM | POA: Diagnosis not present

## 2021-02-20 DIAGNOSIS — L97829 Non-pressure chronic ulcer of other part of left lower leg with unspecified severity: Secondary | ICD-10-CM | POA: Diagnosis not present

## 2021-02-27 ENCOUNTER — Other Ambulatory Visit: Payer: Self-pay

## 2021-02-27 ENCOUNTER — Ambulatory Visit (INDEPENDENT_AMBULATORY_CARE_PROVIDER_SITE_OTHER): Payer: Medicare Other | Admitting: Internal Medicine

## 2021-02-27 VITALS — BP 122/72 | HR 88 | Temp 98.0°F | Resp 16 | Ht 73.0 in | Wt 291.2 lb

## 2021-02-27 DIAGNOSIS — D582 Other hemoglobinopathies: Secondary | ICD-10-CM

## 2021-02-27 DIAGNOSIS — I272 Pulmonary hypertension, unspecified: Secondary | ICD-10-CM

## 2021-02-27 DIAGNOSIS — G4730 Sleep apnea, unspecified: Secondary | ICD-10-CM | POA: Diagnosis not present

## 2021-02-27 DIAGNOSIS — I4891 Unspecified atrial fibrillation: Secondary | ICD-10-CM

## 2021-02-27 DIAGNOSIS — R739 Hyperglycemia, unspecified: Secondary | ICD-10-CM

## 2021-02-27 DIAGNOSIS — Z6835 Body mass index (BMI) 35.0-35.9, adult: Secondary | ICD-10-CM

## 2021-02-27 DIAGNOSIS — Z23 Encounter for immunization: Secondary | ICD-10-CM

## 2021-02-27 DIAGNOSIS — E7800 Pure hypercholesterolemia, unspecified: Secondary | ICD-10-CM | POA: Diagnosis not present

## 2021-02-27 DIAGNOSIS — I10 Essential (primary) hypertension: Secondary | ICD-10-CM

## 2021-02-27 NOTE — Progress Notes (Signed)
Patient ID: Michael Blevins, male   DOB: 12/28/1950, 70 y.o.   MRN: [id number]   Subjective:    Patient ID: Michael Blevins, male    DOB: [birth year], 70 y.o.   MRN: [id number]  This visit occurred during the SARS-CoV-2 public health emergency.  Safety protocols were in place, including screening questions prior to the visit, additional usage of staff PPE, and extensive cleaning of exam room while observing appropriate contact time as indicated for disinfecting solutions.   Patient here for a scheduled followup.   Chief Complaint  Patient presents with   Hyperlipidemia   Hypertension   .   HPI He is doing well.  Just moved.  Has some issues with aching in feet and hands - with moving.  Better now.  No chest pain.  Breathing stable.  No nausea or vomiting reported.  No abdominal pain.  Bowels moving.  Recent saw dermatology.  Has f/u with them regarding his finger nail.     Past Medical History:  Diagnosis Date   Allergy    cats and pollen   Asthma    Atrial fibrillation ([facility])    Back pain    Cellulitis    Gout    History of chicken pox    Hyperlipidemia    Hypertension    Kidney stone    Sleep apnea    Venous insufficiency    Past Surgical History:  Procedure Laterality Date   APPENDECTOMY     CARDIAC CATHETERIZATION     Family History  Problem Relation Age of Onset   Hypertension Mother    Diabetes Father    Kidney disease Neg Hx    Prostate cancer Neg Hx    Social History   Socioeconomic History   Marital status: Married    Spouse name: Not on file   Number of children: Not on file   Years of education: Not on file   Highest education level: Not on file  Occupational History   Occupation: retired  Tobacco Use   Smoking status: Former   Smokeless tobacco: Never   Tobacco comments:    quit 33 years  Vaping Use   Vaping Use: Never used  Substance and Sexual Activity   Alcohol use: No    Alcohol/week: 0.0 standard drinks   Drug use: No   Sexual  activity: Not on file  Other Topics Concern   Not on file  Social History Narrative   Not on file   Social Determinants of Health   Financial Resource Strain: Not on file  Food Insecurity: Not on file  Transportation Needs: Not on file  Physical Activity: Not on file  Stress: Not on file  Social Connections: Not on file     Review of Systems  Constitutional:  Negative for appetite change and unexpected weight change.  HENT:  Negative for congestion and sinus pressure.   Respiratory:  Negative for cough, chest tightness and shortness of breath.   Cardiovascular:  Negative for chest pain, palpitations and leg swelling.  Gastrointestinal:  Negative for abdominal pain, diarrhea, nausea and vomiting.  Genitourinary:  Negative for difficulty urinating and dysuria.  Musculoskeletal:  Negative for joint swelling and myalgias.  Skin:  Negative for color change and rash.  Neurological:  Negative for dizziness, light-headedness and headaches.  Psychiatric/Behavioral:  Negative for agitation and dysphoric mood.       Objective:     BP 122/72   Pulse 88   Temp 98 F (36.7 C)  Resp 16   Ht 6' 1"  (1.854 m)   Wt 291 lb 3.2 oz (132.1 kg)   SpO2 99%   BMI 38.42 kg/m  Wt Readings from Last 3 Encounters:  02/27/21 291 lb 3.2 oz (132.1 kg)  05/19/20 290 lb (131.5 kg)  03/06/20 286 lb 4 oz (129.8 kg)    Physical Exam Constitutional:      General: He is not in acute distress.    Appearance: Normal appearance. He is well-developed.  HENT:     Head: Normocephalic and atraumatic.     Right Ear: External ear normal.     Left Ear: External ear normal.  Eyes:     General: No scleral icterus.       Right eye: No discharge.        Left eye: No discharge.  Cardiovascular:     Rate and Rhythm: Normal rate and regular rhythm.  Pulmonary:     Effort: Pulmonary effort is normal. No respiratory distress.     Breath sounds: Normal breath sounds.  Abdominal:     General: Bowel sounds are  normal.     Palpations: Abdomen is soft.     Tenderness: There is no abdominal tenderness.  Musculoskeletal:        General: No swelling or tenderness.     Cervical back: Neck supple. No tenderness.  Lymphadenopathy:     Cervical: No cervical adenopathy.  Skin:    Findings: No erythema or rash.  Neurological:     Mental Status: He is alert.  Psychiatric:        Mood and Affect: Mood normal.        Behavior: Behavior normal.     Outpatient Encounter Medications as of 02/27/2021  Medication Sig   amLODipine (NORVASC) 10 MG tablet TAKE 1 TABLET(10 MG) BY MOUTH DAILY   finasteride (PROSCAR) 5 MG tablet TAKE 1 TABLET(5 MG) BY MOUTH DAILY   furosemide (LASIX) 20 MG tablet TAKE 1 TABLET BY MOUTH TWICE DAILY AS NEEDED   lisinopril (ZESTRIL) 20 MG tablet TAKE 1 TABLET(20 MG) BY MOUTH DAILY   lovastatin (MEVACOR) 20 MG tablet TAKE 1 TABLET(20 MG) BY MOUTH AT BEDTIME   metoprolol tartrate (LOPRESSOR) 50 MG tablet Take 1 tablet (50 mg total) by mouth 2 (two) times daily.   warfarin (COUMADIN) 2 MG tablet TAKE 1 TO 2 TABLETS BY MOUTH DAILY AS DIRECTED BY COUMADIN CLINIC   warfarin (COUMADIN) 5 MG tablet TAKE AS DIRECTED BY ANTI-COAG CLINIC   No facility-administered encounter medications on file as of 02/27/2021.     Lab Results  Component Value Date   WBC 9.5 11/17/2019   HGB 16.4 11/17/2019   HCT 48.1 11/17/2019   PLT 175.0 11/17/2019   GLUCOSE 96 05/19/2020   CHOL 114 05/19/2020   TRIG 138.0 05/19/2020   HDL 33.50 (L) 05/19/2020   LDLDIRECT 77.0 04/06/2018   LDLCALC 53 05/19/2020   ALT 25 05/19/2020   AST 24 05/19/2020   NA 142 05/19/2020   K 4.1 05/19/2020   CL 104 05/19/2020   CREATININE 0.94 05/19/2020   BUN 12 05/19/2020   CO2 29 05/19/2020   TSH 2.24 11/17/2019   PSA 0.53 05/19/2020   INR 2.2 01/24/2021   HGBA1C 5.5 05/19/2020    US Venous Img Lower Unilateral Right  Result Date: 10/12/2017 CLINICAL DATA:  Right lower extremity pain, edema and cellulitis. History  of prior great saphenous vein stripping. EXAM: RIGHT LOWER EXTREMITY VENOUS DOPPLER ULTRASOUND TECHNIQUE: Gray-scale sonography  with graded compression, as well as color Doppler and duplex ultrasound were performed to evaluate the lower extremity deep venous systems from the level of the common femoral vein and including the common femoral, femoral, profunda femoral, popliteal and calf veins including the posterior tibial, peroneal and gastrocnemius veins when visible. The superficial great saphenous vein was also interrogated. Spectral Doppler was utilized to evaluate flow at rest and with distal augmentation maneuvers in the common femoral, femoral and popliteal veins. COMPARISON:  None. FINDINGS: Contralateral Common Femoral Vein: Respiratory phasicity is normal and symmetric with the symptomatic side. No evidence of thrombus. Normal compressibility. Common Femoral Vein: No evidence of thrombus. Normal compressibility, respiratory phasicity and response to augmentation. Saphenofemoral Junction: No evidence of thrombus. Normal compressibility and flow on color Doppler imaging. Profunda Femoral Vein: No evidence of thrombus. Normal compressibility and flow on color Doppler imaging. Femoral Vein: No evidence of thrombus. Normal compressibility, respiratory phasicity and response to augmentation. Popliteal Vein: No evidence of thrombus. Normal compressibility, respiratory phasicity and response to augmentation. Calf Veins: No evidence of thrombus. Normal compressibility and flow on color Doppler imaging. Superficial Great Saphenous Vein: Not visualized. Venous Reflux:  None. Other Findings: No evidence of superficial thrombophlebitis or abnormal fluid collection. IMPRESSION: No evidence of right lower extremity deep venous thrombosis. Electronically Signed   By: Aletta Edouard M.D.   On: 10/12/2017 09:38       Assessment & Plan:   Problem List Items Addressed This Visit     Apnea, sleep    Uses cpap  regularly.        Atrial fibrillation ([place])    Followed by cardiology. On coumadin.       Elevated hemoglobin ([place])    Being treated for OSA. Using cpap regularly.  Follow cbc.       Essential hypertension    On metoprolol, amlodipine and lisinopril. Spot check pressure.  Continue current medication.  Follow met b.       Relevant Orders   CBC with Differential/Platelet   Basic metabolic panel   TSH   Hypercholesterolemia - Primary    On lovastatin.  Low cholesterol diet and exercise.  Follow lipid panel and liver function tests.       Relevant Orders   Hepatic function panel   Lipid panel   Hyperglycemia    Low carb diet and exercise.  Follow met b and a1c.       Relevant Orders   Hemoglobin A1c   Pulmonary hypertension ([place])    Has been evaluated by cardiology.  Using cpap.  Breathing stable.       Severe obesity (BMI 35.0-35.9 with comorbidity) ([facility])    Diet, exercise.  Follow.       Other Visit Diagnoses     Need for immunization against influenza       Relevant Orders   Flu Vaccine QUAD High Dose(Fluad) (Completed)        Einar Pheasant, MD

## 2021-03-04 ENCOUNTER — Encounter: Payer: Self-pay | Admitting: Internal Medicine

## 2021-03-04 ENCOUNTER — Other Ambulatory Visit: Payer: Self-pay | Admitting: Cardiovascular Disease

## 2021-03-04 DIAGNOSIS — Z6835 Body mass index (BMI) 35.0-35.9, adult: Secondary | ICD-10-CM | POA: Insufficient documentation

## 2021-03-04 NOTE — Assessment & Plan Note (Signed)
Low carb diet and exercise.  Follow met b and a1c.  

## 2021-03-04 NOTE — Assessment & Plan Note (Signed)
On lovastatin.  Low cholesterol diet and exercise.  Follow lipid panel and liver function tests.   

## 2021-03-04 NOTE — Assessment & Plan Note (Signed)
Being treated for OSA. Using cpap regularly.  Follow cbc.  

## 2021-03-04 NOTE — Assessment & Plan Note (Signed)
Has been evaluated by cardiology.  Using cpap.  Breathing stable.  

## 2021-03-04 NOTE — Assessment & Plan Note (Signed)
Uses cpap regularly.   

## 2021-03-04 NOTE — Assessment & Plan Note (Signed)
Diet, exercise.  Follow.

## 2021-03-04 NOTE — Assessment & Plan Note (Signed)
On metoprolol, amlodipine and lisinopril. Spot check pressure.  Continue current medication.  Follow met b.

## 2021-03-04 NOTE — Assessment & Plan Note (Signed)
Followed by cardiology. On coumadin.

## 2021-03-05 NOTE — Telephone Encounter (Signed)
Refill request

## 2021-03-07 ENCOUNTER — Ambulatory Visit (INDEPENDENT_AMBULATORY_CARE_PROVIDER_SITE_OTHER): Payer: Medicare Other

## 2021-03-07 ENCOUNTER — Other Ambulatory Visit: Payer: Self-pay

## 2021-03-07 DIAGNOSIS — I482 Chronic atrial fibrillation, unspecified: Secondary | ICD-10-CM

## 2021-03-07 DIAGNOSIS — Z5181 Encounter for therapeutic drug level monitoring: Secondary | ICD-10-CM | POA: Diagnosis not present

## 2021-03-07 LAB — POCT INR: INR: 2.1 (ref 2.0–3.0)

## 2021-03-07 NOTE — Patient Instructions (Signed)
-   continue dosage of warfarin 5 mg every day EXCEPT 7 mg on [place]. - recheck in 6 weeks.

## 2021-03-07 NOTE — Progress Notes (Signed)
Cardiology Office Note  Date:  03/09/2021   ID:  Michael Blevins, DOB 1951-04-06, MRN [id number]  PCP:  Einar Pheasant, MD   Chief Complaint  Patient presents with   12 month follow up     "Doing well." Medications reviewed by the patient verbally.     HPI:  Michael Blevins is a 42 history-year-old gentleman with history of  Remote history of smoking for 17 years Obesity, atrial fibrillation, permanent cardioversion in 2011, did not hold, on chronic anticoagulation ,  sleep apnea on CPAP,   Essential hypertension,  Hyperlipidemia,  Small atrial septal defect ( QP/QS 1.08 ),  Venous insufficiency,  prior cardiac catheterization in 2011 by report showing no significant coronary disease,  who presents for follow-up of his permanent atrial fibrillation, ASD, hyperlipidemia  On warfarin  INR well controlled  R>L leg swelling, chronic Prior cellulitis RLE likely contributed to chronic swelling Wears compressions On amlodipine (does not want to change at this time) Blood pressure well controlled at home  Rare "fluttering", does not feel that heart rate is going fast  Lasix 40 in AM Denies shortness of breath on exertion Stable renal function  No chest pain concerning for angina Total cholesterol well controlled less than 120  EKG personally reviewed by myself on todays visit, no change Shows Atrial fibrillation with ventricular rate 75 bpm  Other past medical history reviewed Cellulitis in 10/2017 after sitting in a car driving to [place]  Previously seen at [facility],  stress echocardiogram 06/14/2016 performed for shortness of breath, exercised 6 minutes on a Bruce protocol without chest pain or ECG changes. Echo showed no wall motion abnormality concerning for ischemia  echocardiogram revealed normal left ventricular function, with LV ejection fraction greater than 55%. My review of the study showed details of moderate TR, mild to moderately elevated right heart  pressures, no mention of his left atrial or right atrial size, no mention of atrial septal defect  chronic mild lower extremity edema, nonpitting , per the notes felt to be secondary to venous insufficiency   negative cardiac cath 2011   Sleep apnea   PMH :   has a past medical history of Allergy, Asthma, Atrial fibrillation ([place]), Back pain, Cellulitis, Gout, History of chicken pox, Hyperlipidemia, Hypertension, Kidney stone, Sleep apnea, and Venous insufficiency.  PSH:    Past Surgical History:  Procedure Laterality Date   APPENDECTOMY     CARDIAC CATHETERIZATION      Current Outpatient Medications  Medication Sig Dispense Refill   amLODipine (NORVASC) 10 MG tablet TAKE 1 TABLET(10 MG) BY MOUTH DAILY 90 tablet 3   finasteride (PROSCAR) 5 MG tablet TAKE 1 TABLET(5 MG) BY MOUTH DAILY 90 tablet 1   furosemide (LASIX) 20 MG tablet TAKE 1 TABLET BY MOUTH TWICE DAILY AS NEEDED 180 tablet 3   lisinopril (ZESTRIL) 20 MG tablet TAKE 1 TABLET(20 MG) BY MOUTH DAILY 90 tablet 3   lovastatin (MEVACOR) 20 MG tablet TAKE 1 TABLET(20 MG) BY MOUTH AT BEDTIME 90 tablet 3   metoprolol tartrate (LOPRESSOR) 50 MG tablet Take 1 tablet (50 mg total) by mouth 2 (two) times daily. 180 tablet 0   warfarin (COUMADIN) 2 MG tablet TAKE 1 TO 2 TABLETS BY MOUTH DAILY AS DIRECTED BY COUMADIN CLINIC 20 tablet 0   warfarin (COUMADIN) 5 MG tablet TAKE BY MOUTH AS DIRECTED BY ANTI-COAGULATION CLINIC 135 tablet 1   No current facility-administered medications for this visit.     Allergies:   Patient  has no known allergies.   Social History:  The patient  reports that he has quit smoking. He has never used smokeless tobacco. He reports that he does not drink alcohol and does not use drugs.   Family History:   family history includes Diabetes in his father; Hypertension in his mother.    Review of Systems: Review of Systems  Constitutional: Negative.   Respiratory: Negative.    Cardiovascular:  Positive for leg  swelling.  Gastrointestinal: Negative.   Musculoskeletal: Negative.   Neurological: Negative.   Psychiatric/Behavioral: Negative.    All other systems reviewed and are negative.  PHYSICAL EXAM: VS:  BP 140/80 (BP Location: Left Arm, Patient Position: Sitting, Cuff Size: Large)   Pulse 75   Ht 6\' 1"  (1.854 m)   Wt 291 lb (132 kg)   SpO2 99%   BMI 38.39 kg/m  , BMI Body mass index is 38.39 kg/m. Constitutional:  oriented to person, place, and time. No distress.  HENT:  Head: Grossly normal Eyes:  no discharge. No scleral icterus.  Neck: No JVD, no carotid bruits  Cardiovascular: Irregularly irregular no murmurs appreciated Pulmonary/Chest: Clear to auscultation bilaterally, no wheezes or rails Abdominal: Soft.  no distension.  no tenderness.  Musculoskeletal: Normal range of motion Neurological:  normal muscle tone. Coordination normal. No atrophy Skin: Skin warm and dry Psychiatric: normal affect, pleasant   Recent Labs: 05/19/2020: ALT 25; BUN 12; Creatinine, Ser 0.94; Potassium 4.1; Sodium 142    Lipid Panel Lab Results  Component Value Date   CHOL 114 05/19/2020   HDL 33.50 (L) 05/19/2020   LDLCALC 53 05/19/2020   TRIG 138.0 05/19/2020      Wt Readings from Last 3 Encounters:  03/09/21 291 lb (132 kg)  02/27/21 291 lb 3.2 oz (132.1 kg)  05/19/20 290 lb (131.5 kg)     ASSESSMENT AND PLAN: Chronic atrial fibrillation (HCC) -  Tolerating anticoagulation, rate well controlled CHADS VASC 4 Continue current medications metoprolol 50 twice daily  Essential hypertension - Blood pressure is well controlled on today's visit. No changes made to the medications.  Hypercholesterolemia Cholesterol is at goal on the current lipid regimen. No changes to the medications were made.  Venous insufficiency Prior history of vein surgery May 2019 with cellulitis Wears compression hose Potentially could stop amlodipine if symptoms get worse, he has declined change at this  time  Encounter for anticoagulation discussion and counseling Tolerating warfarin, does not want to change  Discussed other medications going generic in the next year, Pradaxa  Obstructive sleep apnea On CPAP, compliant  Pulmonary hypertension  Previously with elevated right heart pressures seen on echocardiogram outside our office . Pressure 45  mmHg  On Lasix 40 daily, stable breathing, leg swelling  Non-rheumatic tricuspid valve insufficiency moderate TR per the previous echo,  Previously declined echo No significant murmur on exam  Morbid obesity ([place]) We have encouraged continued exercise, careful diet management in an effort to lose weight.recommended low carbohydrate diet  Atrial septal defect  no  mention of atrial septal defect on limited echocardiogram done at outside office.  Previously declined echo   Total encounter time more than 25 minutes  Greater than 50% was spent in counseling and coordination of care with the patient     Orders Placed This Encounter  Procedures   EKG 12-Lead     Signed, Esmond Plants, M.D., Ph.D. 03/09/2021  [place], [place]

## 2021-03-09 ENCOUNTER — Encounter: Payer: Self-pay | Admitting: Cardiovascular Disease

## 2021-03-09 ENCOUNTER — Other Ambulatory Visit: Payer: Self-pay

## 2021-03-09 ENCOUNTER — Ambulatory Visit (INDEPENDENT_AMBULATORY_CARE_PROVIDER_SITE_OTHER): Payer: Medicare Other | Admitting: Cardiovascular Disease

## 2021-03-09 VITALS — BP 140/80 | HR 75 | Ht 73.0 in | Wt 291.0 lb

## 2021-03-09 DIAGNOSIS — I482 Chronic atrial fibrillation, unspecified: Secondary | ICD-10-CM | POA: Diagnosis not present

## 2021-03-09 DIAGNOSIS — I10 Essential (primary) hypertension: Secondary | ICD-10-CM | POA: Diagnosis not present

## 2021-03-09 DIAGNOSIS — Q211 Atrial septal defect: Secondary | ICD-10-CM

## 2021-03-09 DIAGNOSIS — I272 Pulmonary hypertension, unspecified: Secondary | ICD-10-CM

## 2021-03-09 DIAGNOSIS — Q2111 Secundum atrial septal defect: Secondary | ICD-10-CM

## 2021-03-09 NOTE — Patient Instructions (Signed)
Medication Instructions:  No changes  If you need a refill on your cardiac medications before your next appointment, please call your pharmacy.    Lab work: No new labs needed   If you have labs (blood work) drawn today and your tests are completely normal, you will receive your results only by: MyChart Message (if you have MyChart) OR A paper copy in the mail If you have any lab test that is abnormal or we need to change your treatment, we will call you to review the results.   Testing/Procedures: No new testing needed   Follow-Up: At [facility], you and your health needs are our priority.  As part of our continuing mission to provide you with exceptional heart care, we have created designated Provider Care Teams.  These Care Teams include your primary Cardiologist (physician) and Advanced Practice Providers (APPs -  Physician Assistants and Nurse Practitioners) who all work together to provide you with the care you need, when you need it.  You will need a follow up appointment in 12 months  Providers on your designated Care Team:   Christopher Berge, NP Ryan Dunn, PA-C Jacquelyn Visser, PA-C Cadence Furth, PA-C  Any Other Special Instructions Will Be Listed Below (If Applicable).  COVID-19 Vaccine Information can be found at: [email or web address] For questions related to vaccine distribution or appointments, please email [email or web address] or call [phone].    

## 2021-03-14 ENCOUNTER — Other Ambulatory Visit (INDEPENDENT_AMBULATORY_CARE_PROVIDER_SITE_OTHER): Payer: Medicare Other

## 2021-03-14 ENCOUNTER — Other Ambulatory Visit: Payer: Self-pay

## 2021-03-14 DIAGNOSIS — E7800 Pure hypercholesterolemia, unspecified: Secondary | ICD-10-CM

## 2021-03-14 DIAGNOSIS — R739 Hyperglycemia, unspecified: Secondary | ICD-10-CM

## 2021-03-14 DIAGNOSIS — I10 Essential (primary) hypertension: Secondary | ICD-10-CM | POA: Diagnosis not present

## 2021-03-14 LAB — HEPATIC FUNCTION PANEL
ALT: 20 U/L (ref 0–53)
AST: 23 U/L (ref 0–37)
Albumin: 4 g/dL (ref 3.5–5.2)
Alkaline Phosphatase: 58 U/L (ref 39–117)
Bilirubin, Direct: 0.3 mg/dL (ref 0.0–0.3)
Total Bilirubin: 1.1 mg/dL (ref 0.2–1.2)
Total Protein: 6.3 g/dL (ref 6.0–8.3)

## 2021-03-14 LAB — CBC WITH DIFFERENTIAL/PLATELET
Basophils Absolute: 0.1 10*3/uL (ref 0.0–0.1)
Basophils Relative: 0.9 % (ref 0.0–3.0)
Eosinophils Absolute: 0.2 10*3/uL (ref 0.0–0.7)
Eosinophils Relative: 2.6 % (ref 0.0–5.0)
HCT: 46 % (ref 39.0–52.0)
Hemoglobin: 15.5 g/dL (ref 13.0–17.0)
Lymphocytes Relative: 23.9 % (ref 12.0–46.0)
Lymphs Abs: 2.1 10*3/uL (ref 0.7–4.0)
MCHC: 33.6 g/dL (ref 30.0–36.0)
MCV: 88.9 fl (ref 78.0–100.0)
Monocytes Absolute: 0.8 10*3/uL (ref 0.1–1.0)
Monocytes Relative: 8.7 % (ref 3.0–12.0)
Neutro Abs: 5.5 10*3/uL (ref 1.4–7.7)
Neutrophils Relative %: 63.9 % (ref 43.0–77.0)
Platelets: 176 10*3/uL (ref 150.0–400.0)
RBC: 5.18 Mil/uL (ref 4.22–5.81)
RDW: 13.2 % (ref 11.5–15.5)
WBC: 8.7 10*3/uL (ref 4.0–10.5)

## 2021-03-14 LAB — LIPID PANEL
Cholesterol: 107 mg/dL (ref 0–200)
HDL: 34.9 mg/dL — ABNORMAL LOW (ref >39.00)
LDL Cholesterol: 49 mg/dL (ref 0–99)
NonHDL: 71.89
Total CHOL/HDL Ratio: 3
Triglycerides: 113 mg/dL (ref 0.0–149.0)
VLDL: 22.6 mg/dL (ref 0.0–40.0)

## 2021-03-14 LAB — BASIC METABOLIC PANEL
BUN: 14 mg/dL (ref 6–23)
CO2: 31 mEq/L (ref 19–32)
Calcium: 9.2 mg/dL (ref 8.4–10.5)
Chloride: 106 mEq/L (ref 96–112)
Creatinine, Ser: 0.99 mg/dL (ref 0.40–1.50)
GFR: 76.98 mL/min (ref >60.00)
Glucose, Bld: 96 mg/dL (ref 70–99)
Potassium: 4.1 mEq/L (ref 3.5–5.1)
Sodium: 143 mEq/L (ref 135–145)

## 2021-03-14 LAB — TSH: TSH: 3.46 u[IU]/mL (ref 0.35–5.50)

## 2021-03-14 LAB — HEMOGLOBIN A1C: Hgb A1c MFr Bld: 5.4 % (ref 4.6–6.5)

## 2021-04-23 ENCOUNTER — Other Ambulatory Visit: Payer: Self-pay

## 2021-04-23 MED ORDER — METOPROLOL TARTRATE 50 MG PO TABS: 50.0000 mg | ORAL_TABLET | Freq: Two times a day (BID) | ORAL | 0 refills | Status: DC

## 2021-04-23 NOTE — Telephone Encounter (Signed)
*  STAT* If patient is at the pharmacy, call can be transferred to refill team.   1. Which medications need to be refilled? (please list name of each medication and dose if known) Metoprolol  2. Which pharmacy/location (including street and city if local pharmacy) is medication to be sent to? [facility]  3. Do they need a 30 day or 90 day supply? [place]

## 2021-04-25 ENCOUNTER — Ambulatory Visit (INDEPENDENT_AMBULATORY_CARE_PROVIDER_SITE_OTHER): Payer: Medicare Other

## 2021-04-25 ENCOUNTER — Other Ambulatory Visit: Payer: Self-pay

## 2021-04-25 DIAGNOSIS — I482 Chronic atrial fibrillation, unspecified: Secondary | ICD-10-CM | POA: Diagnosis not present

## 2021-04-25 DIAGNOSIS — Z5181 Encounter for therapeutic drug level monitoring: Secondary | ICD-10-CM

## 2021-04-25 LAB — POCT INR: INR: 1.7 — AB (ref 2.0–3.0)

## 2021-04-25 NOTE — Patient Instructions (Signed)
-   take extra 1/2 tablet warfarin tonight, then - continue dosage of warfarin 5 mg every day EXCEPT 7 mg on MONDAYS & FRIDAYS. - recheck in 6 weeks.

## 2021-05-16 DIAGNOSIS — M10071 Idiopathic gout, right ankle and foot: Secondary | ICD-10-CM | POA: Diagnosis not present

## 2021-05-16 DIAGNOSIS — Z7901 Long term (current) use of anticoagulants: Secondary | ICD-10-CM | POA: Diagnosis not present

## 2021-06-06 ENCOUNTER — Other Ambulatory Visit: Payer: Self-pay

## 2021-06-06 ENCOUNTER — Ambulatory Visit (INDEPENDENT_AMBULATORY_CARE_PROVIDER_SITE_OTHER): Payer: Medicare Other

## 2021-06-06 DIAGNOSIS — Z5181 Encounter for therapeutic drug level monitoring: Secondary | ICD-10-CM

## 2021-06-06 DIAGNOSIS — I482 Chronic atrial fibrillation, unspecified: Secondary | ICD-10-CM

## 2021-06-06 LAB — POCT INR: INR: 2.4 (ref 2.0–3.0)

## 2021-06-06 NOTE — Patient Instructions (Signed)
-   continue dosage of warfarin 5 mg every day EXCEPT 7 mg on [place]. - recheck in 6 weeks.

## 2021-06-16 ENCOUNTER — Other Ambulatory Visit: Payer: Self-pay | Admitting: Internal Medicine

## 2021-06-16 ENCOUNTER — Other Ambulatory Visit: Payer: Self-pay | Admitting: Cardiovascular Disease

## 2021-06-18 DIAGNOSIS — L609 Nail disorder, unspecified: Secondary | ICD-10-CM | POA: Diagnosis not present

## 2021-06-18 DIAGNOSIS — D2361 Other benign neoplasm of skin of right upper limb, including shoulder: Secondary | ICD-10-CM | POA: Diagnosis not present

## 2021-06-29 ENCOUNTER — Ambulatory Visit: Payer: Medicare Other | Admitting: Internal Medicine

## 2021-07-04 DIAGNOSIS — B9689 Other specified bacterial agents as the cause of diseases classified elsewhere: Secondary | ICD-10-CM | POA: Diagnosis not present

## 2021-07-04 DIAGNOSIS — J209 Acute bronchitis, unspecified: Secondary | ICD-10-CM | POA: Diagnosis not present

## 2021-07-04 DIAGNOSIS — J019 Acute sinusitis, unspecified: Secondary | ICD-10-CM | POA: Diagnosis not present

## 2021-07-04 DIAGNOSIS — Z03818 Encounter for observation for suspected exposure to other biological agents ruled out: Secondary | ICD-10-CM | POA: Diagnosis not present

## 2021-07-04 DIAGNOSIS — Z7901 Long term (current) use of anticoagulants: Secondary | ICD-10-CM | POA: Diagnosis not present

## 2021-07-18 ENCOUNTER — Ambulatory Visit (INDEPENDENT_AMBULATORY_CARE_PROVIDER_SITE_OTHER): Payer: Medicare Other

## 2021-07-18 ENCOUNTER — Other Ambulatory Visit: Payer: Self-pay

## 2021-07-18 DIAGNOSIS — Z5181 Encounter for therapeutic drug level monitoring: Secondary | ICD-10-CM

## 2021-07-18 DIAGNOSIS — I482 Chronic atrial fibrillation, unspecified: Secondary | ICD-10-CM

## 2021-07-18 LAB — POCT INR: INR: 3.9 — AB (ref 2.0–3.0)

## 2021-07-18 NOTE — Patient Instructions (Signed)
-   have a serving of greens today, then - skip warfarin tonight, then  - continue dosage of warfarin 5 mg every day EXCEPT 7 mg on MONDAYS & FRIDAYS. - recheck in 6 weeks.  If you start any new meds, such as antibiotics or steroids, please let us know so we can adjust your warfarin [phone]

## 2021-07-27 ENCOUNTER — Other Ambulatory Visit: Payer: Self-pay | Admitting: Cardiovascular Disease

## 2021-07-27 NOTE — Telephone Encounter (Signed)
*  STAT* If patient is at the pharmacy, call can be transferred to refill team.   1. Which medications need to be refilled? (please list name of each medication and dose if known) Metoprolol  2. Which pharmacy/location (including street and city if local pharmacy) is medication to be sent to? [facility]  3. Do they need a 30 day or 90 day supply? [place]

## 2021-08-01 DIAGNOSIS — M10071 Idiopathic gout, right ankle and foot: Secondary | ICD-10-CM | POA: Diagnosis not present

## 2021-08-29 ENCOUNTER — Other Ambulatory Visit: Payer: Self-pay

## 2021-08-29 ENCOUNTER — Ambulatory Visit (INDEPENDENT_AMBULATORY_CARE_PROVIDER_SITE_OTHER): Payer: Medicare Other

## 2021-08-29 DIAGNOSIS — I482 Chronic atrial fibrillation, unspecified: Secondary | ICD-10-CM

## 2021-08-29 DIAGNOSIS — Z5181 Encounter for therapeutic drug level monitoring: Secondary | ICD-10-CM

## 2021-08-29 LAB — POCT INR: INR: 2 (ref 2.0–3.0)

## 2021-08-29 NOTE — Patient Instructions (Signed)
-   continue dosage of warfarin 5 mg every day EXCEPT 7 mg on [place]. ?- recheck in 6 weeks. ?

## 2021-09-04 ENCOUNTER — Other Ambulatory Visit: Payer: Self-pay | Admitting: Cardiovascular Disease

## 2021-09-04 NOTE — Telephone Encounter (Signed)
Request for warfarin refill: ? ?Last INR was 2.0 on 08/29/21 ?Next INR on 10/10/21 ?LOV was 03/09/21  Johnny Bridge MD ?Next OV in 1 year ?Refill approved ? ?

## 2021-09-04 NOTE — Telephone Encounter (Signed)
Please review

## 2021-09-12 ENCOUNTER — Ambulatory Visit (INDEPENDENT_AMBULATORY_CARE_PROVIDER_SITE_OTHER): Payer: Medicare Other | Admitting: Internal Medicine

## 2021-09-12 VITALS — BP 126/74 | HR 75 | Temp 97.9°F | Resp 16 | Ht 73.0 in | Wt 295.2 lb

## 2021-09-12 DIAGNOSIS — Z6835 Body mass index (BMI) 35.0-35.9, adult: Secondary | ICD-10-CM | POA: Diagnosis not present

## 2021-09-12 DIAGNOSIS — I4891 Unspecified atrial fibrillation: Secondary | ICD-10-CM

## 2021-09-12 DIAGNOSIS — D582 Other hemoglobinopathies: Secondary | ICD-10-CM | POA: Diagnosis not present

## 2021-09-12 DIAGNOSIS — Z125 Encounter for screening for malignant neoplasm of prostate: Secondary | ICD-10-CM

## 2021-09-12 DIAGNOSIS — I10 Essential (primary) hypertension: Secondary | ICD-10-CM

## 2021-09-12 DIAGNOSIS — D6869 Other thrombophilia: Secondary | ICD-10-CM

## 2021-09-12 DIAGNOSIS — E7800 Pure hypercholesterolemia, unspecified: Secondary | ICD-10-CM

## 2021-09-12 DIAGNOSIS — I272 Pulmonary hypertension, unspecified: Secondary | ICD-10-CM

## 2021-09-12 DIAGNOSIS — R739 Hyperglycemia, unspecified: Secondary | ICD-10-CM | POA: Diagnosis not present

## 2021-09-12 DIAGNOSIS — G4733 Obstructive sleep apnea (adult) (pediatric): Secondary | ICD-10-CM | POA: Diagnosis not present

## 2021-09-12 DIAGNOSIS — I872 Venous insufficiency (chronic) (peripheral): Secondary | ICD-10-CM | POA: Diagnosis not present

## 2021-09-12 NOTE — Progress Notes (Signed)
Patient ID: Michael Blevins, male   DOB: 08/16/1950, 71 y.o.   MRN: [id number] ? ? ?Subjective:  ? ? Patient ID: Michael Blevins, male    DOB: 1950-08-24, 71 y.o.   MRN: [id number] ? ?This visit occurred during the SARS-CoV-2 public health emergency.  Safety protocols were in place, including screening questions prior to the visit, additional usage of staff PPE, and extensive cleaning of exam room while observing appropriate contact time as indicated for disinfecting solutions.  ? ?Patient here for a scheduled follow up.  ? ?Chief Complaint  ?Patient presents with  ? Gout  ?  Follow up from UC  ? Hypertension  ? .  ? ?HPI ?Had two recent gout flares.  Treated with prednisone.  Discussed gout.  Discussed lasix.  Discussed allopurinol.  He has started celery seed capsules and has increased his water intake.  Wants to try these measures first before starting new medication. Desires no further intervention at this time.  Wants to see if this works.  No chest pain.  Breathing stable.  No increased lower extremity swelling.  Does take his lasix bid.  No acid reflux reported.  No abdominal pain.  Bowels moving.  Occasional tinnitus.  Discussed hearing evaluation.   ? ? ?Past Medical History:  ?Diagnosis Date  ? Allergy   ? cats and pollen  ? Asthma   ? Atrial fibrillation ([place])   ? Back pain   ? Cellulitis   ? Gout   ? History of chicken pox   ? Hyperlipidemia   ? Hypertension   ? Kidney stone   ? Sleep apnea   ? Venous insufficiency   ? ?Past Surgical History:  ?Procedure Laterality Date  ? APPENDECTOMY    ? CARDIAC CATHETERIZATION    ? ?Family History  ?Problem Relation Age of Onset  ? Hypertension Mother   ? Diabetes Father   ? Kidney disease Neg Hx   ? Prostate cancer Neg Hx   ? ?Social History  ? ?Socioeconomic History  ? Marital status: Married  ?  Spouse name: Not on file  ? Number of children: Not on file  ? Years of education: Not on file  ? Highest education level: Not on file  ?Occupational History  ?  Occupation: retired  ?Tobacco Use  ? Smoking status: Former  ? Smokeless tobacco: Never  ? Tobacco comments:  ?  quit 33 years  ?Vaping Use  ? Vaping Use: Never used  ?Substance and Sexual Activity  ? Alcohol use: No  ?  Alcohol/week: 0.0 standard drinks  ? Drug use: No  ? Sexual activity: Not on file  ?Other Topics Concern  ? Not on file  ?Social History Narrative  ? Not on file  ? ?Social Determinants of Health  ? ?Financial Resource Strain: Not on file  ?Food Insecurity: Not on file  ?Transportation Needs: Not on file  ?Physical Activity: Not on file  ?Stress: Not on file  ?Social Connections: Not on file  ? ? ? ?Review of Systems  ?Constitutional:  Negative for appetite change and unexpected weight change.  ?HENT:  Positive for tinnitus. Negative for congestion and sinus pressure.   ?Respiratory:  Negative for cough and chest tightness.   ?     Breathing stable.   ?Cardiovascular:  Negative for chest pain and palpitations.  ?     No increased swelling.   ?Gastrointestinal:  Negative for abdominal pain, diarrhea, nausea and vomiting.  ?Genitourinary:  Negative for difficulty  urinating and dysuria.  ?Musculoskeletal:  Negative for joint swelling and myalgias.  ?Skin:  Negative for color change and rash.  ?     Stasis changes - right lower extremity.   ?Neurological:  Negative for dizziness, light-headedness and headaches.  ?Psychiatric/Behavioral:  Negative for agitation and dysphoric mood.   ? ?   ?Objective:  ?  ? ?BP 126/74   Pulse 75   Temp 97.9 ?F (36.6 ?C)   Resp 16   Ht '6\' 1"'$  (1.854 m)   Wt 295 lb 3.2 oz (133.9 kg)   SpO2 99%   BMI 38.95 kg/m?  ?Wt Readings from Last 3 Encounters:  ?09/12/21 295 lb 3.2 oz (133.9 kg)  ?03/09/21 291 lb (132 kg)  ?02/27/21 291 lb 3.2 oz (132.1 kg)  ? ? ?Physical Exam ?Constitutional:   ?   General: He is not in acute distress. ?   Appearance: Normal appearance. He is well-developed.  ?HENT:  ?   Head: Normocephalic and atraumatic.  ?   Right Ear: External ear normal.  ?    Left Ear: External ear normal.  ?Eyes:  ?   General: No scleral icterus.    ?   Right eye: No discharge.     ?   Left eye: No discharge.  ?Cardiovascular:  ?   Rate and Rhythm: Normal rate and regular rhythm.  ?Pulmonary:  ?   Effort: Pulmonary effort is normal. No respiratory distress.  ?   Breath sounds: Normal breath sounds.  ?Abdominal:  ?   General: Bowel sounds are normal.  ?   Palpations: Abdomen is soft.  ?   Tenderness: There is no abdominal tenderness.  ?Musculoskeletal:     ?   General: No tenderness.  ?   Cervical back: Neck supple. No tenderness.  ?   Comments: No increased swelling.  Stable.    ?Lymphadenopathy:  ?   Cervical: No cervical adenopathy.  ?Skin: ?   Findings: No erythema or rash.  ?Neurological:  ?   Mental Status: He is alert.  ?Psychiatric:     ?   Mood and Affect: Mood normal.     ?   Behavior: Behavior normal.  ? ? ? ?Outpatient Encounter Medications as of 09/12/2021  ?Medication Sig  ? amLODipine (NORVASC) 10 MG tablet TAKE 1 TABLET(10 MG) BY MOUTH DAILY  ? finasteride (PROSCAR) 5 MG tablet TAKE 1 TABLET(5 MG) BY MOUTH DAILY  ? furosemide (LASIX) 20 MG tablet TAKE 1 TABLET BY MOUTH TWICE DAILY AS NEEDED  ? lisinopril (ZESTRIL) 20 MG tablet TAKE 1 TABLET(20 MG) BY MOUTH DAILY  ? lovastatin (MEVACOR) 20 MG tablet TAKE 1 TABLET(20 MG) BY MOUTH AT BEDTIME  ? metoprolol tartrate (LOPRESSOR) 50 MG tablet TAKE 1 TABLET(50 MG) BY MOUTH TWICE DAILY  ? warfarin (COUMADIN) 2 MG tablet TAKE 1 TO 2 TABLETS BY MOUTH DAILY AS DIRECTED BY COUMADIN CLINIC  ? warfarin (COUMADIN) 5 MG tablet TAKE BY MOUTH AS DIRECTED BY ANTI-COAGULATION CLINIC.  ? ?No facility-administered encounter medications on file as of 09/12/2021.  ?  ? ?Lab Results  ?Component Value Date  ? WBC 8.7 03/14/2021  ? HGB 15.5 03/14/2021  ? HCT 46.0 03/14/2021  ? PLT 176.0 03/14/2021  ? GLUCOSE 96 03/14/2021  ? CHOL 107 03/14/2021  ? TRIG 113.0 03/14/2021  ? HDL 34.90 (L) 03/14/2021  ? LDLDIRECT 77.0 04/06/2018  ? LDLCALC 49 03/14/2021   ? ALT 20 03/14/2021  ? AST 23 03/14/2021  ? NA 143  03/14/2021  ? K 4.1 03/14/2021  ? CL 106 03/14/2021  ? CREATININE 0.99 03/14/2021  ? BUN 14 03/14/2021  ? CO2 31 03/14/2021  ? TSH 3.46 03/14/2021  ? PSA 0.53 05/19/2020  ? INR 2.0 08/29/2021  ? HGBA1C 5.4 03/14/2021  ? ? ?US Venous Img Lower Unilateral Right ? ?Result Date: 10/12/2017 ?CLINICAL DATA:  Right lower extremity pain, edema and cellulitis. History of prior great saphenous vein stripping. EXAM: RIGHT LOWER EXTREMITY VENOUS DOPPLER ULTRASOUND TECHNIQUE: Gray-scale sonography with graded compression, as well as color Doppler and duplex ultrasound were performed to evaluate the lower extremity deep venous systems from the level of the common femoral vein and including the common femoral, femoral, profunda femoral, popliteal and calf veins including the posterior tibial, peroneal and gastrocnemius veins when visible. The superficial great saphenous vein was also interrogated. Spectral Doppler was utilized to evaluate flow at rest and with distal augmentation maneuvers in the common femoral, femoral and popliteal veins. COMPARISON:  None. FINDINGS: Contralateral Common Femoral Vein: Respiratory phasicity is normal and symmetric with the symptomatic side. No evidence of thrombus. Normal compressibility. Common Femoral Vein: No evidence of thrombus. Normal compressibility, respiratory phasicity and response to augmentation. Saphenofemoral Junction: No evidence of thrombus. Normal compressibility and flow on color Doppler imaging. Profunda Femoral Vein: No evidence of thrombus. Normal compressibility and flow on color Doppler imaging. Femoral Vein: No evidence of thrombus. Normal compressibility, respiratory phasicity and response to augmentation. Popliteal Vein: No evidence of thrombus. Normal compressibility, respiratory phasicity and response to augmentation. Calf Veins: No evidence of thrombus. Normal compressibility and flow on color Doppler imaging.  Superficial Great Saphenous Vein: Not visualized. Venous Reflux:  None. Other Findings: No evidence of superficial thrombophlebitis or abnormal fluid collection. IMPRESSION: No evidence of right lower extremity deep venous th

## 2021-09-16 ENCOUNTER — Encounter: Payer: Self-pay | Admitting: Internal Medicine

## 2021-09-16 DIAGNOSIS — D6869 Other thrombophilia: Secondary | ICD-10-CM | POA: Insufficient documentation

## 2021-09-16 NOTE — Assessment & Plan Note (Signed)
On lovastatin.  Low cholesterol diet and exercise.  Follow lipid panel and liver function tests.   

## 2021-09-16 NOTE — Assessment & Plan Note (Signed)
On metoprolol, amlodipine and lisinopril. Blood pressure as outlined.  Spot check pressure.  Continue current medication.  Follow met b.  ?

## 2021-09-16 NOTE — Assessment & Plan Note (Signed)
Afib.  On coumadin.  

## 2021-09-16 NOTE — Assessment & Plan Note (Signed)
Low carb diet and exercise.  Follow met b and a1c.  ?

## 2021-09-16 NOTE — Assessment & Plan Note (Signed)
Has been evaluated by cardiology.  Using cpap.  Breathing stable.  

## 2021-09-16 NOTE — Assessment & Plan Note (Signed)
Continue compression hose. Swelling improved.  

## 2021-09-16 NOTE — Assessment & Plan Note (Signed)
Being treated for OSA. Using cpap regularly.  Follow cbc.  

## 2021-09-16 NOTE — Assessment & Plan Note (Signed)
Diet, exercise.  Follow.  ?

## 2021-09-16 NOTE — Assessment & Plan Note (Signed)
Uses cpap regularly.   

## 2021-09-16 NOTE — Assessment & Plan Note (Signed)
Followed by cardiology. On coumadin. Discussed coumadin vs starting DOAC. Elects to remain on coumadin now.  Follow. Stable.  ?

## 2021-09-18 DIAGNOSIS — H40003 Preglaucoma, unspecified, bilateral: Secondary | ICD-10-CM | POA: Diagnosis not present

## 2021-09-18 DIAGNOSIS — H2513 Age-related nuclear cataract, bilateral: Secondary | ICD-10-CM | POA: Diagnosis not present

## 2021-09-22 DIAGNOSIS — J40 Bronchitis, not specified as acute or chronic: Secondary | ICD-10-CM | POA: Diagnosis not present

## 2021-09-22 DIAGNOSIS — R059 Cough, unspecified: Secondary | ICD-10-CM | POA: Diagnosis not present

## 2021-09-24 ENCOUNTER — Encounter: Payer: Self-pay | Admitting: Cardiovascular Disease

## 2021-09-28 ENCOUNTER — Other Ambulatory Visit (INDEPENDENT_AMBULATORY_CARE_PROVIDER_SITE_OTHER): Payer: Medicare Other

## 2021-09-28 DIAGNOSIS — I10 Essential (primary) hypertension: Secondary | ICD-10-CM

## 2021-09-28 DIAGNOSIS — Z125 Encounter for screening for malignant neoplasm of prostate: Secondary | ICD-10-CM

## 2021-09-28 DIAGNOSIS — E7800 Pure hypercholesterolemia, unspecified: Secondary | ICD-10-CM | POA: Diagnosis not present

## 2021-09-28 LAB — BASIC METABOLIC PANEL
BUN: 15 mg/dL (ref 6–23)
CO2: 29 mEq/L (ref 19–32)
Calcium: 9 mg/dL (ref 8.4–10.5)
Chloride: 104 mEq/L (ref 96–112)
Creatinine, Ser: 0.81 mg/dL (ref 0.40–1.50)
GFR: 88.75 mL/min (ref >60.00)
Glucose, Bld: 102 mg/dL — ABNORMAL HIGH (ref 70–99)
Potassium: 3.9 mEq/L (ref 3.5–5.1)
Sodium: 142 mEq/L (ref 135–145)

## 2021-09-28 LAB — HEPATIC FUNCTION PANEL
ALT: 45 U/L (ref 0–53)
AST: 30 U/L (ref 0–37)
Albumin: 4.1 g/dL (ref 3.5–5.2)
Alkaline Phosphatase: 57 U/L (ref 39–117)
Bilirubin, Direct: 0.3 mg/dL (ref 0.0–0.3)
Total Bilirubin: 1 mg/dL (ref 0.2–1.2)
Total Protein: 6.4 g/dL (ref 6.0–8.3)

## 2021-09-28 LAB — LIPID PANEL
Cholesterol: 115 mg/dL (ref 0–200)
HDL: 39.4 mg/dL (ref >39.00)
LDL Cholesterol: 59 mg/dL (ref 0–99)
NonHDL: 75.79
Total CHOL/HDL Ratio: 3
Triglycerides: 85 mg/dL (ref 0.0–149.0)
VLDL: 17 mg/dL (ref 0.0–40.0)

## 2021-09-28 LAB — PSA, MEDICARE: PSA: 0.47 ng/ml (ref 0.10–4.00)

## 2021-10-04 DIAGNOSIS — J40 Bronchitis, not specified as acute or chronic: Secondary | ICD-10-CM | POA: Diagnosis not present

## 2021-10-04 DIAGNOSIS — R059 Cough, unspecified: Secondary | ICD-10-CM | POA: Diagnosis not present

## 2021-10-05 ENCOUNTER — Encounter: Payer: Self-pay | Admitting: Cardiovascular Disease

## 2021-10-10 ENCOUNTER — Ambulatory Visit (INDEPENDENT_AMBULATORY_CARE_PROVIDER_SITE_OTHER): Payer: Medicare Other

## 2021-10-10 ENCOUNTER — Encounter: Payer: Self-pay | Admitting: Cardiovascular Disease

## 2021-10-10 DIAGNOSIS — I482 Chronic atrial fibrillation, unspecified: Secondary | ICD-10-CM | POA: Diagnosis not present

## 2021-10-10 DIAGNOSIS — Z5181 Encounter for therapeutic drug level monitoring: Secondary | ICD-10-CM | POA: Diagnosis not present

## 2021-10-10 LAB — POCT INR: INR: 3.3 — AB (ref 2.0–3.0)

## 2021-10-10 MED ORDER — APIXABAN 5 MG PO TABS: 5.0000 mg | ORAL_TABLET | Freq: Two times a day (BID) | ORAL | 0 refills | Status: DC

## 2021-10-10 NOTE — Patient Instructions (Signed)
-   TAKE 1 TABLET (5 mg) on Friday and then continue dosage of warfarin 5 mg every day EXCEPT 7 mg on [place]. ?- recheck in 2 weeks. ?

## 2021-10-12 ENCOUNTER — Telehealth: Payer: Self-pay

## 2021-10-12 NOTE — Telephone Encounter (Signed)
Prior Authorization for Eliquis 5 mg tablets initiated by [email or web address] Form completed but did ask whether or not patient has tried/failed Xarelto in the past. Maybe the preferred drug but will wait for plan determination. ? ?KEY: [id number]  ? ?Response: Please wait for [employer] 2017 NCPDP to return a determination. ?

## 2021-10-16 NOTE — Telephone Encounter (Signed)
PA was denied for Eliquis ? ?- Need to try Xarelto  ?- Your doctor needs to give [place] specific medical reasons why you cannot take covered drug ? ? ?Will place the denial in Dr. Donivan Scull nurses bin.  ? ?

## 2021-10-17 NOTE — Telephone Encounter (Signed)
Minna Merritts, MD   ? ?Some insurances will not cover Eliquis as Xarelto is cheaper  ?Instead of Eliquis we can send in Xarelto 20 mg daily  ?Thx  ?TGollan   ? ?Called and spoke with patient. Let him know that his PA for Eliquis was denied, and that instead he would be started on Xarelto 20 mg daily.  ? ?Pt verbalized understanding and voiced appreciation for the call.  ?

## 2021-10-17 NOTE — Addendum Note (Signed)
Addended by: Mila Merry on: 10/17/2021 09:29 AM ? ? Modules accepted: Orders ? ?

## 2021-10-19 DIAGNOSIS — H40002 Preglaucoma, unspecified, left eye: Secondary | ICD-10-CM | POA: Diagnosis not present

## 2021-10-19 DIAGNOSIS — H2513 Age-related nuclear cataract, bilateral: Secondary | ICD-10-CM | POA: Diagnosis not present

## 2021-10-19 DIAGNOSIS — H401112 Primary open-angle glaucoma, right eye, moderate stage: Secondary | ICD-10-CM | POA: Diagnosis not present

## 2021-10-23 ENCOUNTER — Encounter: Payer: Self-pay | Admitting: Cardiovascular Disease

## 2021-10-23 MED ORDER — RIVAROXABAN 20 MG PO TABS: 20.0000 mg | ORAL_TABLET | Freq: Every day | ORAL | 3 refills | Status: DC

## 2021-10-23 MED ORDER — RIVAROXABAN 20 MG PO TABS
20.0000 mg | ORAL_TABLET | Freq: Every day | ORAL | 11 refills | Status: DC
Start: 2021-10-23 — End: 2022-10-21

## 2021-10-23 MED ORDER — METOPROLOL TARTRATE 50 MG PO TABS: ORAL_TABLET | ORAL | 1 refills | Status: DC

## 2021-10-23 NOTE — Addendum Note (Signed)
Addended by: Mila Merry on: 10/23/2021 02:23 PM ? ? Modules accepted: Orders ? ?

## 2021-10-23 NOTE — Addendum Note (Signed)
Addended by: Mila Merry on: 10/23/2021 09:03 AM ? ? Modules accepted: Orders ? ?

## 2021-10-24 ENCOUNTER — Ambulatory Visit (INDEPENDENT_AMBULATORY_CARE_PROVIDER_SITE_OTHER): Payer: Medicare Other

## 2021-10-24 DIAGNOSIS — Z5181 Encounter for therapeutic drug level monitoring: Secondary | ICD-10-CM | POA: Diagnosis not present

## 2021-10-24 DIAGNOSIS — I482 Chronic atrial fibrillation, unspecified: Secondary | ICD-10-CM | POA: Diagnosis not present

## 2021-10-24 LAB — POCT INR: INR: 3 (ref 2.0–3.0)

## 2021-10-24 NOTE — Patient Instructions (Signed)
continue dosage of warfarin 5 mg every day EXCEPT 7 mg on [place]. ?- recheck in 4 weeks. ?

## 2021-10-31 ENCOUNTER — Encounter: Payer: Self-pay | Admitting: Internal Medicine

## 2021-10-31 ENCOUNTER — Ambulatory Visit (INDEPENDENT_AMBULATORY_CARE_PROVIDER_SITE_OTHER): Payer: Medicare Other | Admitting: Internal Medicine

## 2021-10-31 DIAGNOSIS — I872 Venous insufficiency (chronic) (peripheral): Secondary | ICD-10-CM

## 2021-10-31 DIAGNOSIS — I10 Essential (primary) hypertension: Secondary | ICD-10-CM | POA: Diagnosis not present

## 2021-10-31 DIAGNOSIS — G4730 Sleep apnea, unspecified: Secondary | ICD-10-CM

## 2021-10-31 DIAGNOSIS — D582 Other hemoglobinopathies: Secondary | ICD-10-CM

## 2021-10-31 DIAGNOSIS — I4891 Unspecified atrial fibrillation: Secondary | ICD-10-CM

## 2021-10-31 DIAGNOSIS — R739 Hyperglycemia, unspecified: Secondary | ICD-10-CM

## 2021-10-31 DIAGNOSIS — D6869 Other thrombophilia: Secondary | ICD-10-CM | POA: Diagnosis not present

## 2021-10-31 DIAGNOSIS — I272 Pulmonary hypertension, unspecified: Secondary | ICD-10-CM | POA: Diagnosis not present

## 2021-10-31 DIAGNOSIS — E7800 Pure hypercholesterolemia, unspecified: Secondary | ICD-10-CM | POA: Diagnosis not present

## 2021-10-31 MED ORDER — LISINOPRIL 40 MG PO TABS: 40.0000 mg | ORAL_TABLET | Freq: Every day | ORAL | 1 refills | Status: DC

## 2021-10-31 NOTE — Progress Notes (Signed)
Patient ID: Michael Blevins, male   DOB: 11-Jul-1950, 71 y.o.   MRN: [id number]   Subjective:    Patient ID: Michael Blevins, male    DOB: [birth year], 71 y.o.   MRN: [id number]   Patient here for a scheduled follow up.   Chief Complaint  Patient presents with   Follow-up    Follow up for HTN   Hypertension   .   HPI Here to follow up regarding his blood pressure.  Reviewed outside readings.  Blood pressures averaging:  120-140/70-90.  Taking medication regularly.  No chest pain or sob reported.  Feels from a cardiac standpoint things are stable.  In transition to [place].  No abdominal pain.  Bowels doing well.  Wearing compression hose.  Swelling better.    Past Medical History:  Diagnosis Date   Allergy    cats and pollen   Asthma    Atrial fibrillation (HCC)    Back pain    Cellulitis    Gout    History of chicken pox    Hyperlipidemia    Hypertension    Kidney stone    Sleep apnea    Venous insufficiency    Past Surgical History:  Procedure Laterality Date   APPENDECTOMY     CARDIAC CATHETERIZATION     Family History  Problem Relation Age of Onset   Hypertension Mother    Diabetes Father    Kidney disease Neg Hx    Prostate cancer Neg Hx    Social History   Socioeconomic History   Marital status: Married    Spouse name: Not on file   Number of children: Not on file   Years of education: Not on file   Highest education level: Not on file  Occupational History   Occupation: retired  Tobacco Use   Smoking status: Former   Smokeless tobacco: Never   Tobacco comments:    quit 33 years  Vaping Use   Vaping Use: Never used  Substance and Sexual Activity   Alcohol use: No    Alcohol/week: 0.0 standard drinks   Drug use: No   Sexual activity: Not on file  Other Topics Concern   Not on file  Social History Narrative   Not on file   Social Determinants of Health   Financial Resource Strain: Not on file  Food Insecurity: Not on file   Transportation Needs: Not on file  Physical Activity: Not on file  Stress: Not on file  Social Connections: Not on file     Review of Systems  Constitutional:  Negative for appetite change and unexpected weight change.  HENT:  Negative for congestion and sinus pressure.   Respiratory:  Negative for cough, chest tightness and shortness of breath.   Cardiovascular:  Negative for chest pain and palpitations.       Swelling better.   Gastrointestinal:  Negative for abdominal pain, diarrhea, nausea and vomiting.  Genitourinary:  Negative for difficulty urinating and dysuria.  Musculoskeletal:  Negative for joint swelling and myalgias.  Skin:  Negative for color change and rash.  Neurological:  Negative for dizziness, light-headedness and headaches.  Psychiatric/Behavioral:  Negative for agitation and dysphoric mood.       Objective:     BP 128/78 (BP Location: Left Arm, Patient Position: Sitting, Cuff Size: Large)   Pulse 62   Temp 97.9 F (36.6 C) (Temporal)   Resp 14   Ht _0  (1.803 m)   Wt 294 lb 6.4  oz (133.5 kg)   SpO2 98%   BMI 41.06 kg/m  Wt Readings from Last 3 Encounters:  10/31/21 294 lb 6.4 oz (133.5 kg)  09/12/21 295 lb 3.2 oz (133.9 kg)  03/09/21 291 lb (132 kg)    Physical Exam Constitutional:      General: He is not in acute distress.    Appearance: Normal appearance. He is well-developed.  HENT:     Head: Normocephalic and atraumatic.     Right Ear: External ear normal.     Left Ear: External ear normal.  Eyes:     General: No scleral icterus.       Right eye: No discharge.        Left eye: No discharge.  Cardiovascular:     Rate and Rhythm: Normal rate and regular rhythm.  Pulmonary:     Effort: Pulmonary effort is normal. No respiratory distress.     Breath sounds: Normal breath sounds.  Abdominal:     General: Bowel sounds are normal.     Palpations: Abdomen is soft.     Tenderness: There is no abdominal tenderness.  Musculoskeletal:         General: No tenderness.     Cervical back: Neck supple. No tenderness.     Comments: No increased swelling.   Lymphadenopathy:     Cervical: No cervical adenopathy.  Skin:    Findings: No erythema or rash.  Neurological:     Mental Status: He is alert.  Psychiatric:        Mood and Affect: Mood normal.        Behavior: Behavior normal.     Outpatient Encounter Medications as of 10/31/2021  Medication Sig   amLODipine (NORVASC) 10 MG tablet TAKE 1 TABLET(10 MG) BY MOUTH DAILY   finasteride (PROSCAR) 5 MG tablet TAKE 1 TABLET(5 MG) BY MOUTH DAILY   furosemide (LASIX) 20 MG tablet TAKE 1 TABLET BY MOUTH TWICE DAILY AS NEEDED   lisinopril (ZESTRIL) 40 MG tablet Take 1 tablet (40 mg total) by mouth daily.   lovastatin (MEVACOR) 20 MG tablet TAKE 1 TABLET(20 MG) BY MOUTH AT BEDTIME   metoprolol tartrate (LOPRESSOR) 50 MG tablet TAKE 1 TABLET(50 MG) BY MOUTH TWICE DAILY   rivaroxaban (XARELTO) 20 MG TABS tablet Take 1 tablet (20 mg total) by mouth daily with supper.   [DISCONTINUED] lisinopril (ZESTRIL) 20 MG tablet TAKE 1 TABLET(20 MG) BY MOUTH DAILY   No facility-administered encounter medications on file as of 10/31/2021.     Lab Results  Component Value Date   WBC 8.7 03/14/2021   HGB 15.5 03/14/2021   HCT 46.0 03/14/2021   PLT 176.0 03/14/2021   GLUCOSE 102 (H) 09/28/2021   CHOL 115 09/28/2021   TRIG 85.0 09/28/2021   HDL 39.40 09/28/2021   LDLDIRECT 77.0 04/06/2018   LDLCALC 59 09/28/2021   ALT 45 09/28/2021   AST 30 09/28/2021   NA 142 09/28/2021   K 3.9 09/28/2021   CL 104 09/28/2021   CREATININE 0.81 09/28/2021   BUN 15 09/28/2021   CO2 29 09/28/2021   TSH 3.46 03/14/2021   PSA 0.47 09/28/2021   INR 3.0 10/24/2021   HGBA1C 5.4 03/14/2021    US Venous Img Lower Unilateral Right  Result Date: 10/12/2017 CLINICAL DATA:  Right lower extremity pain, edema and cellulitis. History of prior great saphenous vein stripping. EXAM: RIGHT LOWER EXTREMITY VENOUS DOPPLER  ULTRASOUND TECHNIQUE: Gray-scale sonography with graded compression, as well as color Doppler and  duplex ultrasound were performed to evaluate the lower extremity deep venous systems from the level of the common femoral vein and including the common femoral, femoral, profunda femoral, popliteal and calf veins including the posterior tibial, peroneal and gastrocnemius veins when visible. The superficial great saphenous vein was also interrogated. Spectral Doppler was utilized to evaluate flow at rest and with distal augmentation maneuvers in the common femoral, femoral and popliteal veins. COMPARISON:  None. FINDINGS: Contralateral Common Femoral Vein: Respiratory phasicity is normal and symmetric with the symptomatic side. No evidence of thrombus. Normal compressibility. Common Femoral Vein: No evidence of thrombus. Normal compressibility, respiratory phasicity and response to augmentation. Saphenofemoral Junction: No evidence of thrombus. Normal compressibility and flow on color Doppler imaging. Profunda Femoral Vein: No evidence of thrombus. Normal compressibility and flow on color Doppler imaging. Femoral Vein: No evidence of thrombus. Normal compressibility, respiratory phasicity and response to augmentation. Popliteal Vein: No evidence of thrombus. Normal compressibility, respiratory phasicity and response to augmentation. Calf Veins: No evidence of thrombus. Normal compressibility and flow on color Doppler imaging. Superficial Great Saphenous Vein: Not visualized. Venous Reflux:  None. Other Findings: No evidence of superficial thrombophlebitis or abnormal fluid collection. IMPRESSION: No evidence of right lower extremity deep venous thrombosis. Electronically Signed   By: Aletta Edouard M.D.   On: 10/12/2017 09:38       Assessment & Plan:   Problem List Items Addressed This Visit     Acquired thrombophilia ([place])    Afib. Has been on coumadin.  Transitioning over to [place].        Apnea, sleep     Uses cpap regularly.         Atrial fibrillation ([place])    Followed by cardiology. Has been on coumadin. Transitioning over to [place].   Follow. Stable.        Relevant Medications   lisinopril (ZESTRIL) 40 MG tablet   Elevated hemoglobin (HCC)    Being treated for OSA. Using cpap regularly.  Follow cbc.        Essential hypertension    On metoprolol, amlodipine and lisinopril. Blood pressure as outlined. Elevated above goal.  Increase lisinopril to 36m q day.  Follow met b.        Relevant Medications   lisinopril (ZESTRIL) 40 MG tablet   Other Relevant Orders   Basic metabolic panel   Hypercholesterolemia    On lovastatin.  Low cholesterol diet and exercise.  Follow lipid panel and liver function tests.        Relevant Medications   lisinopril (ZESTRIL) 40 MG tablet   Other Relevant Orders   Hepatic function panel   Lipid panel   Hyperglycemia    Low carb diet and exercise.  Follow met b and a1c.        Pulmonary hypertension ([place]    Has been evaluated by cardiology.  Using cpap.  Breathing stable.        Relevant Medications   lisinopril (ZESTRIL) 40 MG tablet   Venous insufficiency    Continue compression hose. Swelling improved.        Relevant Medications   lisinopril (ZESTRIL) 40 MG tablet     CEinar Pheasant MD

## 2021-11-06 ENCOUNTER — Encounter: Payer: Self-pay | Admitting: Internal Medicine

## 2021-11-06 NOTE — Assessment & Plan Note (Signed)
Uses cpap regularly.   

## 2021-11-06 NOTE — Assessment & Plan Note (Signed)
Continue compression hose. Swelling improved.  

## 2021-11-06 NOTE — Assessment & Plan Note (Signed)
Afib. Has been on coumadin.  Transitioning over to [place].

## 2021-11-06 NOTE — Assessment & Plan Note (Signed)
Followed by cardiology. Has been on coumadin. Transitioning over to [place].   Follow. Stable.

## 2021-11-06 NOTE — Assessment & Plan Note (Signed)
Low carb diet and exercise.  Follow met b and a1c.  

## 2021-11-06 NOTE — Assessment & Plan Note (Signed)
On lovastatin.  Low cholesterol diet and exercise.  Follow lipid panel and liver function tests.   

## 2021-11-06 NOTE — Assessment & Plan Note (Signed)
On metoprolol, amlodipine and lisinopril. Blood pressure as outlined. Elevated above goal.  Increase lisinopril to 43m q day.  Follow met b.

## 2021-11-06 NOTE — Assessment & Plan Note (Signed)
Has been evaluated by cardiology.  Using cpap.  Breathing stable.  

## 2021-11-06 NOTE — Assessment & Plan Note (Signed)
Being treated for OSA. Using cpap regularly.  Follow cbc.  

## 2021-12-11 ENCOUNTER — Other Ambulatory Visit: Payer: Self-pay | Admitting: Internal Medicine

## 2021-12-12 DIAGNOSIS — H2511 Age-related nuclear cataract, right eye: Secondary | ICD-10-CM | POA: Diagnosis not present

## 2021-12-17 ENCOUNTER — Encounter: Payer: Self-pay | Admitting: Ophthalmology

## 2021-12-24 MED ORDER — TRANEXAMIC ACID-NACL 1000-0.7 MG/100ML-% IV SOLN
INTRAVENOUS | Status: AC
  Filled 2021-12-24: qty 100

## 2021-12-24 NOTE — Discharge Instructions (Signed)

## 2021-12-26 ENCOUNTER — Ambulatory Visit: Payer: Medicare Other | Admitting: Anesthesiology

## 2021-12-26 ENCOUNTER — Encounter
Admission: RE | Disposition: A | Discharge status: Home / Self Care | Payer: Self-pay | Source: Home / Self Care | Attending: Ophthalmology

## 2021-12-26 ENCOUNTER — Encounter: Payer: Self-pay | Admitting: Ophthalmology

## 2021-12-26 ENCOUNTER — Ambulatory Visit
Admission: RE | Admit: 2021-12-26 | Discharge: 2021-12-26 | Disposition: A | Discharge status: Home / Self Care | Payer: Medicare Other | Attending: Ophthalmology | Admitting: Ophthalmology

## 2021-12-26 ENCOUNTER — Other Ambulatory Visit: Payer: Self-pay

## 2021-12-26 DIAGNOSIS — H2511 Age-related nuclear cataract, right eye: Secondary | ICD-10-CM | POA: Insufficient documentation

## 2021-12-26 DIAGNOSIS — Z87891 Personal history of nicotine dependence: Secondary | ICD-10-CM | POA: Insufficient documentation

## 2021-12-26 DIAGNOSIS — G4730 Sleep apnea, unspecified: Secondary | ICD-10-CM | POA: Insufficient documentation

## 2021-12-26 DIAGNOSIS — I10 Essential (primary) hypertension: Secondary | ICD-10-CM | POA: Insufficient documentation

## 2021-12-26 DIAGNOSIS — M1990 Unspecified osteoarthritis, unspecified site: Secondary | ICD-10-CM | POA: Diagnosis not present

## 2021-12-26 DIAGNOSIS — N200 Calculus of kidney: Secondary | ICD-10-CM | POA: Diagnosis not present

## 2021-12-26 DIAGNOSIS — I4891 Unspecified atrial fibrillation: Secondary | ICD-10-CM | POA: Insufficient documentation

## 2021-12-26 DIAGNOSIS — J45909 Unspecified asthma, uncomplicated: Secondary | ICD-10-CM | POA: Diagnosis not present

## 2021-12-26 DIAGNOSIS — I499 Cardiac arrhythmia, unspecified: Secondary | ICD-10-CM | POA: Diagnosis not present

## 2021-12-26 HISTORY — PX: CATARACT EXTRACTION W/PHACO: SHX586

## 2021-12-26 HISTORY — DX: Motion sickness, initial encounter: T75.3XXA

## 2021-12-26 HISTORY — DX: Unspecified osteoarthritis, unspecified site: M19.90

## 2021-12-26 SURGERY — PHACOEMULSIFICATION, CATARACT, WITH IOL INSERTION
Anesthesia: Monitor Anesthesia Care | Site: Eye | Laterality: Right

## 2021-12-26 MED ORDER — SIGHTPATH DOSE#1 BSS IO SOLN
INTRAOCULAR | Status: DC | PRN
  Administered 2021-12-26: 1 mL via INTRAMUSCULAR

## 2021-12-26 MED ORDER — MIDAZOLAM HCL 2 MG/2ML IJ SOLN
INTRAMUSCULAR | Status: DC | PRN
  Administered 2021-12-26: 1 mg via INTRAVENOUS

## 2021-12-26 MED ORDER — TETRACAINE HCL 0.5 % OP SOLN
1.0000 [drp] | OPHTHALMIC | Status: DC | PRN
  Administered 2021-12-26 (×3): 1 [drp] via OPHTHALMIC

## 2021-12-26 MED ORDER — ARMC OPHTHALMIC DILATING DROPS
1.0000 | OPHTHALMIC | Status: DC | PRN
  Administered 2021-12-26 (×3): 1 via OPHTHALMIC

## 2021-12-26 MED ORDER — CEFUROXIME OPHTHALMIC INJECTION 1 MG/0.1 ML
INJECTION | OPHTHALMIC | Status: DC | PRN
  Administered 2021-12-26: 1 mg via OPHTHALMIC

## 2021-12-26 MED ORDER — SIGHTPATH DOSE#1 BSS IO SOLN
INTRAOCULAR | Status: DC | PRN
  Administered 2021-12-26: 45 mL via OPHTHALMIC

## 2021-12-26 MED ORDER — SIGHTPATH DOSE#1 NA HYALUR & NA CHOND-NA HYALUR IO KIT
PACK | INTRAOCULAR | Status: DC | PRN
  Administered 2021-12-26: 1 via OPHTHALMIC

## 2021-12-26 MED ORDER — FENTANYL CITRATE (PF) 100 MCG/2ML IJ SOLN
INTRAMUSCULAR | Status: DC | PRN
  Administered 2021-12-26: 50 ug via INTRAVENOUS

## 2021-12-26 MED ORDER — BRIMONIDINE TARTRATE-TIMOLOL 0.2-0.5 % OP SOLN
OPHTHALMIC | Status: DC | PRN
  Administered 2021-12-26: 1 [drp] via OPHTHALMIC

## 2021-12-26 MED ORDER — SIGHTPATH DOSE#1 BSS IO SOLN
INTRAOCULAR | Status: DC | PRN
  Administered 2021-12-26: 15 mL

## 2021-12-26 SURGICAL SUPPLY — 11 items
CATARACT SUITE SIGHTPATH (MISCELLANEOUS) ×2 IMPLANT
FEE CATARACT SUITE SIGHTPATH (MISCELLANEOUS) ×1 IMPLANT
GLOVE SRG 8 PF TXTR STRL LF DI (GLOVE) ×1 IMPLANT
GLOVE SURG ENC TEXT LTX SZ7.5 (GLOVE) ×2 IMPLANT
GLOVE SURG UNDER POLY LF SZ8 (GLOVE) ×2
LENS IOL TECNIS EYHANCE 15.5 (Intraocular Lens) ×1 IMPLANT
NDL FILTER BLUNT 18X1 1/2 (NEEDLE) ×1 IMPLANT
NEEDLE FILTER BLUNT 18X 1/2SAF (NEEDLE) ×1
NEEDLE FILTER BLUNT 18X1 1/2 (NEEDLE) ×1 IMPLANT
SYR 3ML LL SCALE MARK (SYRINGE) ×2 IMPLANT
WATER STERILE IRR 250ML POUR (IV SOLUTION) ×2 IMPLANT

## 2021-12-26 NOTE — Transfer of Care (Signed)
Immediate Anesthesia Transfer of Care Note  Patient: Michael Blevins  Procedure(s) Performed: CATARACT EXTRACTION PHACO AND INTRAOCULAR LENS PLACEMENT (IOC) RIGHT (Right: Eye)  Patient Location: PACU  Anesthesia Type: MAC  Level of Consciousness: awake, alert  and patient cooperative  Airway and Oxygen Therapy: Patient Spontanous Breathing and Patient connected to supplemental oxygen  Post-op Assessment: Post-op Vital signs reviewed, Patient's Cardiovascular Status Stable, Respiratory Function Stable, Patent Airway and No signs of Nausea or vomiting  Post-op Vital Signs: Reviewed and stable  Complications: No notable events documented.

## 2021-12-26 NOTE — H&P (Signed)
[facility]   Primary Care Physician:  Einar Pheasant, MD Ophthalmologist: Dr. Leandrew Koyanagi  Pre-Procedure History & Physical: HPI:  Michael Blevins is a 71 y.o. male here for ophthalmic surgery.   Past Medical History:  Diagnosis Date   Allergy    cats and pollen   Arthritis    Asthma    Atrial fibrillation (HCC)    Back pain    Cellulitis    Gout    History of chicken pox    Hyperlipidemia    Hypertension    Kidney stone    Motion sickness    Sleep apnea    Venous insufficiency     Past Surgical History:  Procedure Laterality Date   APPENDECTOMY     CARDIAC CATHETERIZATION      Prior to Admission medications   Medication Sig Start Date End Date Taking? Authorizing Provider  amLODipine (NORVASC) 10 MG tablet TAKE 1 TABLET(10 MG) BY MOUTH DAILY 06/18/21  Yes Gollan, Kathlene November, MD  docusate sodium (STOOL SOFTENER) 100 MG capsule Take 100 mg by mouth daily.   Yes [provider]  finasteride (PROSCAR) 5 MG tablet TAKE 1 TABLET(5 MG) BY MOUTH DAILY 12/12/21  Yes Einar Pheasant, MD  furosemide (LASIX) 20 MG tablet TAKE 1 TABLET BY MOUTH TWICE DAILY AS NEEDED 06/18/21  Yes Gollan, Kathlene November, MD  glucosamine-chondroitin 500-400 MG tablet Take 1 tablet by mouth 3 (three) times daily.   Yes [provider]  lisinopril (ZESTRIL) 40 MG tablet Take 1 tablet (40 mg total) by mouth daily. 10/31/21  Yes Einar Pheasant, MD  lovastatin (MEVACOR) 20 MG tablet TAKE 1 TABLET(20 MG) BY MOUTH AT BEDTIME 06/18/21  Yes Gollan, Kathlene November, MD  metoprolol tartrate (LOPRESSOR) 50 MG tablet TAKE 1 TABLET(50 MG) BY MOUTH TWICE DAILY 10/23/21  Yes Gollan, Kathlene November, MD  Multiple Vitamin (MULTIVITAMIN) tablet Take 1 tablet by mouth daily.   Yes [provider]  Omega-3 Fatty Acids (FISH OIL) 1000 MG CPDR Take by mouth.   Yes [provider]  rivaroxaban (XARELTO) 20 MG TABS tablet Take 1 tablet (20 mg total) by mouth daily with supper. 10/23/21  Yes Minna Merritts, MD    Allergies as of 10/22/2021   (No Known Allergies)    Family History  Problem Relation Age of Onset   Hypertension Mother    Diabetes Father    Kidney disease Neg Hx    Prostate cancer Neg Hx     Social History   Socioeconomic History   Marital status: Married    Spouse name: Not on file   Number of children: Not on file   Years of education: Not on file   Highest education level: Not on file  Occupational History   Occupation: retired  Tobacco Use   Smoking status: Former   Smokeless tobacco: Never   Tobacco comments:    quit 33 years  Vaping Use   Vaping Use: Never used  Substance and Sexual Activity   Alcohol use: No    Alcohol/week: 0.0 standard drinks of alcohol   Drug use: No   Sexual activity: Not on file  Other Topics Concern   Not on file  Social History Narrative   Not on file   Social Determinants of Health   Financial Resource Strain: Not on file  Food Insecurity: Not on file  Transportation Needs: Not on file  Physical Activity: Not on file  Stress: Not on file  Social Connections:  Not on file  Intimate Partner Violence: Not on file    Review of Systems: See HPI, otherwise negative ROS  Physical Exam: BP (!) 164/104   Pulse 62   Temp (!) 97.5 F (36.4 C)   Resp 20   Ht '6\' 1"'$  (1.854 m)   Wt 132.9 kg   SpO2 98%   BMI 38.66 kg/m  General:   Alert,  pleasant and cooperative in NAD Head:  Normocephalic and atraumatic. Lungs:  Clear to auscultation.    Heart:  Regular rate and rhythm.   Impression/Plan: Michael Blevins is here for ophthalmic surgery.  Risks, benefits, limitations, and alternatives regarding ophthalmic surgery have been reviewed with the patient.  Questions have been answered.  All parties agreeable.   Leandrew Koyanagi, MD  12/26/2021, 12:24 PM

## 2021-12-26 NOTE — Op Note (Signed)
LOCATION:  [place]   PREOPERATIVE DIAGNOSIS:    Nuclear sclerotic cataract right eye. H25.11   POSTOPERATIVE DIAGNOSIS:  Nuclear sclerotic cataract right eye.     PROCEDURE:  Phacoemusification with posterior chamber intraocular lens placement of the right eye   ULTRASOUND TIME: Procedure(s) with comments: CATARACT EXTRACTION PHACO AND INTRAOCULAR LENS PLACEMENT (IOC) RIGHT (Right) - 4.81 00:44.4  LENS:   Implant Name Type Inv. Item Serial No. Manufacturer Lot No. LRB No. Used Action  LENS IOL TECNIS EYHANCE 15.5 - [id number] Intraocular Lens LENS IOL TECNIS EYHANCE 15.5 [id number] [employer]  Right 1 Implanted         SURGEON:  Wyonia Hough, MD   ANESTHESIA:  Topical with tetracaine drops and 2% Xylocaine jelly, augmented with 1% preservative-free intracameral lidocaine.    COMPLICATIONS:  None.   DESCRIPTION OF PROCEDURE:  The patient was identified in the holding room and transported to the operating room and placed in the supine position under the operating microscope.  The right eye was identified as the operative eye and it was prepped and draped in the usual sterile ophthalmic fashion.   A 1 millimeter clear-corneal paracentesis was made at the 12:00 position.  0.5 ml of preservative-free 1% lidocaine was injected into the anterior chamber. The anterior chamber was filled with Viscoat viscoelastic.  A 2.4 millimeter keratome was used to make a near-clear corneal incision at the 9:00 position.  A curvilinear capsulorrhexis was made with a cystotome and capsulorrhexis forceps.  Balanced salt solution was used to hydrodissect and hydrodelineate the nucleus.   Phacoemulsification was then used in stop and chop fashion to remove the lens nucleus and epinucleus.  The remaining cortex was then removed using the irrigation and aspiration handpiece. Provisc was then placed into the capsular bag to distend it for lens placement.  A lens was then injected into the  capsular bag.  The remaining viscoelastic was aspirated.   Wounds were hydrated with balanced salt solution.  The anterior chamber was inflated to a physiologic pressure with balanced salt solution.  No wound leaks were noted. Cefuroxime 0.1 ml of a '10mg'$ /ml solution was injected into the anterior chamber for a dose of 1 mg of intracameral antibiotic at the completion of the case.   Timolol and Brimonidine drops were applied to the eye.  The patient was taken to the recovery room in stable condition without complications of anesthesia or surgery.   Deyci Gesell 12/26/2021, 12:46 PM

## 2021-12-26 NOTE — Anesthesia Preprocedure Evaluation (Signed)
Anesthesia Evaluation  Patient identified by MRN, date of birth, ID band Patient awake    Reviewed: Allergy & Precautions, NPO status , Patient's Chart, lab work & pertinent test results  Airway Mallampati: II  TM Distance: >3 FB Neck ROM: Full    Dental no notable dental hx.    Pulmonary asthma , sleep apnea , former smoker,    Pulmonary exam normal        Cardiovascular hypertension, Normal cardiovascular exam+ dysrhythmias Atrial Fibrillation      Neuro/Psych negative neurological ROS  negative psych ROS   GI/Hepatic negative GI ROS,   Endo/Other    Renal/GU Renal disease (Kidney stones)     Musculoskeletal  (+) Arthritis ,   Abdominal Normal abdominal exam  (+)   Peds  Hematology   Anesthesia Other Findings   Reproductive/Obstetrics                             Anesthesia Physical Anesthesia Plan  ASA: 3  Anesthesia Plan: MAC   Post-op Pain Management: Minimal or no pain anticipated   Induction: Intravenous  PONV Risk Score and Plan: 1 and TIVA, Midazolam and Treatment may vary due to age or medical condition  Airway Management Planned: Nasal Cannula and Natural Airway  Additional Equipment:   Intra-op Plan:   Post-operative Plan:   Informed Consent: I have reviewed the patients History and Physical, chart, labs and discussed the procedure including the risks, benefits and alternatives for the proposed anesthesia with the patient or authorized representative who has indicated his/her understanding and acceptance.     Dental advisory given  Plan Discussed with: CRNA  Anesthesia Plan Comments:         Anesthesia Quick Evaluation

## 2021-12-26 NOTE — Anesthesia Postprocedure Evaluation (Signed)
Anesthesia Post Note  Patient: Michael Blevins  Procedure(s) Performed: CATARACT EXTRACTION PHACO AND INTRAOCULAR LENS PLACEMENT (IOC) RIGHT (Right: Eye)     Patient location during evaluation: PACU Anesthesia Type: MAC Level of consciousness: awake and alert Pain management: pain level controlled Vital Signs Assessment: post-procedure vital signs reviewed and stable Respiratory status: nonlabored ventilation and spontaneous breathing Cardiovascular status: blood pressure returned to baseline Postop Assessment: no apparent nausea or vomiting Anesthetic complications: no   No notable events documented.  [name] [employer]

## 2021-12-27 ENCOUNTER — Encounter: Payer: Self-pay | Admitting: Ophthalmology

## 2022-01-04 ENCOUNTER — Other Ambulatory Visit (INDEPENDENT_AMBULATORY_CARE_PROVIDER_SITE_OTHER): Payer: Medicare Other

## 2022-01-04 DIAGNOSIS — I10 Essential (primary) hypertension: Secondary | ICD-10-CM | POA: Diagnosis not present

## 2022-01-04 DIAGNOSIS — E7800 Pure hypercholesterolemia, unspecified: Secondary | ICD-10-CM | POA: Diagnosis not present

## 2022-01-04 LAB — HEPATIC FUNCTION PANEL
ALT: 20 U/L (ref 0–53)
AST: 24 U/L (ref 0–37)
Albumin: 4.1 g/dL (ref 3.5–5.2)
Alkaline Phosphatase: 63 U/L (ref 39–117)
Bilirubin, Direct: 0.3 mg/dL (ref 0.0–0.3)
Total Bilirubin: 1.4 mg/dL — ABNORMAL HIGH (ref 0.2–1.2)
Total Protein: 6.7 g/dL (ref 6.0–8.3)

## 2022-01-04 LAB — BASIC METABOLIC PANEL
BUN: 15 mg/dL (ref 6–23)
CO2: 29 mEq/L (ref 19–32)
Calcium: 9 mg/dL (ref 8.4–10.5)
Chloride: 105 mEq/L (ref 96–112)
Creatinine, Ser: 0.99 mg/dL (ref 0.40–1.50)
GFR: 76.54 mL/min (ref >60.00)
Glucose, Bld: 100 mg/dL — ABNORMAL HIGH (ref 70–99)
Potassium: 3.8 mEq/L (ref 3.5–5.1)
Sodium: 142 mEq/L (ref 135–145)

## 2022-01-04 LAB — LIPID PANEL
Cholesterol: 102 mg/dL (ref 0–200)
HDL: 32.8 mg/dL — ABNORMAL LOW (ref >39.00)
LDL Cholesterol: 50 mg/dL (ref 0–99)
NonHDL: 69.2
Total CHOL/HDL Ratio: 3
Triglycerides: 94 mg/dL (ref 0.0–149.0)
VLDL: 18.8 mg/dL (ref 0.0–40.0)

## 2022-01-08 ENCOUNTER — Telehealth: Payer: Self-pay

## 2022-01-08 NOTE — Telephone Encounter (Signed)
Lm for pt to cb re: need to move appointment to Friday at 11 per Dr Nicki Reaper

## 2022-01-09 ENCOUNTER — Encounter: Payer: Medicare Other | Admitting: Internal Medicine

## 2022-01-11 ENCOUNTER — Ambulatory Visit: Payer: Medicare Other | Admitting: Internal Medicine

## 2022-01-22 ENCOUNTER — Ambulatory Visit (INDEPENDENT_AMBULATORY_CARE_PROVIDER_SITE_OTHER): Payer: Medicare Other | Admitting: Internal Medicine

## 2022-01-22 ENCOUNTER — Encounter: Payer: Self-pay | Admitting: Internal Medicine

## 2022-01-22 DIAGNOSIS — I272 Pulmonary hypertension, unspecified: Secondary | ICD-10-CM | POA: Diagnosis not present

## 2022-01-22 DIAGNOSIS — I4891 Unspecified atrial fibrillation: Secondary | ICD-10-CM

## 2022-01-22 DIAGNOSIS — R739 Hyperglycemia, unspecified: Secondary | ICD-10-CM | POA: Diagnosis not present

## 2022-01-22 DIAGNOSIS — I10 Essential (primary) hypertension: Secondary | ICD-10-CM

## 2022-01-22 DIAGNOSIS — G4733 Obstructive sleep apnea (adult) (pediatric): Secondary | ICD-10-CM

## 2022-01-22 DIAGNOSIS — D6869 Other thrombophilia: Secondary | ICD-10-CM | POA: Diagnosis not present

## 2022-01-22 DIAGNOSIS — E7800 Pure hypercholesterolemia, unspecified: Secondary | ICD-10-CM

## 2022-01-22 DIAGNOSIS — I872 Venous insufficiency (chronic) (peripheral): Secondary | ICD-10-CM | POA: Diagnosis not present

## 2022-01-22 MED ORDER — LISINOPRIL 40 MG PO TABS: 40.0000 mg | ORAL_TABLET | Freq: Every day | ORAL | 3 refills | Status: DC

## 2022-01-22 MED ORDER — TRIAMCINOLONE ACETONIDE 0.1 % EX CREA: 1.0000 | TOPICAL_CREAM | Freq: Two times a day (BID) | CUTANEOUS | 0 refills | Status: DC

## 2022-01-22 NOTE — Progress Notes (Signed)
Patient ID: Michael Blevins, male   DOB: 1950/10/21, 71 y.o.   MRN: [id number]   Subjective:    Patient ID: Michael Blevins, male    DOB: 03-23-1951, 71 y.o.   MRN: [id number]   Patient here for a scheduled follow up.   Chief Complaint  Patient presents with   Follow-up   .   HPI Here to follow up regarding his blood pressure and cholesterol.  He reports he is doing well.  Tries to stay active.  No chest pain.  Breathing stable.  No abdominal pain or bowel change reported.  Outside blood pressure checks - 130s/80-85.     Past Medical History:  Diagnosis Date   Allergy    cats and pollen   Arthritis    Asthma    Atrial fibrillation ([place])    Back pain    Cellulitis    Gout    History of chicken pox    Hyperlipidemia    Hypertension    Kidney stone    Motion sickness    Sleep apnea    Venous insufficiency    Past Surgical History:  Procedure Laterality Date   APPENDECTOMY     CARDIAC CATHETERIZATION     CATARACT EXTRACTION W/PHACO Right 12/26/2021   Procedure: CATARACT EXTRACTION PHACO AND INTRAOCULAR LENS PLACEMENT ([place]) RIGHT;  Surgeon: Leandrew Koyanagi, MD;  Location: [facility];  Service: Ophthalmology;  Laterality: Right;  4.81 00:44.4   Family History  Problem Relation Age of Onset   Hypertension Mother    Diabetes Father    Kidney disease Neg Hx    Prostate cancer Neg Hx    Social History   Socioeconomic History   Marital status: Married    Spouse name: Not on file   Number of children: Not on file   Years of education: Not on file   Highest education level: Not on file  Occupational History   Occupation: retired  Tobacco Use   Smoking status: Former   Smokeless tobacco: Never   Tobacco comments:    quit 33 years  Vaping Use   Vaping Use: Never used  Substance and Sexual Activity   Alcohol use: No    Alcohol/week: 0.0 standard drinks of alcohol   Drug use: No   Sexual activity: Not on file  Other Topics Concern   Not on file   Social History Narrative   Not on file   Social Determinants of Health   Financial Resource Strain: Not on file  Food Insecurity: Not on file  Transportation Needs: Not on file  Physical Activity: Not on file  Stress: Not on file  Social Connections: Not on file     Review of Systems  Constitutional:  Negative for appetite change and unexpected weight change.  HENT:  Negative for congestion and sinus pressure.   Respiratory:  Negative for cough, chest tightness and shortness of breath.   Cardiovascular:  Negative for chest pain and palpitations.       Leg swelling doing better with compression hose.    Gastrointestinal:  Negative for abdominal pain, diarrhea, nausea and vomiting.  Genitourinary:  Negative for difficulty urinating and dysuria.  Musculoskeletal:  Negative for joint swelling and myalgias.  Skin:  Negative for color change and rash.  Neurological:  Negative for dizziness, light-headedness and headaches.  Psychiatric/Behavioral:  Negative for agitation and dysphoric mood.        Objective:     BP 136/84   Pulse 72  Temp (!) 97.5 F (36.4 C) (Oral)   Resp 16   Ht _0  (1.854 m)   Wt 297 lb (134.7 kg)   SpO2 99%   BMI 39.18 kg/m  Wt Readings from Last 3 Encounters:  01/22/22 297 lb (134.7 kg)  12/26/21 293 lb (132.9 kg)  10/31/21 294 lb 6.4 oz (133.5 kg)    Physical Exam Constitutional:      General: He is not in acute distress.    Appearance: Normal appearance. He is well-developed.  HENT:     Head: Normocephalic and atraumatic.     Right Ear: External ear normal.     Left Ear: External ear normal.  Eyes:     General: No scleral icterus.       Right eye: No discharge.        Left eye: No discharge.  Cardiovascular:     Rate and Rhythm: Normal rate and regular rhythm.  Pulmonary:     Effort: Pulmonary effort is normal. No respiratory distress.     Breath sounds: Normal breath sounds.  Abdominal:     General: Bowel sounds are normal.      Palpations: Abdomen is soft.     Tenderness: There is no abdominal tenderness.  Musculoskeletal:        General: No swelling or tenderness.     Cervical back: Neck supple. No tenderness.  Lymphadenopathy:     Cervical: No cervical adenopathy.  Skin:    Findings: No erythema or rash.  Neurological:     Mental Status: He is alert.  Psychiatric:        Mood and Affect: Mood normal.        Behavior: Behavior normal.      Outpatient Encounter Medications as of 01/22/2022  Medication Sig   amLODipine (NORVASC) 10 MG tablet TAKE 1 TABLET(10 MG) BY MOUTH DAILY   docusate sodium (STOOL SOFTENER) 100 MG capsule Take 100 mg by mouth daily.   finasteride (PROSCAR) 5 MG tablet TAKE 1 TABLET(5 MG) BY MOUTH DAILY   furosemide (LASIX) 20 MG tablet TAKE 1 TABLET BY MOUTH TWICE DAILY AS NEEDED   glucosamine-chondroitin 500-400 MG tablet Take 1 tablet by mouth 3 (three) times daily.   lovastatin (MEVACOR) 20 MG tablet TAKE 1 TABLET(20 MG) BY MOUTH AT BEDTIME   metoprolol tartrate (LOPRESSOR) 50 MG tablet TAKE 1 TABLET(50 MG) BY MOUTH TWICE DAILY   Multiple Vitamin (MULTIVITAMIN) tablet Take 1 tablet by mouth daily.   Omega-3 Fatty Acids (FISH OIL) 1000 MG CPDR Take by mouth.   rivaroxaban (XARELTO) 20 MG TABS tablet Take 1 tablet (20 mg total) by mouth daily with supper.   triamcinolone cream (KENALOG) 0.1 % Apply 1 Application topically 2 (two) times daily.   [DISCONTINUED] lisinopril (ZESTRIL) 40 MG tablet Take 1 tablet (40 mg total) by mouth daily.   lisinopril (ZESTRIL) 40 MG tablet Take 1 tablet (40 mg total) by mouth daily.   No facility-administered encounter medications on file as of 01/22/2022.     Lab Results  Component Value Date   WBC 8.7 03/14/2021   HGB 15.5 03/14/2021   HCT 46.0 03/14/2021   PLT 176.0 03/14/2021   GLUCOSE 100 (H) 01/04/2022   CHOL 102 01/04/2022   TRIG 94.0 01/04/2022   HDL 32.80 (L) 01/04/2022   LDLDIRECT 77.0 04/06/2018   LDLCALC 50 01/04/2022   ALT 20  01/04/2022   AST 24 01/04/2022   NA 142 01/04/2022   K 3.8 01/04/2022  CL 105 01/04/2022   CREATININE 0.99 01/04/2022   BUN 15 01/04/2022   CO2 29 01/04/2022   TSH 3.46 03/14/2021   PSA 0.47 09/28/2021   INR 3.0 10/24/2021   HGBA1C 5.4 03/14/2021       Assessment & Plan:   Problem List Items Addressed This Visit     Acquired thrombophilia ([place])    Afib. On xarelto.       Atrial fibrillation ([place])    Followed by cardiology. On xarelto now.  Stable.  Follow.       Relevant Medications   lisinopril (ZESTRIL) 40 MG tablet   Essential hypertension    On metoprolol, amlodipine and lisinopril. Blood pressure as outlined. Checks at home - 249J systolic - low to mid 24N diastolic.  Hold on making changes. Spot check pressure.  Follow met b.      Relevant Medications   lisinopril (ZESTRIL) 40 MG tablet   Other Relevant Orders   Basic metabolic panel   Hypercholesterolemia    On lovastatin.  Low cholesterol diet and exercise.  Follow lipid panel and liver function tests.       Relevant Medications   lisinopril (ZESTRIL) 40 MG tablet   Other Relevant Orders   CBC with Differential/Platelet   Hepatic function panel   Lipid panel   Hyperglycemia    Low carb diet and exercise.  Follow met b and a1c.       Relevant Orders   Hemoglobin A1c   Obstructive sleep apnea syndrome    Uses cpap regularly.       Pulmonary hypertension ([place])    Has been evaluated by cardiology.  Using cpap.  Breathing stable.       Relevant Medications   lisinopril (ZESTRIL) 40 MG tablet   Venous insufficiency    Continue compression hose. Swelling improved.       Relevant Medications   lisinopril (ZESTRIL) 40 MG tablet     Einar Pheasant, MD

## 2022-02-03 ENCOUNTER — Encounter: Payer: Self-pay | Admitting: Internal Medicine

## 2022-02-03 NOTE — Assessment & Plan Note (Signed)
Low carb diet and exercise.  Follow met b and a1c.  

## 2022-02-03 NOTE — Assessment & Plan Note (Signed)
Afib. On xarelto.  

## 2022-02-03 NOTE — Assessment & Plan Note (Signed)
Continue compression hose. Swelling improved.

## 2022-02-03 NOTE — Assessment & Plan Note (Signed)
Has been evaluated by cardiology.  Using cpap.  Breathing stable.  

## 2022-02-03 NOTE — Assessment & Plan Note (Signed)
On metoprolol, amlodipine and lisinopril. Blood pressure as outlined. Checks at home - 950H systolic - low to mid 22V diastolic.  Hold on making changes. Spot check pressure.  Follow met b.

## 2022-02-03 NOTE — Assessment & Plan Note (Signed)
On lovastatin.  Low cholesterol diet and exercise.  Follow lipid panel and liver function tests.   

## 2022-02-03 NOTE — Assessment & Plan Note (Signed)
Uses cpap regularly.   

## 2022-02-03 NOTE — Assessment & Plan Note (Signed)
Followed by cardiology. On xarelto now.  Stable.  Follow.  

## 2022-02-13 DIAGNOSIS — Z8739 Personal history of other diseases of the musculoskeletal system and connective tissue: Secondary | ICD-10-CM | POA: Diagnosis not present

## 2022-02-13 DIAGNOSIS — M79672 Pain in left foot: Secondary | ICD-10-CM | POA: Diagnosis not present

## 2022-02-13 DIAGNOSIS — Z7901 Long term (current) use of anticoagulants: Secondary | ICD-10-CM | POA: Diagnosis not present

## 2022-02-22 DIAGNOSIS — D2372 Other benign neoplasm of skin of left lower limb, including hip: Secondary | ICD-10-CM | POA: Diagnosis not present

## 2022-02-22 DIAGNOSIS — D2371 Other benign neoplasm of skin of right lower limb, including hip: Secondary | ICD-10-CM | POA: Diagnosis not present

## 2022-02-22 DIAGNOSIS — R202 Paresthesia of skin: Secondary | ICD-10-CM | POA: Diagnosis not present

## 2022-02-22 DIAGNOSIS — L821 Other seborrheic keratosis: Secondary | ICD-10-CM | POA: Diagnosis not present

## 2022-03-08 DIAGNOSIS — R319 Hematuria, unspecified: Secondary | ICD-10-CM | POA: Diagnosis not present

## 2022-03-09 NOTE — Progress Notes (Unsigned)
Cardiology Office Note  Date:  03/09/2022   ID:  ZAKI GERTSCH, DOB 02-16-1951, MRN [id number]  PCP:  Einar Pheasant, MD   No chief complaint on file.   HPI:  Mr. Tirado is a 84 history-year-old gentleman with history of  Remote history of smoking for 17 years Obesity, atrial fibrillation, permanent cardioversion in 2011, did not hold, on chronic anticoagulation ,  sleep apnea on CPAP,   Essential hypertension,  Hyperlipidemia,  Small atrial septal defect ( QP/QS 1.08 ),  Venous insufficiency,  prior cardiac catheterization in 2011 by report showing no significant coronary disease,  who presents for follow-up of his permanent atrial fibrillation, ASD, hyperlipidemia  Last seen by myself in clinic September 2022   On warfarin  INR well controlled  R>L leg swelling, chronic Prior cellulitis RLE likely contributed to chronic swelling Wears compressions On amlodipine (does not want to change at this time) Blood pressure well controlled at home  Rare "fluttering", does not feel that heart rate is going fast  Lasix 40 in AM Denies shortness of breath on exertion Stable renal function  No chest pain concerning for angina Total cholesterol well controlled less than 120  EKG personally reviewed by myself on todays visit, no change Shows Atrial fibrillation with ventricular rate 75 bpm  Other past medical history reviewed Cellulitis in 10/2017 after sitting in a car driving to [place]  Previously seen at [facility],  stress echocardiogram 06/14/2016 performed for shortness of breath, exercised 6 minutes on a Bruce protocol without chest pain or ECG changes. Echo showed no wall motion abnormality concerning for ischemia  echocardiogram revealed normal left ventricular function, with LV ejection fraction greater than 55%. My review of the study showed details of moderate TR, mild to moderately elevated right heart pressures, no mention of his left atrial or right atrial  size, no mention of atrial septal defect  chronic mild lower extremity edema, nonpitting , per the notes felt to be secondary to venous insufficiency   negative cardiac cath 2011   Sleep apnea   PMH :   has a past medical history of Allergy, Arthritis, Asthma, Atrial fibrillation ([place]), Back pain, Cellulitis, Gout, History of chicken pox, Hyperlipidemia, Hypertension, Kidney stone, Motion sickness, Sleep apnea, and Venous insufficiency.  PSH:    Past Surgical History:  Procedure Laterality Date   APPENDECTOMY     CARDIAC CATHETERIZATION     CATARACT EXTRACTION W/PHACO Right 12/26/2021   Procedure: CATARACT EXTRACTION PHACO AND INTRAOCULAR LENS PLACEMENT ([place]) RIGHT;  Surgeon: Leandrew Koyanagi, MD;  Location: [facility];  Service: Ophthalmology;  Laterality: Right;  4.81 00:44.4    Current Outpatient Medications  Medication Sig Dispense Refill   amLODipine (NORVASC) 10 MG tablet TAKE 1 TABLET(10 MG) BY MOUTH DAILY 90 tablet 2   docusate sodium (STOOL SOFTENER) 100 MG capsule Take 100 mg by mouth daily.     finasteride (PROSCAR) 5 MG tablet TAKE 1 TABLET(5 MG) BY MOUTH DAILY 90 tablet 1   furosemide (LASIX) 20 MG tablet TAKE 1 TABLET BY MOUTH TWICE DAILY AS NEEDED 180 tablet 2   glucosamine-chondroitin 500-400 MG tablet Take 1 tablet by mouth 3 (three) times daily.     lisinopril (ZESTRIL) 40 MG tablet Take 1 tablet (40 mg total) by mouth daily. 90 tablet 3   lovastatin (MEVACOR) 20 MG tablet TAKE 1 TABLET(20 MG) BY MOUTH AT BEDTIME 90 tablet 2   metoprolol tartrate (LOPRESSOR) 50 MG tablet TAKE 1 TABLET(50 MG) BY MOUTH TWICE  DAILY 180 tablet 1   Multiple Vitamin (MULTIVITAMIN) tablet Take 1 tablet by mouth daily.     Omega-3 Fatty Acids (FISH OIL) 1000 MG CPDR Take by mouth.     rivaroxaban (XARELTO) 20 MG TABS tablet Take 1 tablet (20 mg total) by mouth daily with supper. 30 tablet 11   triamcinolone cream (KENALOG) 0.1 % Apply 1 Application topically 2 (two) times daily.  30 g 0   No current facility-administered medications for this visit.     Allergies:   Patient has no known allergies.   Social History:  The patient  reports that he has quit smoking. He has never used smokeless tobacco. He reports that he does not drink alcohol and does not use drugs.   Family History:   family history includes Diabetes in his father; Hypertension in his mother.    Review of Systems: Review of Systems  Constitutional: Negative.   Respiratory: Negative.    Cardiovascular:  Positive for leg swelling.  Gastrointestinal: Negative.   Musculoskeletal: Negative.   Neurological: Negative.   Psychiatric/Behavioral: Negative.    All other systems reviewed and are negative.   PHYSICAL EXAM: VS:  There were no vitals taken for this visit. , BMI There is no height or weight on file to calculate BMI. Constitutional:  oriented to person, place, and time. No distress.  HENT:  Head: Grossly normal Eyes:  no discharge. No scleral icterus.  Neck: No JVD, no carotid bruits  Cardiovascular: Irregularly irregular no murmurs appreciated Pulmonary/Chest: Clear to auscultation bilaterally, no wheezes or rails Abdominal: Soft.  no distension.  no tenderness.  Musculoskeletal: Normal range of motion Neurological:  normal muscle tone. Coordination normal. No atrophy Skin: Skin warm and dry Psychiatric: normal affect, pleasant   Recent Labs: 03/14/2021: Hemoglobin 15.5; Platelets 176.0; TSH 3.46 01/04/2022: ALT 20; BUN 15; Creatinine, Ser 0.99; Potassium 3.8; Sodium 142    Lipid Panel Lab Results  Component Value Date   CHOL 102 01/04/2022   HDL 32.80 (L) 01/04/2022   LDLCALC 50 01/04/2022   TRIG 94.0 01/04/2022      Wt Readings from Last 3 Encounters:  01/22/22 297 lb (134.7 kg)  12/26/21 293 lb (132.9 kg)  10/31/21 294 lb 6.4 oz (133.5 kg)     ASSESSMENT AND PLAN: Chronic atrial fibrillation ([facility]) -  Tolerating anticoagulation, rate well controlled CHADS VASC  4 Continue current medications metoprolol 50 twice daily  Essential hypertension - Blood pressure is well controlled on today's visit. No changes made to the medications.  Hypercholesterolemia Cholesterol is at goal on the current lipid regimen. No changes to the medications were made.  Venous insufficiency Prior history of vein surgery May 2019 with cellulitis Wears compression hose Potentially could stop amlodipine if symptoms get worse, he has declined change at this time  Encounter for anticoagulation discussion and counseling Tolerating warfarin, does not want to change  Discussed other medications going generic in the next year, Pradaxa  Obstructive sleep apnea On CPAP, compliant  Pulmonary hypertension  Previously with elevated right heart pressures seen on echocardiogram outside our office . Pressure 45  mmHg  On Lasix 40 daily, stable breathing, leg swelling  Non-rheumatic tricuspid valve insufficiency moderate TR per the previous echo,  Previously declined echo No significant murmur on exam  Morbid obesity ([place]) We have encouraged continued exercise, careful diet management in an effort to lose weight.recommended low carbohydrate diet  Atrial septal defect  no  mention of atrial septal defect on limited echocardiogram done  at outside office.  Previously declined echo   Total encounter time more than 25 minutes  Greater than 50% was spent in counseling and coordination of care with the patient     No orders of the defined types were placed in this encounter.    Signed, Esmond Plants, M.D., Ph.D. 03/09/2022  [place], [place]

## 2022-03-10 ENCOUNTER — Other Ambulatory Visit: Payer: Self-pay | Admitting: Cardiovascular Disease

## 2022-03-11 ENCOUNTER — Encounter: Payer: Self-pay | Admitting: Cardiovascular Disease

## 2022-03-11 ENCOUNTER — Ambulatory Visit: Payer: Medicare Other | Attending: Cardiovascular Disease | Admitting: Cardiovascular Disease

## 2022-03-11 VITALS — BP 138/86 | HR 79 | Ht 73.0 in | Wt 297.5 lb

## 2022-03-11 DIAGNOSIS — E782 Mixed hyperlipidemia: Secondary | ICD-10-CM | POA: Diagnosis not present

## 2022-03-11 DIAGNOSIS — I872 Venous insufficiency (chronic) (peripheral): Secondary | ICD-10-CM | POA: Diagnosis not present

## 2022-03-11 DIAGNOSIS — Q2111 Secundum atrial septal defect: Secondary | ICD-10-CM | POA: Diagnosis not present

## 2022-03-11 DIAGNOSIS — I272 Pulmonary hypertension, unspecified: Secondary | ICD-10-CM | POA: Insufficient documentation

## 2022-03-11 DIAGNOSIS — I10 Essential (primary) hypertension: Secondary | ICD-10-CM | POA: Diagnosis not present

## 2022-03-11 DIAGNOSIS — I482 Chronic atrial fibrillation, unspecified: Secondary | ICD-10-CM | POA: Diagnosis not present

## 2022-03-11 MED ORDER — CARVEDILOL 25 MG PO TABS: 25.0000 mg | ORAL_TABLET | Freq: Two times a day (BID) | ORAL | 3 refills | Status: DC

## 2022-03-11 MED ORDER — AMLODIPINE BESYLATE 10 MG PO TABS: 10.0000 mg | ORAL_TABLET | Freq: Every day | ORAL | 3 refills | Status: DC

## 2022-03-11 MED ORDER — FUROSEMIDE 40 MG PO TABS: ORAL_TABLET | ORAL | 3 refills | Status: DC

## 2022-03-11 MED ORDER — LOVASTATIN 20 MG PO TABS: 20.0000 mg | ORAL_TABLET | Freq: Every day | ORAL | 3 refills | Status: DC

## 2022-03-11 NOTE — Patient Instructions (Signed)
Medication Instructions:  Please hold the metoprolol Change to coreg 25 mg twice a day  Monitor blood pressure  If you need a refill on your cardiac medications before your next appointment, please call your pharmacy.   Lab work: No new labs needed  Testing/Procedures: No new testing needed  Follow-Up: At [facility], you and your health needs are our priority.  As part of our continuing mission to provide you with exceptional heart care, we have created designated Provider Care Teams.  These Care Teams include your primary Cardiologist (physician) and Advanced Practice Providers (APPs -  Physician Assistants and Nurse Practitioners) who all work together to provide you with the care you need, when you need it.  You will need a follow up appointment in 12 months  Providers on your designated Care Team:   Murray Hodgkins, NP Christell Faith, PA-C Cadence Kathlen Mody, [place]  COVID-19 Vaccine Information can be found at: [email or web address].uk For questions related to vaccine distribution or appointments, please email [email or web address] or call 8304496497.

## 2022-03-12 ENCOUNTER — Encounter: Payer: Self-pay | Admitting: Internal Medicine

## 2022-03-27 ENCOUNTER — Other Ambulatory Visit: Payer: Self-pay

## 2022-03-27 MED ORDER — LISINOPRIL 40 MG PO TABS: 40.0000 mg | ORAL_TABLET | Freq: Every day | ORAL | 3 refills | Status: DC

## 2022-04-23 ENCOUNTER — Other Ambulatory Visit: Payer: Medicare Other

## 2022-04-23 DIAGNOSIS — Z03818 Encounter for observation for suspected exposure to other biological agents ruled out: Secondary | ICD-10-CM | POA: Diagnosis not present

## 2022-04-23 DIAGNOSIS — R509 Fever, unspecified: Secondary | ICD-10-CM | POA: Diagnosis not present

## 2022-04-23 DIAGNOSIS — D72829 Elevated white blood cell count, unspecified: Secondary | ICD-10-CM | POA: Diagnosis not present

## 2022-04-25 ENCOUNTER — Telehealth: Payer: Self-pay | Admitting: Internal Medicine

## 2022-04-25 ENCOUNTER — Ambulatory Visit (INDEPENDENT_AMBULATORY_CARE_PROVIDER_SITE_OTHER): Payer: Medicare Other | Admitting: Internal Medicine

## 2022-04-25 ENCOUNTER — Encounter: Payer: Self-pay | Admitting: Internal Medicine

## 2022-04-25 VITALS — BP 134/90 | HR 64 | Temp 98.4°F | Resp 17 | Ht 73.0 in | Wt 304.0 lb

## 2022-04-25 DIAGNOSIS — I272 Pulmonary hypertension, unspecified: Secondary | ICD-10-CM | POA: Diagnosis not present

## 2022-04-25 DIAGNOSIS — I4891 Unspecified atrial fibrillation: Secondary | ICD-10-CM

## 2022-04-25 DIAGNOSIS — D6869 Other thrombophilia: Secondary | ICD-10-CM

## 2022-04-25 DIAGNOSIS — L03115 Cellulitis of right lower limb: Secondary | ICD-10-CM | POA: Diagnosis not present

## 2022-04-25 DIAGNOSIS — G4730 Sleep apnea, unspecified: Secondary | ICD-10-CM

## 2022-04-25 DIAGNOSIS — R319 Hematuria, unspecified: Secondary | ICD-10-CM

## 2022-04-25 DIAGNOSIS — E7800 Pure hypercholesterolemia, unspecified: Secondary | ICD-10-CM

## 2022-04-25 DIAGNOSIS — R739 Hyperglycemia, unspecified: Secondary | ICD-10-CM

## 2022-04-25 DIAGNOSIS — I10 Essential (primary) hypertension: Secondary | ICD-10-CM | POA: Diagnosis not present

## 2022-04-25 MED ORDER — DOXYCYCLINE HYCLATE 100 MG PO TABS: 100.0000 mg | ORAL_TABLET | Freq: Two times a day (BID) | ORAL | 0 refills | Status: DC

## 2022-04-25 NOTE — Telephone Encounter (Signed)
My chart message sent for update.  

## 2022-04-25 NOTE — Progress Notes (Signed)
Patient ID: Michael Blevins, male   DOB: [birth year], 71 y.o.   MRN: [id number]   Subjective:    Patient ID: Michael Blevins, male    DOB: [birth year], 71 y.o.   MRN: [id number]    HPI With past history of afib, hypercholesterolemia and hypertension.  Was scheduled for physical.  Given acute concerns, visit changed to f/u appt.   Seen in acute care (04/23/22)  - fever.  Symptoms started 04/22/22 - chills.  Slept the next am - not feel well.  To acute care.  Blood pressure low.  No sore throat.  No chest congestion.  No cough.  Wbc elevated. Started on doxycycline. Comes in today - reports leg - red - c/w cellulitis.  Has started feeling better.  No fever yesterday.  Some decreased appetite.  Is eating.  Discussed staying hydrated.  Saw Dr Rockey Situ - 03/11/22.  F/u afib.  On xarelto.  Changed to carvedilol.  No chest pain or increased heart rate.  Breathing stable.  Seen 03/08/22 - hematuria.  Treated with cefdinir. Has not noticed blood in urine since.  No urinary symptoms.    Past Medical History:  Diagnosis Date   Allergy    cats and pollen   Arthritis    Asthma    Atrial fibrillation ([place])    Back pain    Cellulitis    Gout    History of chicken pox    Hyperlipidemia    Hypertension    Kidney stone    Motion sickness    Sleep apnea    Venous insufficiency    Past Surgical History:  Procedure Laterality Date   APPENDECTOMY     CARDIAC CATHETERIZATION     CATARACT EXTRACTION W/PHACO Right 12/26/2021   Procedure: CATARACT EXTRACTION PHACO AND INTRAOCULAR LENS PLACEMENT ([place]) RIGHT;  Surgeon: Leandrew Koyanagi, MD;  Location: [facility];  Service: Ophthalmology;  Laterality: Right;  4.81 00:44.4   Family History  Problem Relation Age of Onset   Hypertension Mother    Diabetes Father    Kidney disease Neg Hx    Prostate cancer Neg Hx    Social History   Socioeconomic History   Marital status: Married    Spouse name: Not on file   Number of children: Not on file    Years of education: Not on file   Highest education level: Not on file  Occupational History   Occupation: retired  Tobacco Use   Smoking status: Former   Smokeless tobacco: Never   Tobacco comments:    quit 33 years  Vaping Use   Vaping Use: Never used  Substance and Sexual Activity   Alcohol use: No    Alcohol/week: 0.0 standard drinks of alcohol   Drug use: No   Sexual activity: Not on file  Other Topics Concern   Not on file  Social History Narrative   Not on file   Social Determinants of Health   Financial Resource Strain: Not on file  Food Insecurity: Not on file  Transportation Needs: Not on file  Physical Activity: Not on file  Stress: Not on file  Social Connections: Not on file     Review of Systems  Constitutional:  Negative for unexpected weight change.       Previous fever. Some decreased appetite.   HENT:  Negative for congestion, sinus pressure and sore throat.   Eyes:  Negative for pain and visual disturbance.  Respiratory:  Negative for cough, chest tightness and shortness  of breath.   Cardiovascular:  Negative for chest pain and palpitations.       No increased swelling.   Gastrointestinal:  Negative for abdominal pain, diarrhea, nausea and vomiting.  Genitourinary:  Negative for difficulty urinating and dysuria.  Musculoskeletal:  Negative for joint swelling and myalgias.  Skin:        Increased erythema - lower leg - left.    Neurological:  Negative for dizziness and headaches.  Hematological:  Negative for adenopathy. Does not bruise/bleed easily.  Psychiatric/Behavioral:  Negative for agitation and dysphoric mood.        Objective:     BP (!) 134/90 (BP Location: Left Arm, Patient Position: Sitting, Cuff Size: Large)   Pulse 64   Temp 98.4 F (36.9 C) (Temporal)   Resp 17   Ht _0  (1.854 m)   Wt (!) 304 lb (137.9 kg)   SpO2 96%   BMI 40.11 kg/m  Wt Readings from Last 3 Encounters:  04/25/22 (!) 304 lb (137.9 kg)  03/11/22 297  lb 8 oz (134.9 kg)  01/22/22 297 lb (134.7 kg)    Physical Exam Constitutional:      General: He is not in acute distress.    Appearance: Normal appearance. He is well-developed.  HENT:     Head: Normocephalic and atraumatic.     Right Ear: External ear normal.     Left Ear: External ear normal.  Eyes:     General: No scleral icterus.       Right eye: No discharge.        Left eye: No discharge.     Conjunctiva/sclera: Conjunctivae normal.  Neck:     Thyroid: No thyromegaly.  Cardiovascular:     Rate and Rhythm: Normal rate and regular rhythm.  Pulmonary:     Effort: No respiratory distress.     Breath sounds: Normal breath sounds. No wheezing.  Abdominal:     General: Bowel sounds are normal.     Palpations: Abdomen is soft.     Tenderness: There is no abdominal tenderness.  Musculoskeletal:        General: No tenderness.     Cervical back: Neck supple. No tenderness.     Comments: No increased swelling.   Lymphadenopathy:     Cervical: No cervical adenopathy.  Skin:    Comments: Increased erythema - left lower leg.   Neurological:     Mental Status: He is alert and oriented to person, place, and time.  Psychiatric:        Mood and Affect: Mood normal.        Behavior: Behavior normal.      Outpatient Encounter Medications as of 04/25/2022  Medication Sig   amLODipine (NORVASC) 10 MG tablet Take 1 tablet (10 mg total) by mouth daily.   carvedilol (COREG) 25 MG tablet Take 1 tablet (25 mg total) by mouth 2 (two) times daily.   docusate sodium (STOOL SOFTENER) 100 MG capsule Take 100 mg by mouth daily.   doxycycline (VIBRA-TABS) 100 MG tablet Take 1 tablet (100 mg total) by mouth 2 (two) times daily.   finasteride (PROSCAR) 5 MG tablet TAKE 1 TABLET(5 MG) BY MOUTH DAILY   furosemide (LASIX) 40 MG tablet TAKE 1 TABLET BY MOUTH DAILY AS NEEDED.   glucosamine-chondroitin 500-400 MG tablet Take 1 tablet by mouth 3 (three) times daily.   lisinopril (ZESTRIL) 40 MG  tablet Take 1 tablet (40 mg total) by mouth daily.   lovastatin (MEVACOR)  20 MG tablet Take 1 tablet (20 mg total) by mouth at bedtime.   Multiple Vitamin (MULTIVITAMIN) tablet Take 1 tablet by mouth daily.   Omega-3 Fatty Acids (FISH OIL) 1000 MG CPDR Take by mouth.   rivaroxaban (XARELTO) 20 MG TABS tablet Take 1 tablet (20 mg total) by mouth daily with supper.   triamcinolone cream (KENALOG) 0.1 % Apply 1 Application topically 2 (two) times daily.   No facility-administered encounter medications on file as of 04/25/2022.     Lab Results  Component Value Date   WBC 8.7 03/14/2021   HGB 15.5 03/14/2021   HCT 46.0 03/14/2021   PLT 176.0 03/14/2021   GLUCOSE 100 (H) 01/04/2022   CHOL 102 01/04/2022   TRIG 94.0 01/04/2022   HDL 32.80 (L) 01/04/2022   LDLDIRECT 77.0 04/06/2018   LDLCALC 50 01/04/2022   ALT 20 01/04/2022   AST 24 01/04/2022   NA 142 01/04/2022   K 3.8 01/04/2022   CL 105 01/04/2022   CREATININE 0.99 01/04/2022   BUN 15 01/04/2022   CO2 29 01/04/2022   TSH 3.46 03/14/2021   PSA 0.47 09/28/2021   INR 3.0 10/24/2021   HGBA1C 5.4 03/14/2021       Assessment & Plan:   Problem List Items Addressed This Visit     Acquired thrombophilia ([place])    Afib. On xarelto.       Apnea, sleep    Uses cpap regularly.        Atrial fibrillation ([place])    Followed by cardiology. On xarelto now.  Stable.  Follow.       Cellulitis    Left lower extremity.  Recent fever/chills as outlined.  Continue doxycycline.  Starting to feel better.  Let is better.  Follow.  Call with update.       Essential hypertension - Primary    On carvedilol, amlodipine and lisinopril. Blood pressure as outlined.  Improved from previous. Spot check pressure.  Follow met b.      Hematuria    Has been worked up by urology previously.  On finasteride.  Recent hematuria.  Treated for infection.  Recheck urine to confirm clear. Follow.        Hypercholesterolemia    On lovastatin.  Low  cholesterol diet and exercise.  Follow lipid panel and liver function tests.       Hyperglycemia    Low carb diet and exercise.  Follow met b and a1c.       Pulmonary hypertension ([place])    Has been evaluated by cardiology.  Using cpap.  Breathing stable.         Einar Pheasant, MD

## 2022-05-05 ENCOUNTER — Encounter: Payer: Self-pay | Admitting: Internal Medicine

## 2022-05-05 NOTE — Assessment & Plan Note (Signed)
On carvedilol, amlodipine and lisinopril. Blood pressure as outlined.  Improved from previous. Spot check pressure.  Follow met b.

## 2022-05-05 NOTE — Assessment & Plan Note (Signed)
Afib. On xarelto.

## 2022-05-05 NOTE — Assessment & Plan Note (Signed)
Uses cpap regularly.

## 2022-05-05 NOTE — Assessment & Plan Note (Signed)
On lovastatin.  Low cholesterol diet and exercise.  Follow lipid panel and liver function tests.   

## 2022-05-05 NOTE — Assessment & Plan Note (Signed)
Has been evaluated by cardiology.  Using cpap.  Breathing stable.

## 2022-05-05 NOTE — Assessment & Plan Note (Signed)
Followed by cardiology. On xarelto now.  Stable.  Follow.

## 2022-05-05 NOTE — Assessment & Plan Note (Signed)
Left lower extremity.  Recent fever/chills as outlined.  Continue doxycycline.  Starting to feel better.  Let is better.  Follow.  Call with update.

## 2022-05-05 NOTE — Assessment & Plan Note (Signed)
Low carb diet and exercise.  Follow met b and a1c.  

## 2022-05-05 NOTE — Assessment & Plan Note (Signed)
Has been worked up by urology previously.  On finasteride.  Recent hematuria.  Treated for infection.  Recheck urine to confirm clear. Follow.

## 2022-05-13 ENCOUNTER — Encounter: Payer: Self-pay | Admitting: Podiatry

## 2022-05-13 ENCOUNTER — Ambulatory Visit (INDEPENDENT_AMBULATORY_CARE_PROVIDER_SITE_OTHER): Payer: Medicare Other | Admitting: Podiatry

## 2022-05-13 DIAGNOSIS — L600 Ingrowing nail: Secondary | ICD-10-CM | POA: Diagnosis not present

## 2022-05-13 MED ORDER — NEOMYCIN-POLYMYXIN-HC 1 % OT SOLN: OTIC | 1 refills | Status: DC

## 2022-05-13 NOTE — Progress Notes (Signed)
Subjective:  Patient ID: Michael Blevins, male    DOB: Feb 22, 1951,  MRN: [id number] HPI Chief Complaint  Patient presents with   Toe Pain    Hallux left - medial border, tender and red x 1 month, Dr. Amalia Hailey fixed it in 2018   New Patient (Initial Visit)    Est pt 2018    71 y.o. male presents with the above complaint.   ROS: Denies fever chills nausea vomit muscle aches pains calf pain back pain chest pain shortness of breath.  Past Medical History:  Diagnosis Date   Allergy    cats and pollen   Arthritis    Asthma    Atrial fibrillation ([place])    Back pain    Cellulitis    Gout    History of chicken pox    Hyperlipidemia    Hypertension    Kidney stone    Motion sickness    Sleep apnea    Venous insufficiency    Past Surgical History:  Procedure Laterality Date   APPENDECTOMY     CARDIAC CATHETERIZATION     CATARACT EXTRACTION W/PHACO Right 12/26/2021   Procedure: CATARACT EXTRACTION PHACO AND INTRAOCULAR LENS PLACEMENT ([place]) RIGHT;  Surgeon: Leandrew Koyanagi, MD;  Location: [facility];  Service: Ophthalmology;  Laterality: Right;  4.81 00:44.4    Current Outpatient Medications:    brimonidine (ALPHAGAN) 0.2 % ophthalmic solution, Place 1 drop into the right eye 2 (two) times daily., Disp: , Rfl:    NEOMYCIN-POLYMYXIN-HYDROCORTISONE (CORTISPORIN) 1 % SOLN OTIC solution, Apply 1-2 drops to toe BID after soaking, Disp: 10 mL, Rfl: 1   amLODipine (NORVASC) 10 MG tablet, Take 1 tablet (10 mg total) by mouth daily., Disp: 90 tablet, Rfl: 3   carvedilol (COREG) 25 MG tablet, Take 1 tablet (25 mg total) by mouth 2 (two) times daily., Disp: 180 tablet, Rfl: 3   docusate sodium (STOOL SOFTENER) 100 MG capsule, Take 100 mg by mouth daily., Disp: , Rfl:    doxycycline (VIBRA-TABS) 100 MG tablet, Take 1 tablet (100 mg total) by mouth 2 (two) times daily., Disp: 20 tablet, Rfl: 0   finasteride (PROSCAR) 5 MG tablet, TAKE 1 TABLET(5 MG) BY MOUTH DAILY, Disp: 90 tablet,  Rfl: 1   furosemide (LASIX) 40 MG tablet, TAKE 1 TABLET BY MOUTH DAILY AS NEEDED., Disp: 90 tablet, Rfl: 3   glucosamine-chondroitin 500-400 MG tablet, Take 1 tablet by mouth 3 (three) times daily., Disp: , Rfl:    lisinopril (ZESTRIL) 40 MG tablet, Take 1 tablet (40 mg total) by mouth daily., Disp: 90 tablet, Rfl: 3   lovastatin (MEVACOR) 20 MG tablet, Take 1 tablet (20 mg total) by mouth at bedtime., Disp: 90 tablet, Rfl: 3   Multiple Vitamin (MULTIVITAMIN) tablet, Take 1 tablet by mouth daily., Disp: , Rfl:    Omega-3 Fatty Acids (FISH OIL) 1000 MG CPDR, Take by mouth., Disp: , Rfl:    rivaroxaban (XARELTO) 20 MG TABS tablet, Take 1 tablet (20 mg total) by mouth daily with supper., Disp: 30 tablet, Rfl: 11   triamcinolone cream (KENALOG) 0.1 %, Apply 1 Application topically 2 (two) times daily., Disp: 30 g, Rfl: 0  No Known Allergies Review of Systems Objective:  There were no vitals filed for this visit.  General: Well developed, nourished, in no acute distress, alert and oriented x3   Dermatological: Skin is warm, dry and supple bilateral. Nails x 10 are well maintained; remaining integument appears unremarkable at this time. There are  no open sores, no preulcerative lesions, no rash or signs of infection present.  Vascular: Dorsalis Pedis artery and Posterior Tibial artery pedal pulses are 2/4 bilateral with immedate capillary fill time. Pedal hair growth present. No varicosities and no lower extremity edema present bilateral.   Neruologic: Grossly intact via light touch bilateral. Vibratory intact via tuning fork bilateral. Protective threshold with Semmes Wienstein monofilament intact to all pedal sites bilateral. Patellar and Achilles deep tendon reflexes 2+ bilateral. No Babinski or clonus noted bilateral.   Musculoskeletal: No gross boney pedal deformities bilateral. No pain, crepitus, or limitation noted with foot and ankle range of motion bilateral. Muscular strength 5/5 in all  groups tested bilateral.  Gait: Unassisted, Nonantalgic.    Radiographs:  None taken  Assessment & Plan:   Assessment: Ingrown toenail tibial border hallux left  Plan: Chemical matricectomy was performed today tolerated procedure well after local anesthetic was administered.  He was given both oral and written home-going instruction for care and soaking of the toe.  He was also provided a prescription for Cortisporin Otic to be applied twice daily after soaking.  He will follow-up with me in 2 to 3 weeks.  Should he have questions or concerns regarding this he will notify us immediately.     Idrissa Beville T. [place], [place]

## 2022-05-13 NOTE — Patient Instructions (Signed)

## 2022-05-27 ENCOUNTER — Ambulatory Visit (INDEPENDENT_AMBULATORY_CARE_PROVIDER_SITE_OTHER): Payer: Medicare Other | Admitting: Podiatry

## 2022-05-27 DIAGNOSIS — L600 Ingrowing nail: Secondary | ICD-10-CM

## 2022-05-27 NOTE — Progress Notes (Signed)
He presents today for follow-up of his matrixectomy hallux left.  States that it feels great and is doing just fine.  States that he may need to come in at some point for new orthotics.  Objective: Vital signs stable alert oriented x 3 there is no erythema edema cellulitis drainage or odor toe appears to be healing very nicely.  Assessment: Well-healing surgical foot.  Plan: Covered in the day and leave open at bedtime.  Follow-up with me as needed or if the wound does not heal completely in 2 weeks

## 2022-06-08 ENCOUNTER — Other Ambulatory Visit: Payer: Self-pay | Admitting: Internal Medicine

## 2022-06-27 ENCOUNTER — Encounter: Payer: Self-pay | Admitting: Internal Medicine

## 2022-06-27 ENCOUNTER — Ambulatory Visit (INDEPENDENT_AMBULATORY_CARE_PROVIDER_SITE_OTHER): Payer: Medicare Other | Admitting: Internal Medicine

## 2022-06-27 VITALS — BP 132/70 | HR 90 | Temp 98.3°F | Resp 16 | Ht 73.0 in | Wt 299.0 lb

## 2022-06-27 DIAGNOSIS — D582 Other hemoglobinopathies: Secondary | ICD-10-CM

## 2022-06-27 DIAGNOSIS — D6869 Other thrombophilia: Secondary | ICD-10-CM | POA: Diagnosis not present

## 2022-06-27 DIAGNOSIS — Z0000 Encounter for general adult medical examination without abnormal findings: Secondary | ICD-10-CM

## 2022-06-27 DIAGNOSIS — R739 Hyperglycemia, unspecified: Secondary | ICD-10-CM

## 2022-06-27 DIAGNOSIS — R319 Hematuria, unspecified: Secondary | ICD-10-CM | POA: Diagnosis not present

## 2022-06-27 DIAGNOSIS — I4891 Unspecified atrial fibrillation: Secondary | ICD-10-CM | POA: Diagnosis not present

## 2022-06-27 DIAGNOSIS — I10 Essential (primary) hypertension: Secondary | ICD-10-CM | POA: Diagnosis not present

## 2022-06-27 DIAGNOSIS — G4730 Sleep apnea, unspecified: Secondary | ICD-10-CM

## 2022-06-27 DIAGNOSIS — I272 Pulmonary hypertension, unspecified: Secondary | ICD-10-CM | POA: Diagnosis not present

## 2022-06-27 DIAGNOSIS — L03115 Cellulitis of right lower limb: Secondary | ICD-10-CM | POA: Diagnosis not present

## 2022-06-27 DIAGNOSIS — R21 Rash and other nonspecific skin eruption: Secondary | ICD-10-CM

## 2022-06-27 DIAGNOSIS — E7800 Pure hypercholesterolemia, unspecified: Secondary | ICD-10-CM

## 2022-06-27 NOTE — Progress Notes (Addendum)
Subjective:    Patient ID: Michael Blevins, male    DOB: [birth year], 72 y.o.   MRN: [id number]  Patient here for  Chief Complaint  Patient presents with   Annual Exam    HPI With past history of afib, hypercholesterolemia and hypertension, he comes in today to follow up on these issues as well as for a complete physical exam.  Recently treated for cellulitis.  Resolved.  Legs doing well.  Does have rash - top of feet.  Appears to be c/w dermatitis.  Discussed cortisone cream.  No chest pain or sob reported.  No increased cough or congestion.   Using cpap.  Seeing Dr Milinda Pointer - ingrown toenail.  S/p chemical matricectomy. Doing well.  Saw Dr Rockey Situ 03/11/22 - recommended (for better blood pressure control) to change metoprolol to carvedilol.  Blood pressure doing better.  On xarelto.  Had questions about the cost.     Past Medical History:  Diagnosis Date   Allergy    cats and pollen   Arthritis    Asthma    Atrial fibrillation ([place])    Back pain    Cellulitis    Gout    History of chicken pox    Hyperlipidemia    Hypertension    Kidney stone    Motion sickness    Sleep apnea    Venous insufficiency    Past Surgical History:  Procedure Laterality Date   APPENDECTOMY     CARDIAC CATHETERIZATION     CATARACT EXTRACTION W/PHACO Right 12/26/2021   Procedure: CATARACT EXTRACTION PHACO AND INTRAOCULAR LENS PLACEMENT ([place]) RIGHT;  Surgeon: Leandrew Koyanagi, MD;  Location: [facility];  Service: Ophthalmology;  Laterality: Right;  4.81 00:44.4   Family History  Problem Relation Age of Onset   Hypertension Mother    Diabetes Father    Kidney disease Neg Hx    Prostate cancer Neg Hx    Social History   Socioeconomic History   Marital status: Married    Spouse name: Not on file   Number of children: Not on file   Years of education: Not on file   Highest education level: Not on file  Occupational History   Occupation: retired  Tobacco Use   Smoking status:  Former   Smokeless tobacco: Never   Tobacco comments:    quit 33 years  Vaping Use   Vaping Use: Never used  Substance and Sexual Activity   Alcohol use: No    Alcohol/week: 0.0 standard drinks of alcohol   Drug use: No   Sexual activity: Not on file  Other Topics Concern   Not on file  Social History Narrative   Not on file   Social Determinants of Health   Financial Resource Strain: Not on file  Food Insecurity: Not on file  Transportation Needs: Not on file  Physical Activity: Not on file  Stress: Not on file  Social Connections: Not on file     Review of Systems  Constitutional:  Negative for appetite change and unexpected weight change.  HENT:  Negative for congestion, sinus pressure and sore throat.   Eyes:  Negative for pain and visual disturbance.  Respiratory:  Negative for cough, chest tightness and shortness of breath.   Cardiovascular:  Negative for chest pain and palpitations.       No increased swelling.   Gastrointestinal:  Negative for abdominal pain, diarrhea, nausea and vomiting.  Genitourinary:  Negative for difficulty urinating and dysuria.  Musculoskeletal:  Negative for joint swelling and myalgias.  Skin:  Negative for color change.       Rash - top of feet.    Neurological:  Negative for dizziness and headaches.  Hematological:  Negative for adenopathy. Does not bruise/bleed easily.  Psychiatric/Behavioral:  Negative for agitation and dysphoric mood.        Objective:     BP 132/70   Pulse 90   Temp 98.3 F (36.8 C)   Resp 16   Ht '6\' 1"'$  (1.854 m)   Wt 299 lb (135.6 kg)   SpO2 98%   BMI 39.45 kg/m  Wt Readings from Last 3 Encounters:  06/27/22 299 lb (135.6 kg)  04/25/22 (!) 304 lb (137.9 kg)  03/11/22 297 lb 8 oz (134.9 kg)    Physical Exam Constitutional:      General: He is not in acute distress.    Appearance: Normal appearance. He is well-developed.  HENT:     Head: Normocephalic and atraumatic.     Right Ear: External  ear normal.     Left Ear: External ear normal.  Eyes:     General: No scleral icterus.       Right eye: No discharge.        Left eye: No discharge.     Conjunctiva/sclera: Conjunctivae normal.  Neck:     Thyroid: No thyromegaly.  Cardiovascular:     Rate and Rhythm: Normal rate and regular rhythm.  Pulmonary:     Effort: No respiratory distress.     Breath sounds: Normal breath sounds. No wheezing.  Abdominal:     General: Bowel sounds are normal.     Palpations: Abdomen is soft.     Tenderness: There is no abdominal tenderness.  Musculoskeletal:        General: No tenderness.     Cervical back: Neck supple. No tenderness.     Comments: No increased swelling.    Lymphadenopathy:     Cervical: No cervical adenopathy.  Skin:    Findings: No erythema.     Comments: Rash - top of feet.    Neurological:     Mental Status: He is alert and oriented to person, place, and time.  Psychiatric:        Mood and Affect: Mood normal.        Behavior: Behavior normal.      Outpatient Encounter Medications as of 06/27/2022  Medication Sig   amLODipine (NORVASC) 10 MG tablet Take 1 tablet (10 mg total) by mouth daily.   brimonidine (ALPHAGAN) 0.2 % ophthalmic solution Place 1 drop into the right eye 2 (two) times daily.   carvedilol (COREG) 25 MG tablet Take 1 tablet (25 mg total) by mouth 2 (two) times daily.   docusate sodium (STOOL SOFTENER) 100 MG capsule Take 100 mg by mouth daily.   finasteride (PROSCAR) 5 MG tablet TAKE 1 TABLET(5 MG) BY MOUTH DAILY   furosemide (LASIX) 40 MG tablet TAKE 1 TABLET BY MOUTH DAILY AS NEEDED.   glucosamine-chondroitin 500-400 MG tablet Take 1 tablet by mouth 3 (three) times daily.   lisinopril (ZESTRIL) 40 MG tablet Take 1 tablet (40 mg total) by mouth daily.   lovastatin (MEVACOR) 20 MG tablet Take 1 tablet (20 mg total) by mouth at bedtime.   Multiple Vitamin (MULTIVITAMIN) tablet Take 1 tablet by mouth daily.   NEOMYCIN-POLYMYXIN-HYDROCORTISONE  (CORTISPORIN) 1 % SOLN OTIC solution Apply 1-2 drops to toe BID after soaking   Omega-3 Fatty Acids (FISH  OIL) 1000 MG CPDR Take by mouth.   rivaroxaban (XARELTO) 20 MG TABS tablet Take 1 tablet (20 mg total) by mouth daily with supper.   triamcinolone cream (KENALOG) 0.1 % Apply 1 Application topically 2 (two) times daily.   [DISCONTINUED] doxycycline (VIBRA-TABS) 100 MG tablet Take 1 tablet (100 mg total) by mouth 2 (two) times daily.   No facility-administered encounter medications on file as of 06/27/2022.     Lab Results  Component Value Date   WBC 8.7 03/14/2021   HGB 15.5 03/14/2021   HCT 46.0 03/14/2021   PLT 176.0 03/14/2021   GLUCOSE 100 (H) 01/04/2022   CHOL 102 01/04/2022   TRIG 94.0 01/04/2022   HDL 32.80 (L) 01/04/2022   LDLDIRECT 77.0 04/06/2018   LDLCALC 50 01/04/2022   ALT 20 01/04/2022   AST 24 01/04/2022   NA 142 01/04/2022   K 3.8 01/04/2022   CL 105 01/04/2022   CREATININE 0.99 01/04/2022   BUN 15 01/04/2022   CO2 29 01/04/2022   TSH 3.46 03/14/2021   PSA 0.47 09/28/2021   INR 3.0 10/24/2021   HGBA1C 5.4 03/14/2021    No results found.     Assessment & Plan:  Health care maintenance Assessment & Plan: Physical today 06/27/22.  Followed by urology for prostate checks.  PSA 09/2021 -  .47. cologuard -  12/28/19 - negative.    Atrial fibrillation, unspecified type [facility]) Assessment & Plan: Followed by cardiology. On xarelto.  Discussed cost.  Refer for medication assistance.  Metoprolol changed to carvedilol.  Blood pressure doing well.  Follow.   Orders: -     AMB Referral to Pharmacy Medication Management  Acquired thrombophilia ([place]) Assessment & Plan: Afib. On xarelto.    Sleep apnea, unspecified type Assessment & Plan: Uses cpap regularly.     Cellulitis of right lower extremity Assessment & Plan: Resolved.    Elevated hemoglobin ([facility]) Assessment & Plan: Being treated for OSA. Using cpap regularly.  Follow cbc.    Essential  hypertension Assessment & Plan: On carvedilol, amlodipine and lisinopril. Blood pressure as outlined.  Spot check pressure.  Follow met b.   Hematuria, unspecified type Assessment & Plan: Has been worked up by urology previously.  On finasteride.  Recent hematuria.  Treated for infection.  Recheck urine to confirm clear. Follow.     Hypercholesterolemia Assessment & Plan: On lovastatin.  Low cholesterol diet and exercise.  Follow lipid panel and liver function tests.    Hyperglycemia Assessment & Plan: Low carb diet and exercise.  Follow met b and a1c.    Pulmonary hypertension ([place]) Assessment & Plan: Has been evaluated by cardiology.  Using cpap.  Breathing stable.    Rash Assessment & Plan: Feet.  Discussed cortisone cream.  Call with update.       Einar Pheasant, MD

## 2022-06-27 NOTE — Assessment & Plan Note (Signed)
Physical today 06/27/22.  Followed by urology for prostate checks.  PSA 09/2021 -  .47. cologuard -  12/28/19 - negative.

## 2022-06-28 ENCOUNTER — Telehealth: Payer: Self-pay | Admitting: Pharmacist

## 2022-06-28 NOTE — Progress Notes (Signed)
Contacted patient regarding referral for medication access from Einar Pheasant, MD .   MyChart message sent  Catie TJodi Mourning, PharmD, [facility] [phone]

## 2022-06-30 ENCOUNTER — Encounter: Payer: Self-pay | Admitting: Internal Medicine

## 2022-06-30 DIAGNOSIS — R21 Rash and other nonspecific skin eruption: Secondary | ICD-10-CM | POA: Insufficient documentation

## 2022-06-30 NOTE — Assessment & Plan Note (Signed)
Has been evaluated by cardiology.  Using cpap.  Breathing stable.

## 2022-06-30 NOTE — Assessment & Plan Note (Signed)
Low carb diet and exercise.  Follow met b and a1c.  

## 2022-06-30 NOTE — Assessment & Plan Note (Signed)
Resolved

## 2022-06-30 NOTE — Assessment & Plan Note (Signed)
Followed by cardiology. On xarelto.  Discussed cost.  Refer for medication assistance.  Metoprolol changed to carvedilol.  Blood pressure doing well.  Follow.

## 2022-06-30 NOTE — Assessment & Plan Note (Signed)
Uses cpap regularly.

## 2022-06-30 NOTE — Assessment & Plan Note (Signed)
On lovastatin.  Low cholesterol diet and exercise.  Follow lipid panel and liver function tests.   

## 2022-06-30 NOTE — Assessment & Plan Note (Signed)
Being treated for OSA. Using cpap regularly.  Follow cbc.

## 2022-06-30 NOTE — Assessment & Plan Note (Signed)
Afib. On xarelto.

## 2022-06-30 NOTE — Assessment & Plan Note (Signed)
On carvedilol, amlodipine and lisinopril. Blood pressure as outlined.  Spot check pressure.  Follow met b.

## 2022-06-30 NOTE — Assessment & Plan Note (Signed)
Has been worked up by urology previously.  On finasteride.  Recent hematuria.  Treated for infection.  Recheck urine to confirm clear. Follow.

## 2022-06-30 NOTE — Assessment & Plan Note (Signed)
Feet.  Discussed cortisone cream.  Call with update.

## 2022-07-02 NOTE — Progress Notes (Signed)
Contacted patient regarding referral for medication access from Einar Pheasant, MD .   Left patient a voicemail to return my call at their [place], PharmD, [place] [phone]

## 2022-07-08 NOTE — Telephone Encounter (Signed)
Contacted patient. He notes that he already called his insurance; confirmed his initial cost in January was his deductible, which he has since paid through and monthly copays are $40. He also notes that it should remain $40 while in Coverage Gap. Discussed Engineer, maintenance program if cost does change.   Closing referral as needs are met.   Catie Hedwig Morton, PharmD, [place], [facility] [phone]

## 2022-07-08 NOTE — Telephone Encounter (Signed)
Thank you Catie.  I appreciate it.

## 2022-07-11 ENCOUNTER — Other Ambulatory Visit (INDEPENDENT_AMBULATORY_CARE_PROVIDER_SITE_OTHER): Payer: Medicare Other

## 2022-07-11 DIAGNOSIS — E7800 Pure hypercholesterolemia, unspecified: Secondary | ICD-10-CM

## 2022-07-11 DIAGNOSIS — R739 Hyperglycemia, unspecified: Secondary | ICD-10-CM | POA: Diagnosis not present

## 2022-07-11 DIAGNOSIS — I10 Essential (primary) hypertension: Secondary | ICD-10-CM | POA: Diagnosis not present

## 2022-07-11 LAB — BASIC METABOLIC PANEL
BUN: 11 mg/dL (ref 6–23)
CO2: 30 mEq/L (ref 19–32)
Calcium: 9.2 mg/dL (ref 8.4–10.5)
Chloride: 105 mEq/L (ref 96–112)
Creatinine, Ser: 0.98 mg/dL (ref 0.40–1.50)
GFR: 77.2 mL/min (ref >60.00)
Glucose, Bld: 96 mg/dL (ref 70–99)
Potassium: 3.9 mEq/L (ref 3.5–5.1)
Sodium: 142 mEq/L (ref 135–145)

## 2022-07-11 LAB — LIPID PANEL
Cholesterol: 102 mg/dL (ref 0–200)
HDL: 32 mg/dL — ABNORMAL LOW (ref >39.00)
LDL Cholesterol: 52 mg/dL (ref 0–99)
NonHDL: 69.65
Total CHOL/HDL Ratio: 3
Triglycerides: 87 mg/dL (ref 0.0–149.0)
VLDL: 17.4 mg/dL (ref 0.0–40.0)

## 2022-07-11 LAB — CBC WITH DIFFERENTIAL/PLATELET
Basophils Absolute: 0.1 10*3/uL (ref 0.0–0.1)
Basophils Relative: 0.5 % (ref 0.0–3.0)
Eosinophils Absolute: 0.2 10*3/uL (ref 0.0–0.7)
Eosinophils Relative: 1.8 % (ref 0.0–5.0)
HCT: 45.4 % (ref 39.0–52.0)
Hemoglobin: 15.9 g/dL (ref 13.0–17.0)
Lymphocytes Relative: 15.7 % (ref 12.0–46.0)
Lymphs Abs: 1.7 10*3/uL (ref 0.7–4.0)
MCHC: 35.1 g/dL (ref 30.0–36.0)
MCV: 87.6 fl (ref 78.0–100.0)
Monocytes Absolute: 0.9 10*3/uL (ref 0.1–1.0)
Monocytes Relative: 8.2 % (ref 3.0–12.0)
Neutro Abs: 8 10*3/uL — ABNORMAL HIGH (ref 1.4–7.7)
Neutrophils Relative %: 73.8 % (ref 43.0–77.0)
Platelets: 166 10*3/uL (ref 150.0–400.0)
RBC: 5.18 Mil/uL (ref 4.22–5.81)
RDW: 13.4 % (ref 11.5–15.5)
WBC: 10.9 10*3/uL — ABNORMAL HIGH (ref 4.0–10.5)

## 2022-07-11 LAB — HEPATIC FUNCTION PANEL
ALT: 21 U/L (ref 0–53)
AST: 22 U/L (ref 0–37)
Albumin: 4.1 g/dL (ref 3.5–5.2)
Alkaline Phosphatase: 58 U/L (ref 39–117)
Bilirubin, Direct: 0.3 mg/dL (ref 0.0–0.3)
Total Bilirubin: 1 mg/dL (ref 0.2–1.2)
Total Protein: 6.5 g/dL (ref 6.0–8.3)

## 2022-07-11 LAB — HEMOGLOBIN A1C: Hgb A1c MFr Bld: 5.6 % (ref 4.6–6.5)

## 2022-07-12 ENCOUNTER — Other Ambulatory Visit: Payer: Self-pay

## 2022-07-12 DIAGNOSIS — D696 Thrombocytopenia, unspecified: Secondary | ICD-10-CM

## 2022-07-26 DIAGNOSIS — H401112 Primary open-angle glaucoma, right eye, moderate stage: Secondary | ICD-10-CM | POA: Diagnosis not present

## 2022-08-02 DIAGNOSIS — H43813 Vitreous degeneration, bilateral: Secondary | ICD-10-CM | POA: Diagnosis not present

## 2022-08-02 DIAGNOSIS — H401112 Primary open-angle glaucoma, right eye, moderate stage: Secondary | ICD-10-CM | POA: Diagnosis not present

## 2022-08-02 DIAGNOSIS — H40002 Preglaucoma, unspecified, left eye: Secondary | ICD-10-CM | POA: Diagnosis not present

## 2022-08-02 DIAGNOSIS — Z961 Presence of intraocular lens: Secondary | ICD-10-CM | POA: Diagnosis not present

## 2022-08-13 ENCOUNTER — Other Ambulatory Visit (INDEPENDENT_AMBULATORY_CARE_PROVIDER_SITE_OTHER): Payer: Medicare Other

## 2022-08-13 DIAGNOSIS — D696 Thrombocytopenia, unspecified: Secondary | ICD-10-CM | POA: Diagnosis not present

## 2022-08-13 LAB — CBC WITH DIFFERENTIAL/PLATELET
Basophils Absolute: 0.1 10*3/uL (ref 0.0–0.1)
Basophils Relative: 1.1 % (ref 0.0–3.0)
Eosinophils Absolute: 0.3 10*3/uL (ref 0.0–0.7)
Eosinophils Relative: 2.7 % (ref 0.0–5.0)
HCT: 43.6 % (ref 39.0–52.0)
Hemoglobin: 14.9 g/dL (ref 13.0–17.0)
Lymphocytes Relative: 20.6 % (ref 12.0–46.0)
Lymphs Abs: 1.9 10*3/uL (ref 0.7–4.0)
MCHC: 34.2 g/dL (ref 30.0–36.0)
MCV: 86.8 fl (ref 78.0–100.0)
Monocytes Absolute: 0.8 10*3/uL (ref 0.1–1.0)
Monocytes Relative: 8.9 % (ref 3.0–12.0)
Neutro Abs: 6.3 10*3/uL (ref 1.4–7.7)
Neutrophils Relative %: 66.7 % (ref 43.0–77.0)
Platelets: 193 10*3/uL (ref 150.0–400.0)
RBC: 5.02 Mil/uL (ref 4.22–5.81)
RDW: 13.6 % (ref 11.5–15.5)
WBC: 9.5 10*3/uL (ref 4.0–10.5)

## 2022-08-15 DIAGNOSIS — I872 Venous insufficiency (chronic) (peripheral): Secondary | ICD-10-CM | POA: Diagnosis not present

## 2022-08-15 DIAGNOSIS — L03031 Cellulitis of right toe: Secondary | ICD-10-CM | POA: Diagnosis not present

## 2022-10-14 ENCOUNTER — Ambulatory Visit (INDEPENDENT_AMBULATORY_CARE_PROVIDER_SITE_OTHER): Payer: Medicare Other | Admitting: Internal Medicine

## 2022-10-14 ENCOUNTER — Ambulatory Visit
Admission: RE | Admit: 2022-10-14 | Discharge: 2022-10-14 | Disposition: A | Discharge status: Home / Self Care | Payer: Medicare Other | Attending: Internal Medicine | Admitting: Internal Medicine

## 2022-10-14 ENCOUNTER — Ambulatory Visit
Admission: RE | Admit: 2022-10-14 | Discharge: 2022-10-14 | Disposition: A | Discharge status: Home / Self Care | Payer: Medicare Other | Source: Ambulatory Visit | Attending: Internal Medicine | Admitting: Internal Medicine

## 2022-10-14 ENCOUNTER — Other Ambulatory Visit: Payer: Self-pay

## 2022-10-14 VITALS — BP 128/72 | HR 73 | Temp 98.2°F | Resp 16 | Ht 73.0 in | Wt 302.0 lb

## 2022-10-14 DIAGNOSIS — J811 Chronic pulmonary edema: Secondary | ICD-10-CM | POA: Diagnosis not present

## 2022-10-14 DIAGNOSIS — R0600 Dyspnea, unspecified: Secondary | ICD-10-CM | POA: Diagnosis not present

## 2022-10-14 DIAGNOSIS — E7800 Pure hypercholesterolemia, unspecified: Secondary | ICD-10-CM | POA: Diagnosis not present

## 2022-10-14 DIAGNOSIS — I10 Essential (primary) hypertension: Secondary | ICD-10-CM

## 2022-10-14 DIAGNOSIS — R0789 Other chest pain: Secondary | ICD-10-CM | POA: Insufficient documentation

## 2022-10-14 DIAGNOSIS — D6869 Other thrombophilia: Secondary | ICD-10-CM

## 2022-10-14 DIAGNOSIS — G4733 Obstructive sleep apnea (adult) (pediatric): Secondary | ICD-10-CM

## 2022-10-14 DIAGNOSIS — I872 Venous insufficiency (chronic) (peripheral): Secondary | ICD-10-CM | POA: Diagnosis not present

## 2022-10-14 DIAGNOSIS — I272 Pulmonary hypertension, unspecified: Secondary | ICD-10-CM | POA: Diagnosis not present

## 2022-10-14 DIAGNOSIS — R0602 Shortness of breath: Secondary | ICD-10-CM | POA: Diagnosis not present

## 2022-10-14 DIAGNOSIS — R739 Hyperglycemia, unspecified: Secondary | ICD-10-CM

## 2022-10-14 DIAGNOSIS — I4891 Unspecified atrial fibrillation: Secondary | ICD-10-CM

## 2022-10-14 NOTE — Progress Notes (Signed)
Subjective:    Patient ID: Michael Blevins, male    DOB: 05-29-1951, 72 y.o.   MRN: [id number]  Patient here for  Chief Complaint  Patient presents with   Chest Pain    HPI Work in appt - work in for sob and chest tightness.  Has noticed over the last two weeks, being more tired.  Feels like he can't get a good breath in.  Was concerned may be getting a respiratory infection.  Occasional cough.  No significant chest congestion.  No fever.  Some sob with exertion.  Has noticed some chest pain at night.  Now sleeping on two pillows.  Eating.  Has not increased sodium or salt intake.  No vomiting or abdominal pain reported.    Past Medical History:  Diagnosis Date   Allergy    cats and pollen   Arthritis    Asthma    Atrial fibrillation (HCC)    Back pain    Cellulitis    Gout    History of chicken pox    Hyperlipidemia    Hypertension    Kidney stone    Motion sickness    Sleep apnea    Venous insufficiency    Past Surgical History:  Procedure Laterality Date   APPENDECTOMY     CARDIAC CATHETERIZATION     CATARACT EXTRACTION W/PHACO Right 12/26/2021   Procedure: CATARACT EXTRACTION PHACO AND INTRAOCULAR LENS PLACEMENT (IOC) RIGHT;  Surgeon: Lockie Mola, MD;  Location: [facility];  Service: Ophthalmology;  Laterality: Right;  4.81 00:44.4   Family History  Problem Relation Age of Onset   Hypertension Mother    Diabetes Father    Kidney disease Neg Hx    Prostate cancer Neg Hx    Social History   Socioeconomic History   Marital status: Married    Spouse name: Not on file   Number of children: Not on file   Years of education: Not on file   Highest education level: Not on file  Occupational History   Occupation: retired  Tobacco Use   Smoking status: Former   Smokeless tobacco: Never   Tobacco comments:    quit 33 years  Vaping Use   Vaping Use: Never used  Substance and Sexual Activity   Alcohol use: No    Alcohol/week: 0.0 standard  drinks of alcohol   Drug use: No   Sexual activity: Not on file  Other Topics Concern   Not on file  Social History Narrative   Not on file   Social Determinants of Health   Financial Resource Strain: Not on file  Food Insecurity: Not on file  Transportation Needs: Not on file  Physical Activity: Not on file  Stress: Not on file  Social Connections: Not on file     Review of Systems  Constitutional:  Positive for fatigue. Negative for fever.  HENT:  Negative for congestion and sinus pressure.   Respiratory:  Positive for chest tightness and shortness of breath. Negative for cough.   Cardiovascular:  Negative for chest pain and palpitations.  Gastrointestinal:  Negative for abdominal pain, diarrhea, nausea and vomiting.  Genitourinary:  Negative for difficulty urinating and dysuria.  Musculoskeletal:  Negative for joint swelling and myalgias.  Skin:  Negative for color change and rash.  Neurological:  Negative for dizziness and headaches.  Psychiatric/Behavioral:  Negative for agitation and dysphoric mood.        Objective:     BP 128/72   Pulse  73   Temp 98.2 F (36.8 C)   Resp 16   Ht 6\' 1"  (1.854 m)   Wt (!) 302 lb (137 kg)   SpO2 98%   BMI 39.84 kg/m  Wt Readings from Last 3 Encounters:  10/14/22 (!) 302 lb (137 kg)  06/27/22 299 lb (135.6 kg)  04/25/22 (!) 304 lb (137.9 kg)    Physical Exam Vitals reviewed.  Constitutional:      General: He is not in acute distress.    Appearance: Normal appearance. He is well-developed.  HENT:     Head: Normocephalic and atraumatic.     Right Ear: External ear normal.     Left Ear: External ear normal.  Eyes:     General: No scleral icterus.       Right eye: No discharge.        Left eye: No discharge.     Conjunctiva/sclera: Conjunctivae normal.  Cardiovascular:     Rate and Rhythm: Normal rate.     Comments: Ventricular rate upper 50s-60s.  Pulmonary:     Effort: Pulmonary effort is normal. No respiratory  distress.     Breath sounds: Normal breath sounds.  Abdominal:     General: Bowel sounds are normal.     Palpations: Abdomen is soft.     Tenderness: There is no abdominal tenderness.  Musculoskeletal:        General: No swelling or tenderness.     Cervical back: Neck supple. No tenderness.  Lymphadenopathy:     Cervical: No cervical adenopathy.  Skin:    Findings: No erythema or rash.  Neurological:     Mental Status: He is alert.  Psychiatric:        Mood and Affect: Mood normal.        Behavior: Behavior normal.      Outpatient Encounter Medications as of 10/14/2022  Medication Sig   amLODipine (NORVASC) 10 MG tablet Take 1 tablet (10 mg total) by mouth daily.   brimonidine (ALPHAGAN) 0.2 % ophthalmic solution Place 1 drop into the right eye 2 (two) times daily.   carvedilol (COREG) 25 MG tablet Take 1 tablet (25 mg total) by mouth 2 (two) times daily.   docusate sodium (STOOL SOFTENER) 100 MG capsule Take 100 mg by mouth daily.   finasteride (PROSCAR) 5 MG tablet TAKE 1 TABLET(5 MG) BY MOUTH DAILY   furosemide (LASIX) 40 MG tablet TAKE 1 TABLET BY MOUTH DAILY AS NEEDED.   glucosamine-chondroitin 500-400 MG tablet Take 1 tablet by mouth 3 (three) times daily.   lisinopril (ZESTRIL) 40 MG tablet Take 1 tablet (40 mg total) by mouth daily.   lovastatin (MEVACOR) 20 MG tablet Take 1 tablet (20 mg total) by mouth at bedtime.   Multiple Vitamin (MULTIVITAMIN) tablet Take 1 tablet by mouth daily.   Omega-3 Fatty Acids (FISH OIL) 1000 MG CPDR Take by mouth.   rivaroxaban (XARELTO) 20 MG TABS tablet Take 1 tablet (20 mg total) by mouth daily with supper.   [DISCONTINUED] NEOMYCIN-POLYMYXIN-HYDROCORTISONE (CORTISPORIN) 1 % SOLN OTIC solution Apply 1-2 drops to toe BID after soaking   [DISCONTINUED] triamcinolone cream (KENALOG) 0.1 % Apply 1 Application topically 2 (two) times daily.   No facility-administered encounter medications on file as of 10/14/2022.     Lab Results  Component  Value Date   WBC 9.3 10/14/2022   HGB 15.8 10/14/2022   HCT 46.1 10/14/2022   PLT 185.0 10/14/2022   GLUCOSE 93 10/14/2022   CHOL 102  07/11/2022   TRIG 87.0 07/11/2022   HDL 32.00 (L) 07/11/2022   LDLDIRECT 77.0 04/06/2018   LDLCALC 52 07/11/2022   ALT 19 10/14/2022   AST 21 10/14/2022   NA 140 10/14/2022   K 4.1 10/14/2022   CL 104 10/14/2022   CREATININE 0.94 10/14/2022   BUN 13 10/14/2022   CO2 29 10/14/2022   TSH 3.46 03/14/2021   PSA 0.47 09/28/2021   INR 3.0 10/24/2021   HGBA1C 5.5 10/14/2022    No results found.     Assessment & Plan:  SOB (shortness of breath) Assessment & Plan: Reports noticing worsening sob and DOE with two pillow orthopnea over the last couple of weeks.  CXR today revealed mild cardiomegaly with central pulmonary vascular congestion. Possible minimal bibasilar subsegmental atelectasis or edema. Taking furosemide 40mg  q am.  Discussed cxr.  Discussed concern regarding volume overload.  EKG - afib, ventricular rate 58 with PVC and no acute ischemic changes.  Check routine labs and BNP.  Increase lasix to 40mg  bid.  Weight daily.  F/u with cardiology.  Question of need for f/u ECHO, etc.  Follow symptoms closely.  Call with update.   Orders: -     EKG 12-Lead -     CBC with Differential/Platelet -     Brain natriuretic peptide  Hyperglycemia Assessment & Plan: Low carb diet and exercise.  Follow met b and a1c.   Orders: -     Hemoglobin A1c  Hypercholesterolemia Assessment & Plan: On lovastatin.  Low cholesterol diet and exercise.  Follow lipid panel and liver function tests.   Orders: -     Hepatic function panel -     CBC with Differential/Platelet  Essential hypertension Assessment & Plan: On carvedilol, amlodipine and lisinopril. Blood pressure as outlined.  Spot check pressure.  Follow met b.  Orders: -     Basic metabolic panel  Venous insufficiency Assessment & Plan: Continue compression hose.    Pulmonary hypertension  (HCC) Assessment & Plan: Has been evaluated by cardiology.  Using cpap.  SOB as outlined.  F/u with cardiology - question of need for repeat ECHO.    Obstructive sleep apnea syndrome Assessment & Plan: Uses cpap regularly.    Atrial fibrillation, unspecified type [facility]) Assessment & Plan: Followed by cardiology. On xarelto.  Continue carvedilol.  Rate controlled. Blood pressure as outlined.  Adjust lasix as directed.  Follow.    Acquired thrombophilia (HCC) Assessment & Plan: Afib. On xarelto.       Dale Catawba, MD

## 2022-10-15 LAB — CBC WITH DIFFERENTIAL/PLATELET
Basophils Absolute: 0.1 10*3/uL (ref 0.0–0.1)
Basophils Relative: 0.7 % (ref 0.0–3.0)
Eosinophils Absolute: 0.2 10*3/uL (ref 0.0–0.7)
Eosinophils Relative: 2.5 % (ref 0.0–5.0)
HCT: 46.1 % (ref 39.0–52.0)
Hemoglobin: 15.8 g/dL (ref 13.0–17.0)
Lymphocytes Relative: 20.7 % (ref 12.0–46.0)
Lymphs Abs: 1.9 10*3/uL (ref 0.7–4.0)
MCHC: 34.2 g/dL (ref 30.0–36.0)
MCV: 89.4 fl (ref 78.0–100.0)
Monocytes Absolute: 0.9 10*3/uL (ref 0.1–1.0)
Monocytes Relative: 9.5 % (ref 3.0–12.0)
Neutro Abs: 6.2 10*3/uL (ref 1.4–7.7)
Neutrophils Relative %: 66.6 % (ref 43.0–77.0)
Platelets: 185 10*3/uL (ref 150.0–400.0)
RBC: 5.16 Mil/uL (ref 4.22–5.81)
RDW: 13.6 % (ref 11.5–15.5)
WBC: 9.3 10*3/uL (ref 4.0–10.5)

## 2022-10-15 LAB — BASIC METABOLIC PANEL
BUN: 13 mg/dL (ref 6–23)
CO2: 29 mEq/L (ref 19–32)
Calcium: 9.4 mg/dL (ref 8.4–10.5)
Chloride: 104 mEq/L (ref 96–112)
Creatinine, Ser: 0.94 mg/dL (ref 0.40–1.50)
GFR: 81.01 mL/min (ref >60.00)
Glucose, Bld: 93 mg/dL (ref 70–99)
Potassium: 4.1 mEq/L (ref 3.5–5.1)
Sodium: 140 mEq/L (ref 135–145)

## 2022-10-15 LAB — HEPATIC FUNCTION PANEL
ALT: 19 U/L (ref 0–53)
AST: 21 U/L (ref 0–37)
Albumin: 4.2 g/dL (ref 3.5–5.2)
Alkaline Phosphatase: 63 U/L (ref 39–117)
Bilirubin, Direct: 0.3 mg/dL (ref 0.0–0.3)
Total Bilirubin: 1 mg/dL (ref 0.2–1.2)
Total Protein: 6.8 g/dL (ref 6.0–8.3)

## 2022-10-15 LAB — BRAIN NATRIURETIC PEPTIDE: Brain Natriuretic Peptide: 204 pg/mL — ABNORMAL HIGH (ref <100)

## 2022-10-15 LAB — HEMOGLOBIN A1C: Hgb A1c MFr Bld: 5.5 % (ref 4.6–6.5)

## 2022-10-16 ENCOUNTER — Telehealth: Payer: Self-pay

## 2022-10-16 NOTE — Telephone Encounter (Signed)
LMTCB

## 2022-10-16 NOTE — Telephone Encounter (Signed)
-----   Message from Dale Granite, MD sent at 10/16/2022  5:35 AM EDT ----- Notify - overall sugar control is wnl.  White blood cell count, hgb, kidney function tests and liver function tests are wnl.  His BNP is elevated.  I have already increased his lasix to bid for 3 days.  Please call and see how he is feeling.  Have symptoms improved?

## 2022-10-17 ENCOUNTER — Encounter: Payer: Self-pay | Admitting: Internal Medicine

## 2022-10-20 ENCOUNTER — Encounter: Payer: Self-pay | Admitting: Internal Medicine

## 2022-10-20 NOTE — Assessment & Plan Note (Signed)
On lovastatin.  Low cholesterol diet and exercise.  Follow lipid panel and liver function tests.   

## 2022-10-20 NOTE — Progress Notes (Unsigned)
Cardiology Office Note  Date:  10/21/2022   ID:  Michael Blevins, DOB 01-22-1951, MRN [id number]  PCP:  Dale , MD   Chief Complaint  Patient presents with   Shortness of Breath    Medications reviewed by the patient verbally.     HPI:  Michael Blevins is a 72 yo  gentleman with history of  Remote history of smoking for 17 years Obesity, atrial fibrillation, permanent cardioversion in 2011, did not hold, on chronic anticoagulation ,  sleep apnea on CPAP,   Essential hypertension,  Hyperlipidemia,  Small atrial septal defect ( QP/QS 1.08 ),  Venous insufficiency,  prior cardiac catheterization in 2011 by report showing no significant coronary disease,  who presents for follow-up of his permanent atrial fibrillation, ASD, hyperlipidemia  Last seen by myself in clinic October 2023  Reports having recent shortness of breath symptoms Talked with Dr. Lorin Picket, chest x-ray and BNP performed showing vascular congestion on chest x-ray and BNP up to 200  He does report eating poorly, overeating, high salt diet, high fluid intake Dr. Lorin Picket recommended he take additional Lasix in the afternoon for 3 days then back to 40 daily He reports with extra Lasix he dropped 5 pounds, symptoms have improved, feels his breathing is back to baseline Reports high water intake, coffee intake Tries to stay hydrated given prior history of gout  On xarelto, no bleeding  Prior history of hematuiria Was given ABX Stopped shortly after  Labs reviewed Total chol 115, LDL 59  Chronic leg swelling, wears compression hose Previously declined changing amlodipine  EKG personally reviewed by myself on todays visit, no change Shows Atrial fibrillation with ventricular rate 66 bpm   Other past medical history reviewed Cellulitis in 10/2017 after sitting in a car driving to [facility] [place]  Previously seen at [facility],  stress echocardiogram 06/14/2016 performed for shortness of breath, exercised 6 minutes  on a Bruce protocol without chest pain or ECG changes. Echo showed no wall motion abnormality concerning for ischemia  echocardiogram revealed normal left ventricular function, with LV ejection fraction greater than 55%. My review of the study showed details of moderate TR, mild to moderately elevated right heart pressures, no mention of his left atrial or right atrial size, no mention of atrial septal defect  chronic mild lower extremity edema, nonpitting , per the notes felt to be secondary to venous insufficiency   negative cardiac cath 2011   Sleep apnea   PMH :   has a past medical history of Allergy, Arthritis, Asthma, Atrial fibrillation (HCC), Back pain, Cellulitis, Gout, History of chicken pox, Hyperlipidemia, Hypertension, Kidney stone, Motion sickness, Sleep apnea, and Venous insufficiency.  PSH:    Past Surgical History:  Procedure Laterality Date   APPENDECTOMY     CARDIAC CATHETERIZATION     CATARACT EXTRACTION W/PHACO Right 12/26/2021   Procedure: CATARACT EXTRACTION PHACO AND INTRAOCULAR LENS PLACEMENT (IOC) RIGHT;  Surgeon: Lockie Mola, MD;  Location: [facility];  Service: Ophthalmology;  Laterality: Right;  4.81 00:44.4    Current Outpatient Medications  Medication Sig Dispense Refill   amLODipine (NORVASC) 10 MG tablet Take 1 tablet (10 mg total) by mouth daily. 90 tablet 3   brimonidine (ALPHAGAN) 0.2 % ophthalmic solution Place 1 drop into the right eye 2 (two) times daily.     carvedilol (COREG) 25 MG tablet Take 1 tablet (25 mg total) by mouth 2 (two) times daily. 180 tablet 3   docusate sodium (STOOL SOFTENER) 100 MG capsule  Take 100 mg by mouth daily.     finasteride (PROSCAR) 5 MG tablet TAKE 1 TABLET(5 MG) BY MOUTH DAILY 90 tablet 1   furosemide (LASIX) 40 MG tablet Take 40 mg by mouth daily.     glucosamine-chondroitin 500-400 MG tablet Take 1 tablet by mouth 3 (three) times daily.     lisinopril (ZESTRIL) 40 MG tablet Take 1 tablet (40 mg  total) by mouth daily. 90 tablet 3   lovastatin (MEVACOR) 20 MG tablet Take 1 tablet (20 mg total) by mouth at bedtime. 90 tablet 3   Multiple Vitamin (MULTIVITAMIN) tablet Take 1 tablet by mouth daily.     Omega-3 Fatty Acids (FISH OIL) 1000 MG CPDR Take by mouth.     rivaroxaban (XARELTO) 20 MG TABS tablet Take 1 tablet (20 mg total) by mouth daily with supper. 30 tablet 11   No current facility-administered medications for this visit.    Allergies:   Patient has no known allergies.   Social History:  The patient  reports that he has quit smoking. He has never used smokeless tobacco. He reports that he does not drink alcohol and does not use drugs.   Family History:   family history includes Diabetes in his father; Hypertension in his mother.    Review of Systems: Review of Systems  Constitutional: Negative.   Respiratory: Negative.    Cardiovascular:  Positive for leg swelling.  Gastrointestinal: Negative.   Musculoskeletal: Negative.   Neurological: Negative.   Psychiatric/Behavioral: Negative.    All other systems reviewed and are negative.   PHYSICAL EXAM: VS:  BP 130/70 (BP Location: Left Arm, Patient Position: Sitting, Cuff Size: Normal)   Pulse 66   Ht 6\' 1"  (1.854 m)   Wt 296 lb 2 oz (134.3 kg)   SpO2 96%   BMI 39.07 kg/m  , BMI Body mass index is 39.07 kg/m. Constitutional:  oriented to person, place, and time. No distress.  HENT:  Head: Grossly normal Eyes:  no discharge. No scleral icterus.  Neck: No JVD, no carotid bruits  Cardiovascular: Regular rate and rhythm, no murmurs appreciated 1+ lower extremity edema below the knees Pulmonary/Chest: Clear to auscultation bilaterally, no wheezes or rales Abdominal: Soft.  no distension.  no tenderness.  Musculoskeletal: Normal range of motion Neurological:  normal muscle tone. Coordination normal. No atrophy Skin: Skin warm and dry Psychiatric: normal affect, pleasant  Recent Labs: 10/14/2022: ALT 19; Brain  Natriuretic Peptide 204; BUN 13; Creatinine, Ser 0.94; Hemoglobin 15.8; Platelets 185.0; Potassium 4.1; Sodium 140    Lipid Panel Lab Results  Component Value Date   CHOL 102 07/11/2022   HDL 32.00 (L) 07/11/2022   LDLCALC 52 07/11/2022   TRIG 87.0 07/11/2022      Wt Readings from Last 3 Encounters:  10/21/22 296 lb 2 oz (134.3 kg)  10/14/22 (!) 302 lb (137 kg)  06/27/22 299 lb (135.6 kg)     ASSESSMENT AND PLAN: Chronic atrial fibrillation (HCC) -  On xarelto CHADS VASC 4 carvedilol 25 twice daily, rate controlled Likely contributing to pulmonary edema exacerbation  Pulmonary edema Symptoms improved with Lasix 40 twice daily for 3 days then back down to 40 daily Recommend he moderate his fluid and salt intake, daily weights, extra Lasix for 3 pound weight gain Continue Xarelto 20 daily  Hematuria Tolerating Xarelto, no recurrence  Essential hypertension - Blood pressure is well controlled on today's visit. No changes made to the medications.  discussed changing amlodipine given leg swelling,  prefers no changes at this time  Hypercholesterolemia Cholesterol is at goal on the current lipid regimen. No changes to the medications were made.  Venous insufficiency Prior history of vein surgery May 2019 with cellulitis Exacerbated by weight Wears compression hose regularly Declining change of amlodipine  Encounter for anticoagulation discussion and counseling on Xarelto, no complications  Obstructive sleep apnea On CPAP, compliant  Pulmonary hypertension As above continue Lasix 40 daily with extra 40 in the afternoon as needed for weight gain, worsening leg swelling, abdominal distention, shortness of breath Discussed echo, he is declining at this time and will call us if he would like this ordered  Non-rheumatic tricuspid valve insufficiency moderate TR per the previous echo,  Previously declined echo, discussed again on today's visit  Morbid obesity (HCC) We  have encouraged continued exercise, careful diet management in an effort to lose weight.  Atrial septal defect  no  mention of atrial septal defect on limited echocardiogram done at outside office.  Previously declined echo   Total encounter time more than 40 minutes  Greater than 50% was spent in counseling and coordination of care with the patient     No orders of the defined types were placed in this encounter.    Signed, Dossie Arbour, M.D., Ph.D. 10/21/2022  [facility] [address], [place] [phone]

## 2022-10-20 NOTE — Assessment & Plan Note (Signed)
Uses cpap regularly.   

## 2022-10-20 NOTE — Assessment & Plan Note (Signed)
On carvedilol, amlodipine and lisinopril. Blood pressure as outlined.  Spot check pressure.  Follow met b. 

## 2022-10-20 NOTE — Assessment & Plan Note (Signed)
Has been evaluated by cardiology.  Using cpap.  SOB as outlined.  F/u with cardiology - question of need for repeat ECHO.

## 2022-10-20 NOTE — Assessment & Plan Note (Signed)
Continue compression hose.   

## 2022-10-20 NOTE — Assessment & Plan Note (Signed)
Low carb diet and exercise.  Follow met b and a1c.   

## 2022-10-20 NOTE — Assessment & Plan Note (Signed)
Followed by cardiology. On xarelto.  Continue carvedilol.  Rate controlled. Blood pressure as outlined.  Adjust lasix as directed.  Follow.

## 2022-10-20 NOTE — Assessment & Plan Note (Signed)
Reports noticing worsening sob and DOE with two pillow orthopnea over the last couple of weeks.  CXR today revealed mild cardiomegaly with central pulmonary vascular congestion. Possible minimal bibasilar subsegmental atelectasis or edema. Taking furosemide 40mg  q am.  Discussed cxr.  Discussed concern regarding volume overload.  EKG - afib, ventricular rate 58 with PVC and no acute ischemic changes.  Check routine labs and BNP.  Increase lasix to 40mg  bid.  Weight daily.  F/u with cardiology.  Question of need for f/u ECHO, etc.  Follow symptoms closely.  Call with update.

## 2022-10-20 NOTE — Assessment & Plan Note (Signed)
Afib. On xarelto.  

## 2022-10-21 ENCOUNTER — Ambulatory Visit: Payer: Medicare Other | Attending: Cardiovascular Disease | Admitting: Cardiovascular Disease

## 2022-10-21 ENCOUNTER — Encounter: Payer: Self-pay | Admitting: Cardiovascular Disease

## 2022-10-21 VITALS — BP 130/70 | HR 66 | Ht 73.0 in | Wt 296.1 lb

## 2022-10-21 DIAGNOSIS — I272 Pulmonary hypertension, unspecified: Secondary | ICD-10-CM | POA: Diagnosis not present

## 2022-10-21 DIAGNOSIS — J810 Acute pulmonary edema: Secondary | ICD-10-CM

## 2022-10-21 DIAGNOSIS — I10 Essential (primary) hypertension: Secondary | ICD-10-CM | POA: Insufficient documentation

## 2022-10-21 DIAGNOSIS — Q2111 Secundum atrial septal defect: Secondary | ICD-10-CM | POA: Diagnosis not present

## 2022-10-21 DIAGNOSIS — I482 Chronic atrial fibrillation, unspecified: Secondary | ICD-10-CM | POA: Diagnosis not present

## 2022-10-21 DIAGNOSIS — E782 Mixed hyperlipidemia: Secondary | ICD-10-CM | POA: Diagnosis not present

## 2022-10-21 DIAGNOSIS — I872 Venous insufficiency (chronic) (peripheral): Secondary | ICD-10-CM | POA: Diagnosis not present

## 2022-10-21 MED ORDER — RIVAROXABAN 20 MG PO TABS: 20.0000 mg | ORAL_TABLET | Freq: Every day | ORAL | 11 refills | Status: DC

## 2022-10-21 NOTE — Patient Instructions (Addendum)
Call if you would like an echocardiogram   Medication Instructions:  Lasix 40 daily with extra 40 mg in the pm as needed for shortness of breath  If you need a refill on your cardiac medications before your next appointment, please call your pharmacy.   Lab work: No new labs needed  Testing/Procedures: No new testing needed  Follow-Up: At [facility], you and your health needs are our priority.  As part of our continuing mission to provide you with exceptional heart care, we have created designated Provider Care Teams.  These Care Teams include your primary Cardiologist (physician) and Advanced Practice Providers (APPs -  Physician Assistants and Nurse Practitioners) who all work together to provide you with the care you need, when you need it.  You will need a follow up appointment in 12 months or sooner if needed for swelling or shortness of breath.  Providers on your designated Care Team:   Nicolasa Ducking, NP Eula Listen, PA-C Cadence Fransico [place], [place]  COVID-19 Vaccine Information can be found at: [email or web address] For questions related to vaccine distribution or appointments, please email [email or web address] or call [phone].

## 2022-10-30 ENCOUNTER — Ambulatory Visit (INDEPENDENT_AMBULATORY_CARE_PROVIDER_SITE_OTHER): Payer: Medicare Other | Admitting: Internal Medicine

## 2022-10-30 VITALS — BP 118/70 | HR 72 | Temp 97.9°F | Resp 16 | Ht 73.0 in | Wt 296.2 lb

## 2022-10-30 DIAGNOSIS — D582 Other hemoglobinopathies: Secondary | ICD-10-CM | POA: Diagnosis not present

## 2022-10-30 DIAGNOSIS — I272 Pulmonary hypertension, unspecified: Secondary | ICD-10-CM

## 2022-10-30 DIAGNOSIS — I4891 Unspecified atrial fibrillation: Secondary | ICD-10-CM | POA: Diagnosis not present

## 2022-10-30 DIAGNOSIS — E7800 Pure hypercholesterolemia, unspecified: Secondary | ICD-10-CM | POA: Diagnosis not present

## 2022-10-30 DIAGNOSIS — D6869 Other thrombophilia: Secondary | ICD-10-CM | POA: Diagnosis not present

## 2022-10-30 DIAGNOSIS — R319 Hematuria, unspecified: Secondary | ICD-10-CM | POA: Diagnosis not present

## 2022-10-30 DIAGNOSIS — Z6835 Body mass index (BMI) 35.0-35.9, adult: Secondary | ICD-10-CM

## 2022-10-30 DIAGNOSIS — I872 Venous insufficiency (chronic) (peripheral): Secondary | ICD-10-CM | POA: Diagnosis not present

## 2022-10-30 DIAGNOSIS — I10 Essential (primary) hypertension: Secondary | ICD-10-CM | POA: Diagnosis not present

## 2022-10-30 DIAGNOSIS — G4730 Sleep apnea, unspecified: Secondary | ICD-10-CM

## 2022-10-30 DIAGNOSIS — R739 Hyperglycemia, unspecified: Secondary | ICD-10-CM | POA: Diagnosis not present

## 2022-10-30 MED ORDER — FINASTERIDE 5 MG PO TABS: 5.0000 mg | ORAL_TABLET | Freq: Every day | ORAL | 1 refills | Status: DC

## 2022-10-30 NOTE — Progress Notes (Signed)
Subjective:    Patient ID: Michael Blevins, male    DOB: Jul 21, 1950, 72 y.o.   MRN: [id number]  Patient here for  Chief Complaint  Patient presents with   Medical Management of Chronic Issues    HPI Here to follow up regarding afib, hypercholesterolemia and hypertension.  Recently evaluated for worsening sob.  Noted to have central pulmonary vascular congestion on CXR.  Was given additional dose of lasix for three days, then resumed his q day dosing.  Had f/u with Dr Mariah Milling 10/21/22.  Per note, discussed f/u echo.  Wanted to hold.  Discussed today.  He is breathing better. No sob.  No chest pain.  No cough or congestion.  No acid reflux.  No abdominal pain or bowel change reported.  Legs better. Has decreased sodium intake.    Past Medical History:  Diagnosis Date   Allergy    cats and pollen   Arthritis    Asthma    Atrial fibrillation (HCC)    Back pain    Cellulitis    Gout    History of chicken pox    Hyperlipidemia    Hypertension    Kidney stone    Motion sickness    Sleep apnea    Venous insufficiency    Past Surgical History:  Procedure Laterality Date   APPENDECTOMY     CARDIAC CATHETERIZATION     CATARACT EXTRACTION W/PHACO Right 12/26/2021   Procedure: CATARACT EXTRACTION PHACO AND INTRAOCULAR LENS PLACEMENT (IOC) RIGHT;  Surgeon: Lockie Mola, MD;  Location: [facility];  Service: Ophthalmology;  Laterality: Right;  4.81 00:44.4   Family History  Problem Relation Age of Onset   Hypertension Mother    Diabetes Father    Kidney disease Neg Hx    Prostate cancer Neg Hx    Social History   Socioeconomic History   Marital status: Married    Spouse name: Not on file   Number of children: Not on file   Years of education: Not on file   Highest education level: Not on file  Occupational History   Occupation: retired  Tobacco Use   Smoking status: Former   Smokeless tobacco: Never   Tobacco comments:    quit 33 years  Vaping Use    Vaping Use: Never used  Substance and Sexual Activity   Alcohol use: No    Alcohol/week: 0.0 standard drinks of alcohol   Drug use: No   Sexual activity: Not on file  Other Topics Concern   Not on file  Social History Narrative   Not on file   Social Determinants of Health   Financial Resource Strain: Not on file  Food Insecurity: Not on file  Transportation Needs: Not on file  Physical Activity: Not on file  Stress: Not on file  Social Connections: Not on file     Review of Systems  Constitutional:  Negative for appetite change and unexpected weight change.  HENT:  Negative for congestion and sinus pressure.   Respiratory:  Negative for cough, chest tightness and shortness of breath.   Cardiovascular:  Negative for chest pain and palpitations.       Leg swelling - better.   Gastrointestinal:  Negative for abdominal pain, diarrhea, nausea and vomiting.  Genitourinary:  Negative for difficulty urinating and dysuria.  Musculoskeletal:  Negative for joint swelling and myalgias.  Skin:  Negative for color change and rash.  Neurological:  Negative for dizziness and headaches.  Psychiatric/Behavioral:  Negative  for agitation and dysphoric mood.        Objective:     BP 118/70   Pulse 72   Temp 97.9 F (36.6 C)   Resp 16   Ht 6\' 1"  (1.854 m)   Wt 296 lb 3.2 oz (134.4 kg)   SpO2 98%   BMI 39.08 kg/m  Wt Readings from Last 3 Encounters:  10/30/22 296 lb 3.2 oz (134.4 kg)  10/21/22 296 lb 2 oz (134.3 kg)  10/14/22 (!) 302 lb (137 kg)    Physical Exam Vitals reviewed.  Constitutional:      General: He is not in acute distress.    Appearance: Normal appearance. He is well-developed.  HENT:     Head: Normocephalic and atraumatic.     Right Ear: External ear normal.     Left Ear: External ear normal.  Eyes:     General: No scleral icterus.       Right eye: No discharge.        Left eye: No discharge.     Conjunctiva/sclera: Conjunctivae normal.  Cardiovascular:      Rate and Rhythm: Normal rate and regular rhythm.  Pulmonary:     Effort: Pulmonary effort is normal. No respiratory distress.     Breath sounds: Normal breath sounds.  Abdominal:     General: Bowel sounds are normal.     Palpations: Abdomen is soft.     Tenderness: There is no abdominal tenderness.  Musculoskeletal:        General: No tenderness.     Cervical back: Neck supple. No tenderness.     Comments: No increased swelling.  Improved.  No increased erythema.   Lymphadenopathy:     Cervical: No cervical adenopathy.  Skin:    Findings: No erythema or rash.  Neurological:     Mental Status: He is alert.  Psychiatric:        Mood and Affect: Mood normal.        Behavior: Behavior normal.      Outpatient Encounter Medications as of 10/30/2022  Medication Sig   amLODipine (NORVASC) 10 MG tablet Take 1 tablet (10 mg total) by mouth daily.   brimonidine (ALPHAGAN) 0.2 % ophthalmic solution Place 1 drop into the right eye 2 (two) times daily.   carvedilol (COREG) 25 MG tablet Take 1 tablet (25 mg total) by mouth 2 (two) times daily.   docusate sodium (STOOL SOFTENER) 100 MG capsule Take 100 mg by mouth daily.   finasteride (PROSCAR) 5 MG tablet Take 1 tablet (5 mg total) by mouth daily.   furosemide (LASIX) 40 MG tablet Take 40 mg by mouth daily. Take additional 40 mg in the PM as needed for shortness of breath.   glucosamine-chondroitin 500-400 MG tablet Take 1 tablet by mouth 3 (three) times daily.   lisinopril (ZESTRIL) 40 MG tablet Take 1 tablet (40 mg total) by mouth daily.   lovastatin (MEVACOR) 20 MG tablet Take 1 tablet (20 mg total) by mouth at bedtime.   Multiple Vitamin (MULTIVITAMIN) tablet Take 1 tablet by mouth daily.   Omega-3 Fatty Acids (FISH OIL) 1000 MG CPDR Take by mouth.   rivaroxaban (XARELTO) 20 MG TABS tablet Take 1 tablet (20 mg total) by mouth daily with supper.   [DISCONTINUED] finasteride (PROSCAR) 5 MG tablet TAKE 1 TABLET(5 MG) BY MOUTH DAILY    No facility-administered encounter medications on file as of 10/30/2022.     Lab Results  Component Value Date  WBC 9.3 10/14/2022   HGB 15.8 10/14/2022   HCT 46.1 10/14/2022   PLT 185.0 10/14/2022   GLUCOSE 93 10/14/2022   CHOL 102 07/11/2022   TRIG 87.0 07/11/2022   HDL 32.00 (L) 07/11/2022   LDLDIRECT 77.0 04/06/2018   LDLCALC 52 07/11/2022   ALT 19 10/14/2022   AST 21 10/14/2022   NA 140 10/14/2022   K 4.1 10/14/2022   CL 104 10/14/2022   CREATININE 0.94 10/14/2022   BUN 13 10/14/2022   CO2 29 10/14/2022   TSH 3.46 03/14/2021   PSA 0.47 09/28/2021   INR 3.0 10/24/2021   HGBA1C 5.5 10/14/2022    DG Chest 2 View  Result Date: 10/14/2022 CLINICAL DATA:  Chest tightness, difficulty breathing. EXAM: CHEST - 2 VIEW COMPARISON:  None Available. FINDINGS: Mild cardiomegaly is noted with central pulmonary vascular congestion. Possible minimal bibasilar subsegmental edema or atelectasis is noted. Bony thorax is unremarkable. IMPRESSION: Mild cardiomegaly with central pulmonary vascular congestion. Possible minimal bibasilar subsegmental atelectasis or edema. Electronically Signed   By: Lupita Raider M.D.   On: 10/14/2022 10:34       Assessment & Plan:  Hyperglycemia -     Hemoglobin A1c; Future  Hypercholesterolemia -     Lipid panel; Future -     Hepatic function panel; Future  Essential hypertension -     Basic metabolic panel; Future  Other orders -     Finasteride; Take 1 tablet (5 mg total) by mouth daily.  Dispense: 90 tablet; Refill: 1     Dale Ivey, MD

## 2022-11-04 ENCOUNTER — Encounter: Payer: Self-pay | Admitting: Internal Medicine

## 2022-11-04 NOTE — Assessment & Plan Note (Signed)
Afib. On xarelto.  

## 2022-11-04 NOTE — Assessment & Plan Note (Signed)
Followed by cardiology. On xarelto.  Continue carvedilol.  Rate controlled. Blood pressure as outlined.  Follow.

## 2022-11-04 NOTE — Assessment & Plan Note (Signed)
Low carb diet and exercise.  Follow met b and a1c.   

## 2022-11-04 NOTE — Assessment & Plan Note (Signed)
On lovastatin.  Low cholesterol diet and exercise.  Follow lipid panel and liver function tests.   

## 2022-11-04 NOTE — Assessment & Plan Note (Signed)
Uses cpap regularly.   

## 2022-11-04 NOTE — Assessment & Plan Note (Signed)
Continue compression hose.   

## 2022-11-04 NOTE — Assessment & Plan Note (Signed)
Has been worked up by urology previously.  On finasteride.  Recent hematuria. Recheck urine to confirm clear.

## 2022-11-04 NOTE — Assessment & Plan Note (Signed)
Being treated for OSA. Using cpap regularly.  Follow [facility].  ?

## 2022-11-04 NOTE — Assessment & Plan Note (Signed)
On carvedilol, amlodipine and lisinopril. Blood pressure as outlined.  Spot check pressure.  Follow met b. 

## 2022-11-04 NOTE — Assessment & Plan Note (Signed)
Has been evaluated by cardiology.  Using cpap.

## 2022-11-04 NOTE — Assessment & Plan Note (Signed)
Diet, exercise.  Follow.  ?

## 2022-11-08 ENCOUNTER — Telehealth: Payer: Self-pay | Admitting: Cardiovascular Disease

## 2022-11-08 DIAGNOSIS — R0602 Shortness of breath: Secondary | ICD-10-CM

## 2022-11-08 NOTE — Telephone Encounter (Signed)
Left voicemail message to call back  

## 2022-11-08 NOTE — Telephone Encounter (Signed)
Patient is calling to schedule echo and needs an order for this. Requesting call back to schedule.

## 2022-11-11 ENCOUNTER — Encounter: Payer: Self-pay | Admitting: Internal Medicine

## 2022-11-11 NOTE — Telephone Encounter (Signed)
Pt scheduled with Dr Scott 

## 2022-11-12 ENCOUNTER — Ambulatory Visit (INDEPENDENT_AMBULATORY_CARE_PROVIDER_SITE_OTHER): Payer: Medicare Other | Admitting: Internal Medicine

## 2022-11-12 ENCOUNTER — Encounter: Payer: Self-pay | Admitting: Internal Medicine

## 2022-11-12 VITALS — BP 126/78 | HR 70 | Temp 97.8°F | Resp 16 | Ht 73.0 in | Wt 298.2 lb

## 2022-11-12 DIAGNOSIS — I10 Essential (primary) hypertension: Secondary | ICD-10-CM | POA: Diagnosis not present

## 2022-11-12 DIAGNOSIS — I4891 Unspecified atrial fibrillation: Secondary | ICD-10-CM

## 2022-11-12 DIAGNOSIS — D6869 Other thrombophilia: Secondary | ICD-10-CM | POA: Diagnosis not present

## 2022-11-12 DIAGNOSIS — M109 Gout, unspecified: Secondary | ICD-10-CM

## 2022-11-12 MED ORDER — PREDNISONE 20 MG PO TABS
ORAL_TABLET | ORAL | 0 refills | Status: DC
Start: 2022-11-12 — End: 2023-06-12

## 2022-11-12 MED ORDER — ALLOPURINOL 100 MG PO TABS: 100.0000 mg | ORAL_TABLET | Freq: Every day | ORAL | 2 refills | Status: DC

## 2022-11-12 NOTE — Patient Instructions (Signed)
Take allopurinol after acute flare resolves.

## 2022-11-12 NOTE — Telephone Encounter (Signed)
Spoke with patient about the echocardiogram that he would like to schedule. Transferred over to scheduling and he was very appreciative for the call back. No further needs.     Per Dr. Mariah Milling last note from 10/21/22 was as follows: Discussed echo, he is declining at this time and will call us if he would like this ordered

## 2022-11-12 NOTE — Progress Notes (Signed)
Subjective:    Patient ID: Michael Blevins, male    DOB: 1950-06-22, 72 y.o.   MRN: [id number]  Patient here for  Chief Complaint  Patient presents with   Gout    HPI Work in appt for possible gout.  Reports that he started having pain - base of right toe - yesterday.  Has a history of gout.  Feels similar to previous gout flare.  No fever.  No injury reported.  No redness extending up foot or leg.  Breathing better.  Scheduled for echo in July.  Discussed preventative medication.     Past Medical History:  Diagnosis Date   Allergy    cats and pollen   Arthritis    Asthma    Atrial fibrillation (HCC)    Back pain    Cellulitis    Gout    History of chicken pox    Hyperlipidemia    Hypertension    Kidney stone    Motion sickness    Sleep apnea    Venous insufficiency    Past Surgical History:  Procedure Laterality Date   APPENDECTOMY     CARDIAC CATHETERIZATION     CATARACT EXTRACTION W/PHACO Right 12/26/2021   Procedure: CATARACT EXTRACTION PHACO AND INTRAOCULAR LENS PLACEMENT (IOC) RIGHT;  Surgeon: Lockie Mola, MD;  Location: [facility];  Service: Ophthalmology;  Laterality: Right;  4.81 00:44.4   Family History  Problem Relation Age of Onset   Hypertension Mother    Diabetes Father    Kidney disease Neg Hx    Prostate cancer Neg Hx    Social History   Socioeconomic History   Marital status: Married    Spouse name: Not on file   Number of children: Not on file   Years of education: Not on file   Highest education level: Not on file  Occupational History   Occupation: retired  Tobacco Use   Smoking status: Former   Smokeless tobacco: Never   Tobacco comments:    quit 33 years  Vaping Use   Vaping Use: Never used  Substance and Sexual Activity   Alcohol use: No    Alcohol/week: 0.0 standard drinks of alcohol   Drug use: No   Sexual activity: Not on file  Other Topics Concern   Not on file  Social History Narrative   Not on  file   Social Determinants of Health   Financial Resource Strain: Not on file  Food Insecurity: Not on file  Transportation Needs: Not on file  Physical Activity: Not on file  Stress: Not on file  Social Connections: Not on file     Review of Systems  Constitutional:  Negative for appetite change and unexpected weight change.  HENT:  Negative for congestion and sinus pressure.   Respiratory:  Negative for cough and chest tightness.        Breathing better.   Cardiovascular:  Negative for chest pain and palpitations.       No increased swelling.   Gastrointestinal:  Negative for abdominal pain, nausea and vomiting.  Genitourinary:  Negative for difficulty urinating and dysuria.  Musculoskeletal:        Pain - right foot/base of toe as outlined.   Skin:  Negative for color change and rash.  Neurological:  Negative for dizziness and headaches.  Psychiatric/Behavioral:  Negative for agitation and dysphoric mood.        Objective:     BP 126/78   Pulse 70  Temp 97.8 F (36.6 C)   Resp 16   Ht 6\' 1"  (1.854 m)   Wt 298 lb 3.2 oz (135.3 kg)   SpO2 98%   BMI 39.34 kg/m  Wt Readings from Last 3 Encounters:  11/12/22 298 lb 3.2 oz (135.3 kg)  10/30/22 296 lb 3.2 oz (134.4 kg)  10/21/22 296 lb 2 oz (134.3 kg)    Physical Exam Vitals reviewed.  Constitutional:      General: He is not in acute distress.    Appearance: Normal appearance. He is well-developed.  HENT:     Head: Normocephalic and atraumatic.     Right Ear: External ear normal.     Left Ear: External ear normal.  Eyes:     General: No scleral icterus.       Right eye: No discharge.        Left eye: No discharge.     Conjunctiva/sclera: Conjunctivae normal.  Cardiovascular:     Rate and Rhythm: Normal rate and regular rhythm.  Pulmonary:     Effort: Pulmonary effort is normal. No respiratory distress.     Breath sounds: Normal breath sounds.  Abdominal:     General: Bowel sounds are normal.      Palpations: Abdomen is soft.     Tenderness: There is no abdominal tenderness.  Musculoskeletal:        General: No swelling.     Cervical back: Neck supple. No tenderness.     Comments: Increased tenderness - base of right great toe.  No erythema extending up foot or leg.   Lymphadenopathy:     Cervical: No cervical adenopathy.  Skin:    Findings: No erythema or rash.  Neurological:     Mental Status: He is alert.  Psychiatric:        Mood and Affect: Mood normal.        Behavior: Behavior normal.      Outpatient Encounter Medications as of 11/12/2022  Medication Sig   allopurinol (ZYLOPRIM) 100 MG tablet Take 1 tablet (100 mg total) by mouth daily.   predniSONE (DELTASONE) 20 MG tablet Take one tablet bid for 5 days, then one po q day for 3 days and then 1/2 q day for two days and then stop.   amLODipine (NORVASC) 10 MG tablet Take 1 tablet (10 mg total) by mouth daily.   brimonidine (ALPHAGAN) 0.2 % ophthalmic solution Place 1 drop into the right eye 2 (two) times daily.   carvedilol (COREG) 25 MG tablet Take 1 tablet (25 mg total) by mouth 2 (two) times daily.   docusate sodium (STOOL SOFTENER) 100 MG capsule Take 100 mg by mouth daily.   finasteride (PROSCAR) 5 MG tablet Take 1 tablet (5 mg total) by mouth daily.   furosemide (LASIX) 40 MG tablet Take 40 mg by mouth daily. Take additional 40 mg in the PM as needed for shortness of breath.   glucosamine-chondroitin 500-400 MG tablet Take 1 tablet by mouth 3 (three) times daily.   lisinopril (ZESTRIL) 40 MG tablet Take 1 tablet (40 mg total) by mouth daily.   lovastatin (MEVACOR) 20 MG tablet Take 1 tablet (20 mg total) by mouth at bedtime.   Multiple Vitamin (MULTIVITAMIN) tablet Take 1 tablet by mouth daily.   Omega-3 Fatty Acids (FISH OIL) 1000 MG CPDR Take by mouth.   rivaroxaban (XARELTO) 20 MG TABS tablet Take 1 tablet (20 mg total) by mouth daily with supper.   No facility-administered encounter medications on  file as of  11/12/2022.     Lab Results  Component Value Date   WBC 9.3 10/14/2022   HGB 15.8 10/14/2022   HCT 46.1 10/14/2022   PLT 185.0 10/14/2022   GLUCOSE 93 10/14/2022   CHOL 102 07/11/2022   TRIG 87.0 07/11/2022   HDL 32.00 (L) 07/11/2022   LDLDIRECT 77.0 04/06/2018   LDLCALC 52 07/11/2022   ALT 19 10/14/2022   AST 21 10/14/2022   NA 140 10/14/2022   K 4.1 10/14/2022   CL 104 10/14/2022   CREATININE 0.94 10/14/2022   BUN 13 10/14/2022   CO2 29 10/14/2022   TSH 3.46 03/14/2021   PSA 0.47 09/28/2021   INR 3.0 10/24/2021   HGBA1C 5.5 10/14/2022    DG Chest 2 View  Result Date: 10/14/2022 CLINICAL DATA:  Chest tightness, difficulty breathing. EXAM: CHEST - 2 VIEW COMPARISON:  None Available. FINDINGS: Mild cardiomegaly is noted with central pulmonary vascular congestion. Possible minimal bibasilar subsegmental edema or atelectasis is noted. Bony thorax is unremarkable. IMPRESSION: Mild cardiomegaly with central pulmonary vascular congestion. Possible minimal bibasilar subsegmental atelectasis or edema. Electronically Signed   By: Lupita Raider M.D.   On: 10/14/2022 10:34       Assessment & Plan:  Gout involving toe of right foot, unspecified cause, unspecified chronicity Assessment & Plan: Symptoms and exam appear to be c/w gout flare.  Unable to take antiinflammatory medication.  Treat with prednisone as directed.  Has taken prednisone and tolerated.  Discussed diet.  Discussed starting allopurinol once acute flare resolved.  Follow.    Essential hypertension Assessment & Plan: On carvedilol, amlodipine and lisinopril. Blood pressure as outlined.  Follow met b.   Acquired thrombophilia (HCC) Assessment & Plan: Afib. On xarelto.    Atrial fibrillation, unspecified type [facility]) Assessment & Plan: Followed by cardiology. On xarelto.  Continue carvedilol.  Rate controlled. Blood pressure as outlined.  Follow.    Other orders -     predniSONE; Take one tablet bid for 5 days,  then one po q day for 3 days and then 1/2 q day for two days and then stop.  Dispense: 15 tablet; Refill: 0 -     Allopurinol; Take 1 tablet (100 mg total) by mouth daily.  Dispense: 30 tablet; Refill: 2     Dale [place], MD

## 2022-11-17 ENCOUNTER — Encounter: Payer: Self-pay | Admitting: Internal Medicine

## 2022-11-17 DIAGNOSIS — M109 Gout, unspecified: Secondary | ICD-10-CM | POA: Insufficient documentation

## 2022-11-17 NOTE — Assessment & Plan Note (Signed)
Followed by cardiology. On xarelto.  Continue carvedilol.  Rate controlled. Blood pressure as outlined.  Follow.  

## 2022-11-17 NOTE — Assessment & Plan Note (Signed)
Afib. On xarelto.  

## 2022-11-17 NOTE — Assessment & Plan Note (Signed)
Symptoms and exam appear to be c/w gout flare.  Unable to take antiinflammatory medication.  Treat with prednisone as directed.  Has taken prednisone and tolerated.  Discussed diet.  Discussed starting allopurinol once acute flare resolved.  Follow.

## 2022-11-17 NOTE — Assessment & Plan Note (Addendum)
On carvedilol, amlodipine and lisinopril. Blood pressure as outlined.  Follow met b.

## 2022-12-31 ENCOUNTER — Other Ambulatory Visit: Payer: Self-pay

## 2022-12-31 ENCOUNTER — Telehealth: Payer: Self-pay | Admitting: Internal Medicine

## 2022-12-31 MED ORDER — FINASTERIDE 5 MG PO TABS: 5.0000 mg | ORAL_TABLET | Freq: Every day | ORAL | 1 refills | Status: DC

## 2022-12-31 NOTE — Telephone Encounter (Signed)
Prescription Request  12/31/2022  LOV: 11/12/2022  What is the name of the medication or equipment? finasteride (PROSCAR) 5 MG tablet    Have you contacted your pharmacy to request a refill? No   Which pharmacy would you like this sent to?  [id number] [place], [place] - [address] AT [facility] OF [address] [place] [address] Phone: [phone] Fax: [phone]    Patient notified that their request is being sent to the clinical staff for review and that they should receive a response within 2 business days.   Please advise at [facility] [phone]

## 2022-12-31 NOTE — Telephone Encounter (Signed)
Medication refilled. Patient is aware °

## 2023-01-07 ENCOUNTER — Ambulatory Visit: Payer: Medicare Other | Attending: Cardiovascular Disease

## 2023-01-07 DIAGNOSIS — R0602 Shortness of breath: Secondary | ICD-10-CM | POA: Insufficient documentation

## 2023-01-07 LAB — ECHOCARDIOGRAM COMPLETE
AR max vel: 2.44 cm2
AV Area VTI: 2.23 cm2
AV Area mean vel: 2.26 cm2
AV Mean grad: 3.5 mmHg
AV Peak grad: 6.4 mmHg
Ao pk vel: 1.27 m/s
S' Lateral: 4.3 cm

## 2023-01-07 MED ORDER — PERFLUTREN LIPID MICROSPHERE
1.0000 mL | INTRAVENOUS | Status: AC | PRN
Start: 2023-01-07 — End: 2023-01-07
  Administered 2023-01-07: 2 mL via INTRAVENOUS

## 2023-01-08 ENCOUNTER — Encounter (INDEPENDENT_AMBULATORY_CARE_PROVIDER_SITE_OTHER): Payer: Self-pay

## 2023-01-28 ENCOUNTER — Other Ambulatory Visit (INDEPENDENT_AMBULATORY_CARE_PROVIDER_SITE_OTHER): Payer: Medicare Other

## 2023-01-28 DIAGNOSIS — R739 Hyperglycemia, unspecified: Secondary | ICD-10-CM

## 2023-01-28 DIAGNOSIS — I10 Essential (primary) hypertension: Secondary | ICD-10-CM | POA: Diagnosis not present

## 2023-01-28 DIAGNOSIS — E7800 Pure hypercholesterolemia, unspecified: Secondary | ICD-10-CM | POA: Diagnosis not present

## 2023-01-28 LAB — LIPID PANEL
Cholesterol: 99 mg/dL (ref 0–200)
HDL: 28.3 mg/dL — ABNORMAL LOW (ref >39.00)
LDL Cholesterol: 51 mg/dL (ref 0–99)
NonHDL: 70.94
Total CHOL/HDL Ratio: 4
Triglycerides: 98 mg/dL (ref 0.0–149.0)
VLDL: 19.6 mg/dL (ref 0.0–40.0)

## 2023-01-28 LAB — BASIC METABOLIC PANEL
BUN: 14 mg/dL (ref 6–23)
CO2: 27 mEq/L (ref 19–32)
Calcium: 8.8 mg/dL (ref 8.4–10.5)
Chloride: 107 mEq/L (ref 96–112)
Creatinine, Ser: 0.86 mg/dL (ref 0.40–1.50)
GFR: 86.35 mL/min (ref >60.00)
Glucose, Bld: 106 mg/dL — ABNORMAL HIGH (ref 70–99)
Potassium: 3.5 mEq/L (ref 3.5–5.1)
Sodium: 141 mEq/L (ref 135–145)

## 2023-01-28 LAB — HEPATIC FUNCTION PANEL
ALT: 22 U/L (ref 0–53)
AST: 22 U/L (ref 0–37)
Albumin: 3.8 g/dL (ref 3.5–5.2)
Alkaline Phosphatase: 61 U/L (ref 39–117)
Bilirubin, Direct: 0.3 mg/dL (ref 0.0–0.3)
Total Bilirubin: 1.2 mg/dL (ref 0.2–1.2)
Total Protein: 6.3 g/dL (ref 6.0–8.3)

## 2023-01-28 LAB — HEMOGLOBIN A1C: Hgb A1c MFr Bld: 5.6 % (ref 4.6–6.5)

## 2023-01-30 ENCOUNTER — Ambulatory Visit: Payer: Medicare Other | Admitting: Internal Medicine

## 2023-01-31 DIAGNOSIS — H40002 Preglaucoma, unspecified, left eye: Secondary | ICD-10-CM | POA: Diagnosis not present

## 2023-01-31 DIAGNOSIS — Z961 Presence of intraocular lens: Secondary | ICD-10-CM | POA: Diagnosis not present

## 2023-01-31 DIAGNOSIS — H0100A Unspecified blepharitis right eye, upper and lower eyelids: Secondary | ICD-10-CM | POA: Diagnosis not present

## 2023-01-31 DIAGNOSIS — H401112 Primary open-angle glaucoma, right eye, moderate stage: Secondary | ICD-10-CM | POA: Diagnosis not present

## 2023-02-04 ENCOUNTER — Encounter: Payer: Self-pay | Admitting: Internal Medicine

## 2023-02-04 ENCOUNTER — Ambulatory Visit (INDEPENDENT_AMBULATORY_CARE_PROVIDER_SITE_OTHER): Payer: Medicare Other | Admitting: Internal Medicine

## 2023-02-04 ENCOUNTER — Ambulatory Visit (INDEPENDENT_AMBULATORY_CARE_PROVIDER_SITE_OTHER): Payer: Medicare Other

## 2023-02-04 VITALS — BP 122/78 | HR 75 | Temp 97.5°F | Ht 73.0 in | Wt 299.4 lb

## 2023-02-04 DIAGNOSIS — M109 Gout, unspecified: Secondary | ICD-10-CM | POA: Diagnosis not present

## 2023-02-04 DIAGNOSIS — Z8616 Personal history of COVID-19: Secondary | ICD-10-CM | POA: Diagnosis not present

## 2023-02-04 DIAGNOSIS — I517 Cardiomegaly: Secondary | ICD-10-CM | POA: Diagnosis not present

## 2023-02-04 DIAGNOSIS — I872 Venous insufficiency (chronic) (peripheral): Secondary | ICD-10-CM | POA: Diagnosis not present

## 2023-02-04 DIAGNOSIS — I10 Essential (primary) hypertension: Secondary | ICD-10-CM | POA: Diagnosis not present

## 2023-02-04 DIAGNOSIS — D6869 Other thrombophilia: Secondary | ICD-10-CM | POA: Diagnosis not present

## 2023-02-04 DIAGNOSIS — R053 Chronic cough: Secondary | ICD-10-CM

## 2023-02-04 DIAGNOSIS — G4730 Sleep apnea, unspecified: Secondary | ICD-10-CM | POA: Diagnosis not present

## 2023-02-04 DIAGNOSIS — E7800 Pure hypercholesterolemia, unspecified: Secondary | ICD-10-CM

## 2023-02-04 DIAGNOSIS — I4891 Unspecified atrial fibrillation: Secondary | ICD-10-CM | POA: Diagnosis not present

## 2023-02-04 DIAGNOSIS — D582 Other hemoglobinopathies: Secondary | ICD-10-CM | POA: Diagnosis not present

## 2023-02-04 DIAGNOSIS — R319 Hematuria, unspecified: Secondary | ICD-10-CM

## 2023-02-04 DIAGNOSIS — Z1211 Encounter for screening for malignant neoplasm of colon: Secondary | ICD-10-CM

## 2023-02-04 DIAGNOSIS — R739 Hyperglycemia, unspecified: Secondary | ICD-10-CM | POA: Diagnosis not present

## 2023-02-04 DIAGNOSIS — I272 Pulmonary hypertension, unspecified: Secondary | ICD-10-CM

## 2023-02-04 MED ORDER — ALLOPURINOL 100 MG PO TABS: 100.0000 mg | ORAL_TABLET | Freq: Every day | ORAL | 2 refills | Status: DC

## 2023-02-04 NOTE — Progress Notes (Signed)
Subjective:    Patient ID: Michael Blevins, male    DOB: 08/04/1950, 73 y.o.   MRN: [id number]  Patient here for  Chief Complaint  Patient presents with   Medical Management of Chronic Issues    HPI Here to follow up regarding afib, hypercholesterolemia and hypertension. On xarelto.  Sees cardiology.  No chest pain reported.  Had covid - dx 17 days ago.  Still with mild headache intermittently, nasal congestion and some chest congestion and cough.  Increased fatigue.  Mild sob - since started - overall symptoms improved.  No increased sob currently. Has been using flonase and robitussin.  No abdominal pain or bowel change.     Past Medical History:  Diagnosis Date   Allergy    cats and pollen   Arthritis    Asthma    Atrial fibrillation (HCC)    Back pain    Cellulitis    Gout    History of chicken pox    Hyperlipidemia    Hypertension    Kidney stone    Motion sickness    Sleep apnea    Venous insufficiency    Past Surgical History:  Procedure Laterality Date   APPENDECTOMY     CARDIAC CATHETERIZATION     CATARACT EXTRACTION W/PHACO Right 12/26/2021   Procedure: CATARACT EXTRACTION PHACO AND INTRAOCULAR LENS PLACEMENT (IOC) RIGHT;  Surgeon: Lockie Mola, MD;  Location: [facility];  Service: Ophthalmology;  Laterality: Right;  4.81 00:44.4   Family History  Problem Relation Age of Onset   Hypertension Mother    Diabetes Father    Kidney disease Neg Hx    Prostate cancer Neg Hx    Social History   Socioeconomic History   Marital status: Married    Spouse name: Not on file   Number of children: Not on file   Years of education: Not on file   Highest education level: Not on file  Occupational History   Occupation: retired  Tobacco Use   Smoking status: Former   Smokeless tobacco: Never   Tobacco comments:    quit 33 years  Vaping Use   Vaping status: Never Used  Substance and Sexual Activity   Alcohol use: No    Alcohol/week: 0.0  standard drinks of alcohol   Drug use: No   Sexual activity: Not on file  Other Topics Concern   Not on file  Social History Narrative   Not on file   Social Determinants of Health   Financial Resource Strain: Not on file  Food Insecurity: Not on file  Transportation Needs: Not on file  Physical Activity: Not on file  Stress: Not on file  Social Connections: Not on file     Review of Systems  Constitutional:  Positive for fatigue. Negative for appetite change and unexpected weight change.  HENT:  Positive for congestion and postnasal drip. Negative for sore throat.        No sore throat now.   Respiratory:  Positive for cough. Negative for chest tightness.        Mild sob as outlined.   Cardiovascular:  Negative for chest pain and palpitations.       No increased swelling.   Gastrointestinal:  Negative for abdominal pain, diarrhea, nausea and vomiting.  Genitourinary:  Negative for difficulty urinating and dysuria.  Musculoskeletal:  Negative for joint swelling and myalgias.  Skin:  Negative for color change and rash.  Neurological:  Negative for dizziness and headaches.  Psychiatric/Behavioral:  Negative for agitation and dysphoric mood.        Objective:     BP 122/78   Pulse 75   Temp (!) 97.5 F (36.4 C)   Ht 6\' 1"  (1.854 m)   Wt 299 lb 6.4 oz (135.8 kg)   SpO2 99%   BMI 39.50 kg/m  Wt Readings from Last 3 Encounters:  02/04/23 299 lb 6.4 oz (135.8 kg)  11/12/22 298 lb 3.2 oz (135.3 kg)  10/30/22 296 lb 3.2 oz (134.4 kg)    Physical Exam Constitutional:      General: He is not in acute distress.    Appearance: Normal appearance. He is well-developed.  HENT:     Head: Normocephalic and atraumatic.     Right Ear: External ear normal.     Left Ear: External ear normal.  Eyes:     General: No scleral icterus.       Right eye: No discharge.        Left eye: No discharge.  Cardiovascular:     Rate and Rhythm: Normal rate and regular rhythm.   Pulmonary:     Effort: Pulmonary effort is normal. No respiratory distress.     Breath sounds: Normal breath sounds.  Abdominal:     General: Bowel sounds are normal.     Palpations: Abdomen is soft.     Tenderness: There is no abdominal tenderness.  Musculoskeletal:        General: No tenderness.     Cervical back: Neck supple. No tenderness.     Comments: No increased swelling.   Lymphadenopathy:     Cervical: No cervical adenopathy.  Skin:    Findings: No erythema or rash.  Neurological:     Mental Status: He is alert.  Psychiatric:        Mood and Affect: Mood normal.        Behavior: Behavior normal.      Outpatient Encounter Medications as of 02/04/2023  Medication Sig   allopurinol (ZYLOPRIM) 100 MG tablet Take 1 tablet (100 mg total) by mouth daily.   amLODipine (NORVASC) 10 MG tablet Take 1 tablet (10 mg total) by mouth daily.   brimonidine (ALPHAGAN) 0.2 % ophthalmic solution Place 1 drop into the right eye 2 (two) times daily.   carvedilol (COREG) 25 MG tablet Take 1 tablet (25 mg total) by mouth 2 (two) times daily.   docusate sodium (STOOL SOFTENER) 100 MG capsule Take 100 mg by mouth daily.   finasteride (PROSCAR) 5 MG tablet Take 1 tablet (5 mg total) by mouth daily.   furosemide (LASIX) 40 MG tablet Take 40 mg by mouth daily. Take additional 40 mg in the PM as needed for shortness of breath.   glucosamine-chondroitin 500-400 MG tablet Take 1 tablet by mouth 3 (three) times daily.   lisinopril (ZESTRIL) 40 MG tablet Take 1 tablet (40 mg total) by mouth daily.   lovastatin (MEVACOR) 20 MG tablet Take 1 tablet (20 mg total) by mouth at bedtime.   Multiple Vitamin (MULTIVITAMIN) tablet Take 1 tablet by mouth daily.   Omega-3 Fatty Acids (FISH OIL) 1000 MG CPDR Take by mouth.   predniSONE (DELTASONE) 20 MG tablet Take one tablet bid for 5 days, then one po q day for 3 days and then 1/2 q day for two days and then stop.   rivaroxaban (XARELTO) 20 MG TABS tablet Take  1 tablet (20 mg total) by mouth daily with supper.   [DISCONTINUED] allopurinol (  ZYLOPRIM) 100 MG tablet Take 1 tablet (100 mg total) by mouth daily.   No facility-administered encounter medications on file as of 02/04/2023.     Lab Results  Component Value Date   WBC 9.3 10/14/2022   HGB 15.8 10/14/2022   HCT 46.1 10/14/2022   PLT 185.0 10/14/2022   GLUCOSE 106 (H) 01/28/2023   CHOL 99 01/28/2023   TRIG 98.0 01/28/2023   HDL 28.30 (L) 01/28/2023   LDLDIRECT 77.0 04/06/2018   LDLCALC 51 01/28/2023   ALT 22 01/28/2023   AST 22 01/28/2023   NA 141 01/28/2023   K 3.5 01/28/2023   CL 107 01/28/2023   CREATININE 0.86 01/28/2023   BUN 14 01/28/2023   CO2 27 01/28/2023   TSH 3.46 03/14/2021   PSA 0.47 09/28/2021   INR 3.0 10/24/2021   HGBA1C 5.6 01/28/2023    DG Chest 2 View  Result Date: 10/14/2022 CLINICAL DATA:  Chest tightness, difficulty breathing. EXAM: CHEST - 2 VIEW COMPARISON:  None Available. FINDINGS: Mild cardiomegaly is noted with central pulmonary vascular congestion. Possible minimal bibasilar subsegmental edema or atelectasis is noted. Bony thorax is unremarkable. IMPRESSION: Mild cardiomegaly with central pulmonary vascular congestion. Possible minimal bibasilar subsegmental atelectasis or edema. Electronically Signed   By: Lupita Raider M.D.   On: 10/14/2022 10:34       Assessment & Plan:  Colon cancer screening -     Cologuard  Persistent cough Assessment & Plan: Recent covid as outlined.  Check cxr.  Robitussin DM, flonase.  Follow.   Orders: -     DG Chest 2 View; Future  History of COVID-19 Assessment & Plan: Recent covid as outlined.  Persistent symptoms.  Persistent drainage and cough.  Continue flonase as directed.  Robitussin DM. Will check cxr.  Follow.   Orders: -     DG Chest 2 View; Future  Essential hypertension Assessment & Plan: On carvedilol, amlodipine and lisinopril. Blood pressure as outlined.  Follow met b.  Orders: -      Basic metabolic panel; Future -     TSH; Future  Hypercholesterolemia Assessment & Plan: On lovastatin.  Low cholesterol diet and exercise.  Follow lipid panel and liver function tests.   Orders: -     Lipid panel; Future -     Hepatic function panel; Future  Hyperglycemia Assessment & Plan: Low carb diet and exercise.  Follow met b and a1c.   Orders: -     Hemoglobin A1c; Future  Acquired thrombophilia (HCC) Assessment & Plan: Afib. On xarelto.    Sleep apnea, unspecified type Assessment & Plan: Uses cpap regularly.     Atrial fibrillation, unspecified type [facility]) Assessment & Plan: Followed by cardiology. On xarelto.  Continue carvedilol.  Rate controlled. Blood pressure as outlined.  Follow.    Pulmonary hypertension (HCC) Assessment & Plan: Has been evaluated by cardiology.  Using cpap.     Elevated hemoglobin (HCC) Assessment & Plan: Being treated for OSA. Using cpap regularly.  Follow cbc.    Gout involving toe of right foot, unspecified cause, unspecified chronicity Assessment & Plan: Continue allopurinol.  Follow.    Hematuria, unspecified type Assessment & Plan: Has been worked up by urology previously.  On finasteride.  Recent hematuria. Recheck urine to confirm clear.   Orders: -     Urinalysis, Routine w reflex microscopic; Future  Venous insufficiency Assessment & Plan: Continue compression hose.    Other orders -     Allopurinol; Take 1  tablet (100 mg total) by mouth daily.  Dispense: 90 tablet; Refill: 2     Dale [place], MD

## 2023-02-09 ENCOUNTER — Encounter: Payer: Self-pay | Admitting: Internal Medicine

## 2023-02-09 ENCOUNTER — Telehealth: Payer: Self-pay | Admitting: Internal Medicine

## 2023-02-09 NOTE — Assessment & Plan Note (Signed)
Has been worked up by urology previously.  On finasteride.  Recent hematuria. Recheck urine to confirm clear.

## 2023-02-09 NOTE — Assessment & Plan Note (Signed)
Has been evaluated by cardiology.  Using cpap.

## 2023-02-09 NOTE — Telephone Encounter (Signed)
Notify Mr Conary - after reviewing his chart, I would like for him to come in for urine check to confirm - no blood in urine.  Order placed.  Just need urine checked.

## 2023-02-09 NOTE — Assessment & Plan Note (Signed)
Continue compression hose.   

## 2023-02-09 NOTE — Assessment & Plan Note (Signed)
Uses cpap regularly.   

## 2023-02-09 NOTE — Assessment & Plan Note (Signed)
Being treated for OSA. Using cpap regularly.  Follow cbc.  

## 2023-02-09 NOTE — Assessment & Plan Note (Signed)
Followed by cardiology. On xarelto.  Continue carvedilol.  Rate controlled. Blood pressure as outlined.  Follow.

## 2023-02-09 NOTE — Assessment & Plan Note (Signed)
Afib. On xarelto.

## 2023-02-09 NOTE — Assessment & Plan Note (Signed)
On lovastatin.  Low cholesterol diet and exercise.  Follow lipid panel and liver function tests.   

## 2023-02-09 NOTE — Assessment & Plan Note (Signed)
On carvedilol, amlodipine and lisinopril. Blood pressure as outlined.  Follow met b.

## 2023-02-09 NOTE — Assessment & Plan Note (Signed)
Recent covid as outlined.  Check cxr.  Robitussin DM, flonase.  Follow.

## 2023-02-09 NOTE — Assessment & Plan Note (Signed)
Continue allopurinol.  Follow.

## 2023-02-09 NOTE — Assessment & Plan Note (Signed)
Low carb diet and exercise.  Follow met b and a1c.   

## 2023-02-09 NOTE — Assessment & Plan Note (Signed)
Recent covid as outlined.  Persistent symptoms.  Persistent drainage and cough.  Continue flonase as directed.  Robitussin DM. Will check cxr.  Follow.

## 2023-02-12 NOTE — Telephone Encounter (Signed)
PT SCHEDULED

## 2023-02-13 ENCOUNTER — Other Ambulatory Visit (INDEPENDENT_AMBULATORY_CARE_PROVIDER_SITE_OTHER): Payer: Medicare Other

## 2023-02-13 DIAGNOSIS — I10 Essential (primary) hypertension: Secondary | ICD-10-CM

## 2023-02-13 DIAGNOSIS — E7800 Pure hypercholesterolemia, unspecified: Secondary | ICD-10-CM

## 2023-02-13 DIAGNOSIS — R739 Hyperglycemia, unspecified: Secondary | ICD-10-CM

## 2023-02-13 DIAGNOSIS — R319 Hematuria, unspecified: Secondary | ICD-10-CM | POA: Diagnosis not present

## 2023-02-14 LAB — URINALYSIS, ROUTINE W REFLEX MICROSCOPIC
Bilirubin Urine: NEGATIVE
Hgb urine dipstick: NEGATIVE
Ketones, ur: NEGATIVE
Leukocytes,Ua: NEGATIVE
Nitrite: NEGATIVE
Specific Gravity, Urine: 1.01 (ref 1.000–1.030)
Total Protein, Urine: NEGATIVE
Urine Glucose: NEGATIVE
Urobilinogen, UA: 0.2 (ref 0.0–1.0)
WBC, UA: NONE SEEN (ref 0–?)
pH: 6.5 (ref 5.0–8.0)

## 2023-02-24 ENCOUNTER — Other Ambulatory Visit: Payer: Self-pay | Admitting: Cardiovascular Disease

## 2023-03-03 ENCOUNTER — Telehealth: Payer: Self-pay | Admitting: Cardiovascular Disease

## 2023-03-03 ENCOUNTER — Other Ambulatory Visit: Payer: Self-pay | Admitting: Cardiovascular Disease

## 2023-03-03 DIAGNOSIS — D2272 Melanocytic nevi of left lower limb, including hip: Secondary | ICD-10-CM | POA: Diagnosis not present

## 2023-03-03 DIAGNOSIS — D485 Neoplasm of uncertain behavior of skin: Secondary | ICD-10-CM | POA: Diagnosis not present

## 2023-03-03 DIAGNOSIS — L821 Other seborrheic keratosis: Secondary | ICD-10-CM | POA: Diagnosis not present

## 2023-03-03 DIAGNOSIS — D2262 Melanocytic nevi of left upper limb, including shoulder: Secondary | ICD-10-CM | POA: Diagnosis not present

## 2023-03-03 DIAGNOSIS — D225 Melanocytic nevi of trunk: Secondary | ICD-10-CM | POA: Diagnosis not present

## 2023-03-03 DIAGNOSIS — D2261 Melanocytic nevi of right upper limb, including shoulder: Secondary | ICD-10-CM | POA: Diagnosis not present

## 2023-03-03 DIAGNOSIS — D2271 Melanocytic nevi of right lower limb, including hip: Secondary | ICD-10-CM | POA: Diagnosis not present

## 2023-03-03 MED ORDER — FUROSEMIDE 40 MG PO TABS: 40.0000 mg | ORAL_TABLET | Freq: Every day | ORAL | 1 refills | Status: DC | PRN

## 2023-03-03 NOTE — Addendum Note (Signed)
Addended by: Guerry Minors on: 03/03/2023 02:50 PM   Modules accepted: Orders

## 2023-03-03 NOTE — Telephone Encounter (Signed)
Requested Prescriptions   Signed Prescriptions Disp Refills   furosemide (LASIX) 40 MG tablet 90 tablet 1    Sig: Take 1 tablet (40 mg total) by mouth daily as needed.    Authorizing Provider: Antonieta Iba    Ordering User: Guerry Minors

## 2023-03-03 NOTE — Telephone Encounter (Signed)
*  STAT* If patient is at the pharmacy, call can be transferred to refill team.   1. Which medications need to be refilled? (please list name of each medication and dose if known) furosemide (LASIX) 40 MG tablet   2. Which pharmacy/location (including street and city if local pharmacy) is medication to be sent to?  [id number] - [place], [place] - [address] AT [facility] OF [address]    3. Do they need a 30 day or 90 day supply? 90

## 2023-03-03 NOTE — Telephone Encounter (Addendum)
Requested Prescriptions   Signed Prescriptions Disp Refills   furosemide (LASIX) 40 MG tablet 90 tablet 1    Sig: Take 1 tablet (40 mg total) by mouth daily as needed.    Authorizing Provider: Antonieta Iba    Ordering User: Guerry Minors

## 2023-03-04 ENCOUNTER — Other Ambulatory Visit: Payer: Self-pay | Admitting: Cardiovascular Disease

## 2023-03-06 DIAGNOSIS — Z1211 Encounter for screening for malignant neoplasm of colon: Secondary | ICD-10-CM | POA: Diagnosis not present

## 2023-03-12 LAB — COLOGUARD: COLOGUARD: POSITIVE — AB

## 2023-03-13 ENCOUNTER — Encounter: Payer: Self-pay | Admitting: Internal Medicine

## 2023-03-13 DIAGNOSIS — R195 Other fecal abnormalities: Secondary | ICD-10-CM

## 2023-03-13 NOTE — Telephone Encounter (Signed)
Discussed with patient. Referral placed for [facility] per patient request.

## 2023-03-13 NOTE — Telephone Encounter (Signed)
Patient just returned phone call. He said could [place] call him back. He said they were talking 10 minutes ago.

## 2023-03-26 ENCOUNTER — Other Ambulatory Visit: Payer: Self-pay | Admitting: Internal Medicine

## 2023-03-26 NOTE — Telephone Encounter (Signed)
This has previously been filled by Dr Mariah Milling. Per chart review, still seeing cardiology. Are you ok with filling his lisinopril?

## 2023-04-01 NOTE — Telephone Encounter (Signed)
Please follow up on referral to [facility] for positive cologuard.

## 2023-04-08 ENCOUNTER — Other Ambulatory Visit: Payer: Self-pay | Admitting: Cardiovascular Disease

## 2023-04-17 DIAGNOSIS — D2371 Other benign neoplasm of skin of right lower limb, including hip: Secondary | ICD-10-CM | POA: Diagnosis not present

## 2023-04-17 DIAGNOSIS — D2271 Melanocytic nevi of right lower limb, including hip: Secondary | ICD-10-CM | POA: Diagnosis not present

## 2023-04-24 DIAGNOSIS — Z7901 Long term (current) use of anticoagulants: Secondary | ICD-10-CM | POA: Diagnosis not present

## 2023-04-24 DIAGNOSIS — R195 Other fecal abnormalities: Secondary | ICD-10-CM | POA: Diagnosis not present

## 2023-04-25 ENCOUNTER — Telehealth: Payer: Self-pay | Admitting: Cardiovascular Disease

## 2023-04-25 NOTE — Telephone Encounter (Signed)
   Patient Name: Michael Blevins  DOB: July 04, 1950 MRN: [id number]  Primary Cardiologist: None  Clinical pharmacists have reviewed the patient's past medical history, labs, and current medications as part of preoperative protocol coverage. The following recommendations have been made:   Patient with diagnosis of afib on Xarelto for anticoagulation.     Procedure: colonoscopy Date of procedure: TBD     CHA2DS2-VASc Score = 4   This indicates a 4.8% annual risk of stroke. The patient's score is based upon: CHF History: 0 HTN History: 1 Diabetes History: 0 Stroke History: 2 Vascular Disease History: 0 Age Score: 1 Gender Score: 0       CrCl 146 ml/min Platelet count 185   Per office protocol, patient can hold Xarelto for 2 days prior to procedure. Please resume Xarelto as soon as possible postprocedure, at the discretion of the surgeon.    I will route this recommendation to the requesting party via Epic fax function and remove from pre-op pool.  Please call with questions.  Joylene Grapes, NP 04/25/2023, 3:36 PM

## 2023-04-25 NOTE — Telephone Encounter (Signed)
   Pre-operative Risk Assessment    Patient Name: Michael Blevins  DOB: 1950/11/21 MRN: [id number]      Request for Surgical Clearance    Procedure:   colonoscopy  Date of Surgery:  Clearance TBD                                 Surgeon:  not indicated Surgeon's Group or Practice Name:  [facility] clinic gastroenterology Phone number:  [phone] Fax number:  [phone]   Type of Clearance Requested:   - Pharmacy:  Hold Rivaroxaban (Xarelto) follow instructions   Type of Anesthesia:  Not Indicated   Additional requests/questions:    Burnett Sheng   04/25/2023, 1:12 PM

## 2023-04-25 NOTE — Telephone Encounter (Signed)
Patient with diagnosis of afib on Xarelto for anticoagulation.    Procedure: colonoscopy Date of procedure: TBD   CHA2DS2-VASc Score = 4   This indicates a 4.8% annual risk of stroke. The patient's score is based upon: CHF History: 0 HTN History: 1 Diabetes History: 0 Stroke History: 2 Vascular Disease History: 0 Age Score: 1 Gender Score: 0      CrCl 146 ml/min Platelet count 185  Per office protocol, patient can hold Xarelto for 2 days prior to procedure.     **This guidance is not considered finalized until pre-operative APP has relayed final recommendations.**

## 2023-05-04 ENCOUNTER — Encounter: Payer: Self-pay | Admitting: Internal Medicine

## 2023-05-05 NOTE — Telephone Encounter (Signed)
Pt wife called about mychart message

## 2023-05-05 NOTE — Telephone Encounter (Signed)
Pt scheduled with Claris Che tomorrow. Currently taking Doxy.

## 2023-05-06 ENCOUNTER — Encounter: Payer: Self-pay | Admitting: Family

## 2023-05-06 ENCOUNTER — Ambulatory Visit: Payer: Medicare Other | Admitting: Family

## 2023-05-06 VITALS — BP 132/70 | HR 71 | Temp 97.6°F | Ht 73.0 in | Wt 295.2 lb

## 2023-05-06 DIAGNOSIS — L03115 Cellulitis of right lower limb: Secondary | ICD-10-CM

## 2023-05-06 MED ORDER — DOXYCYCLINE HYCLATE 100 MG PO TABS
100.0000 mg | ORAL_TABLET | Freq: Two times a day (BID) | ORAL | 0 refills | Status: AC
Start: 2023-05-06 — End: 2023-05-16

## 2023-05-06 NOTE — Progress Notes (Signed)
Assessment & Plan:  Cellulitis of right lower extremity Assessment & Plan: Acute on chronic.  Cellulitis improving after dermatology incision.  Advised patient to complete course of doxycycline.  I refilled doxycycline for him to keep on hand.  He  declines right leg ultrasound to evaluate for DVT as swelling is consistent with previous episodes of cellulitis.  He will continue probiotics  Orders: -     Doxycycline Hyclate; Take 1 tablet (100 mg total) by mouth 2 (two) times daily for 10 days.  Dispense: 20 tablet; Refill: 0     Return precautions given.   Risks, benefits, and alternatives of the medications and treatment plan prescribed today were discussed, and patient expressed understanding.   Education regarding symptom management and diagnosis given to patient on AVS either electronically or printed.  No follow-ups on file.  Rennie Plowman, FNP  Subjective:    Patient ID: Michael Blevins, male    DOB: 1951/04/08, 72 y.o.   MRN: [id number]  CC: Michael Blevins is a 72 y.o. male who presents today for an acute visit.    HPI: Accompanied by wife  Concern for right leg cellulitis.  Endorses increased warmth, erythema and right leg swelling.  Swelling improves with elevation and in the morning swelling is completely resolved.  He is using compression stockings.   he started Doxycycline and redness and heat are dissipating.   He had right leg lesion excised by Dr Roseanne Kaufman 2 weeks ago.  Currently waiting on pathology results.  He sutures removed 14 days after excision , 6 days ago, he woke up 2 days later with increased redness.     Compliant with Xarelto 20 mg d/t h.o atrial fib  No h/o DVT  No recent immobilization  Right leg ultrasound 10/12/2017 negative DVT  H/o recurrent cellulitis and hospitalization.  Prescribed prescribed doxycycline for him to keep on hand which he started over the weekend  Echocardiogram 01/07/2023 normal left and right ventricular size  and function.  Mildly of elevated right heart pressure.  Dr. Mariah Milling prescribed Lasix Allergies: Patient has no known allergies. Current Outpatient Medications on File Prior to Visit  Medication Sig Dispense Refill   allopurinol (ZYLOPRIM) 100 MG tablet Take 1 tablet (100 mg total) by mouth daily. 90 tablet 2   amLODipine (NORVASC) 10 MG tablet Take 1 tablet (10 mg total) by mouth daily. 90 tablet 3   brimonidine (ALPHAGAN) 0.2 % ophthalmic solution Place 1 drop into the right eye 2 (two) times daily.     carvedilol (COREG) 25 MG tablet TAKE 1 TABLET(25 MG) BY MOUTH TWICE DAILY 180 tablet 3   docusate sodium (STOOL SOFTENER) 100 MG capsule Take 100 mg by mouth daily.     finasteride (PROSCAR) 5 MG tablet Take 1 tablet (5 mg total) by mouth daily. 90 tablet 1   furosemide (LASIX) 40 MG tablet Take 1 tablet (40 mg total) by mouth daily as needed. 90 tablet 1   glucosamine-chondroitin 500-400 MG tablet Take 1 tablet by mouth 3 (three) times daily.     lisinopril (ZESTRIL) 40 MG tablet TAKE 1 TABLET(40 MG) BY MOUTH DAILY 90 tablet 3   lovastatin (MEVACOR) 20 MG tablet TAKE 1 TABLET(20 MG) BY MOUTH AT BEDTIME 90 tablet 1   Multiple Vitamin (MULTIVITAMIN) tablet Take 1 tablet by mouth daily.     Omega-3 Fatty Acids (FISH OIL) 1000 MG CPDR Take by mouth.     predniSONE (DELTASONE) 20 MG tablet Take one tablet bid for  5 days, then one po q day for 3 days and then 1/2 q day for two days and then stop. 15 tablet 0   rivaroxaban (XARELTO) 20 MG TABS tablet Take 1 tablet (20 mg total) by mouth daily with supper. 30 tablet 11   No current facility-administered medications on file prior to visit.    Review of Systems  Constitutional:  Negative for chills and fever.  Respiratory:  Negative for cough and shortness of breath.   Cardiovascular:  Positive for leg swelling. Negative for chest pain and palpitations.  Gastrointestinal:  Negative for nausea and vomiting.  Skin:  Positive for rash.       Objective:    BP 132/70   Pulse 71   Temp 97.6 F (36.4 C) (Oral)   Ht 6\' 1"  (1.854 m)   Wt 295 lb 3.2 oz (133.9 kg)   SpO2 96%   BMI 38.95 kg/m   BP Readings from Last 3 Encounters:  05/06/23 132/70  02/04/23 122/78  11/12/22 126/78   Wt Readings from Last 3 Encounters:  05/06/23 295 lb 3.2 oz (133.9 kg)  02/04/23 299 lb 6.4 oz (135.8 kg)  11/12/22 298 lb 3.2 oz (135.3 kg)    Physical Exam Vitals reviewed.  Constitutional:      Appearance: He is well-developed.  Cardiovascular:     Rate and Rhythm: Regular rhythm.     Heart sounds: Normal heart sounds.     Comments: RLE edema, nonpitting.  No palpable cords or masses.  LE warm and palpable pedal pulses.  Pulmonary:     Effort: Pulmonary effort is normal. No respiratory distress.     Breath sounds: Normal breath sounds. No wheezing, rhonchi or rales.  Skin:    General: Skin is warm and dry.          Comments: Right lower extremity erythema, increased warmth. Healed incision site without purulent discharge.   Neurological:     Mental Status: He is alert.  Psychiatric:        Speech: Speech normal.        Behavior: Behavior normal.

## 2023-05-06 NOTE — Patient Instructions (Signed)
Please complete doxycycline.  Please continue probiotics and let me know if redness is not completely resolved.    Nice to meet you.  Cellulitis, Adult  Cellulitis is a skin infection. The infected area is usually warm, red, swollen, and tender. It most commonly occurs on the lower body, such as the legs, feet, and toes, but this condition can occur on any part of the body. The infection can travel to the muscles, blood, and underlying tissue and become life-threatening without treatment. It is important to get medical treatment right away for this condition. What are the causes? Cellulitis is caused by bacteria. The bacteria enter through a break in the skin, such as a cut, burn, insect or animal bite, open sore, or crack. What increases the risk? This condition is more likely to occur in people who: Have a weak body's defense system (immune system). Are older than 73 years old. Have diabetes. Have a type of long-term (chronic) liver disease (cirrhosis) or kidney disease. Are obese. Have a skin condition such as: An itchy rash, such as eczema or psoriasis. A fungal rash on the feet or in skinfolds. Blistering rashes, such as shingles or chickenpox. Slow movement of blood in the veins (venous stasis). Fluid buildup below the skin (edema). Have open wounds on the skin, such as cuts, puncture wounds, burns, bites, scrapes, tattoos, piercings, or wounds from surgery. Have had radiation therapy. Use IV drugs. What are the signs or symptoms? Symptoms of this condition include: Skin that looks red, purple, or slightly darker than your usual skin color. Streaks or spots on the skin. Swollen area of the skin. Tenderness or pain when an area of the skin is touched. Warm skin. Fever or chills. Blisters. Tiredness (fatigue). How is this diagnosed? This condition is diagnosed based on a medical history and physical exam. You may also have tests, including: Blood tests. Imaging  tests. Tests on a sample of fluid taken from the wound (wound culture). How is this treated? Treatment for this condition may include: Medicines. These may include antibiotics or medicines to treat allergies (antihistamines). Rest. Applying cold or warm wet cloths (compresses) to the skin. If the condition is severe, you may need to stay in the hospital and get antibiotics through an IV. The infection usually starts to get better within 1-2 days of treatment. Follow these instructions at home: Medicines Take over-the-counter and prescription medicines only as told by your health care provider. If you were prescribed antibiotics, take them as told by your provider. Do not stop using the antibiotic even if you start to feel better. General instructions Drink enough fluid to keep your pee (urine) pale yellow. Do not touch or rub the infected area. Raise (elevate) the infected area above the level of your heart while you are sitting or lying down. Return to your normal activities as told by your provider. Ask your provider what activities are safe for you. Apply warm or cold compresses to the affected area as told by your provider. Keep all follow-up visits. Your provider will need to make sure that a more serious infection is not developing. Contact a health care provider if: You have a fever. Your symptoms do not improve within 1-2 days of starting treatment or you develop new symptoms. Your bone or joint underneath the infected area becomes painful after the skin has healed. Your infection returns in the same area or another area. Signs of this may include: You notice a swollen bump in the infected area. Your  red area gets larger, turns dark in color, or becomes more painful. Drainage increases. Pus or a bad smell develops in your infected area. You have more pain. You feel ill and have muscle aches and weakness. You develop vomiting or diarrhea that will not go away. Get help right  away if: You notice red streaks coming from the infected area. You notice the skin turns purple or black and falls off. This symptom may be an emergency. Get help right away. Call 911. Do not wait to see if the symptom will go away. Do not drive yourself to the hospital. This information is not intended to replace advice given to you by your health care provider. Make sure you discuss any questions you have with your health care provider. Document Revised: 01/22/2022 Document Reviewed: 01/22/2022 Elsevier Patient Education  2024 [employer].

## 2023-05-06 NOTE — Assessment & Plan Note (Signed)
Acute on chronic.  Cellulitis improving after dermatology incision.  Advised patient to complete course of doxycycline.  I refilled doxycycline for him to keep on hand.  He  declines right leg ultrasound to evaluate for DVT as swelling is consistent with previous episodes of cellulitis.  He will continue probiotics

## 2023-05-07 ENCOUNTER — Other Ambulatory Visit: Payer: Self-pay | Admitting: Cardiovascular Disease

## 2023-05-07 NOTE — Telephone Encounter (Signed)
Last visit 10/21/22 with plan to f/u in 12 months.    Next visit: none/active recall

## 2023-06-06 ENCOUNTER — Other Ambulatory Visit (INDEPENDENT_AMBULATORY_CARE_PROVIDER_SITE_OTHER): Payer: Medicare Other

## 2023-06-06 DIAGNOSIS — R739 Hyperglycemia, unspecified: Secondary | ICD-10-CM

## 2023-06-06 DIAGNOSIS — R319 Hematuria, unspecified: Secondary | ICD-10-CM

## 2023-06-06 DIAGNOSIS — E7800 Pure hypercholesterolemia, unspecified: Secondary | ICD-10-CM | POA: Diagnosis not present

## 2023-06-06 DIAGNOSIS — I10 Essential (primary) hypertension: Secondary | ICD-10-CM

## 2023-06-06 LAB — HEPATIC FUNCTION PANEL
ALT: 18 U/L (ref 0–53)
AST: 19 U/L (ref 0–37)
Albumin: 4 g/dL (ref 3.5–5.2)
Alkaline Phosphatase: 70 U/L (ref 39–117)
Bilirubin, Direct: 0.3 mg/dL (ref 0.0–0.3)
Total Bilirubin: 1 mg/dL (ref 0.2–1.2)
Total Protein: 6.2 g/dL (ref 6.0–8.3)

## 2023-06-06 LAB — BASIC METABOLIC PANEL
BUN: 16 mg/dL (ref 6–23)
CO2: 30 meq/L (ref 19–32)
Calcium: 8.8 mg/dL (ref 8.4–10.5)
Chloride: 105 meq/L (ref 96–112)
Creatinine, Ser: 0.99 mg/dL (ref 0.40–1.50)
GFR: 75.78 mL/min (ref >60.00)
Glucose, Bld: 94 mg/dL (ref 70–99)
Potassium: 4 meq/L (ref 3.5–5.1)
Sodium: 142 meq/L (ref 135–145)

## 2023-06-06 LAB — HEMOGLOBIN A1C: Hgb A1c MFr Bld: 5.7 % (ref 4.6–6.5)

## 2023-06-06 LAB — LIPID PANEL
Cholesterol: 102 mg/dL (ref 0–200)
HDL: 34.8 mg/dL — ABNORMAL LOW (ref >39.00)
LDL Cholesterol: 49 mg/dL (ref 0–99)
NonHDL: 66.93
Total CHOL/HDL Ratio: 3
Triglycerides: 89 mg/dL (ref 0.0–149.0)
VLDL: 17.8 mg/dL (ref 0.0–40.0)

## 2023-06-06 LAB — TSH: TSH: 2.55 u[IU]/mL (ref 0.35–5.50)

## 2023-06-11 NOTE — Progress Notes (Signed)
 Subjective:    Patient ID: Michael Blevins, male    DOB: December 12, 1950, 73 y.o.   MRN: [id number]  Patient here for  Chief Complaint  Patient presents with   Medical Management of Chronic Issues    HPI Here for a scheduled follow up - follow up regarding afib, hypercholesterolemia and hypertension. On xarelto .  Sees cardiology. On carvedilol , amlodipine  and lisinopril . Using cpap. Recent cologuard positive - referred to GI. Colonoscopy planned 07/10/23. Recently evaluated for lower extremity cellulitis.  Treated with doxycycline .  Leg is better. Breathing better.  Feels from a cardiac standpoint - stable. No chest pain.  No abdominal pain.  Does report - problem with hemorrhoid. Using otc preparation H. Discussed keeping bowels moving. No bleeding.    Past Medical History:  Diagnosis Date   Allergy    cats and pollen   Arthritis    Asthma    Atrial fibrillation (HCC)    Back pain    Cellulitis    Gout    History of chicken pox    Hyperlipidemia    Hypertension    Kidney stone    Motion sickness    Sleep apnea    Venous insufficiency    Past Surgical History:  Procedure Laterality Date   APPENDECTOMY     CARDIAC CATHETERIZATION     CATARACT EXTRACTION W/PHACO Right 12/26/2021   Procedure: CATARACT EXTRACTION PHACO AND INTRAOCULAR LENS PLACEMENT (IOC) RIGHT;  Surgeon: Mittie Gaskin, MD;  Location: [facility];  Service: Ophthalmology;  Laterality: Right;  4.81 00:44.4   Family History  Problem Relation Age of Onset   Hypertension Mother    Diabetes Father    Kidney disease Neg Hx    Prostate cancer Neg Hx    Social History   Socioeconomic History   Marital status: Married    Spouse name: Not on file   Number of children: Not on file   Years of education: Not on file   Highest education level: Not on file  Occupational History   Occupation: retired  Tobacco Use   Smoking status: Former   Smokeless tobacco: Never   Tobacco comments:    quit 33  years  Vaping Use   Vaping status: Never Used  Substance and Sexual Activity   Alcohol use: No    Alcohol/week: 0.0 standard drinks of alcohol   Drug use: No   Sexual activity: Not on file  Other Topics Concern   Not on file  Social History Narrative   Not on file   Social Drivers of Health   Financial Resource Strain: Low Risk  (04/24/2023)   Received from [facility]   Overall Financial Resource Strain (CARDIA)    Difficulty of Paying Living Expenses: Not very hard  Food Insecurity: No Food Insecurity (04/24/2023)   Received from [facility]   Hunger Vital Sign    Worried About Running Out of Food in the Last Year: Never true    Ran Out of Food in the Last Year: Never true  Transportation Needs: No Transportation Needs (04/24/2023)   Received from [facility] - Transportation    In the past 12 months, has lack of transportation kept you from medical appointments or from getting medications?: No    Lack of Transportation (Non-Medical): No  Physical Activity: Not on file  Stress: Not on file  Social Connections: Not on file     Review of Systems  Constitutional:  Negative for appetite change and unexpected weight change.  HENT:  Negative for congestion and sinus pressure.   Respiratory:  Negative for cough, chest tightness and shortness of breath.        Breathing better.   Cardiovascular:  Negative for chest pain and palpitations.       No increased swelling.    Gastrointestinal:  Negative for abdominal pain, diarrhea, nausea and vomiting.  Genitourinary:  Negative for difficulty urinating and dysuria.  Musculoskeletal:  Negative for joint swelling and myalgias.  Skin:  Negative for color change and rash.  Neurological:  Negative for dizziness and headaches.  Psychiatric/Behavioral:  Negative for agitation and dysphoric mood.        Objective:     BP 118/72   Pulse 66   Temp 98.2 F (36.8 C)    Resp 16   Ht 6' 1 (1.854 m)   Wt 294 lb 9.6 oz (133.6 kg)   SpO2 98%   BMI 38.87 kg/m  Wt Readings from Last 3 Encounters:  06/12/23 294 lb 9.6 oz (133.6 kg)  05/06/23 295 lb 3.2 oz (133.9 kg)  02/04/23 299 lb 6.4 oz (135.8 kg)    Physical Exam Vitals reviewed.  Constitutional:      General: He is not in acute distress.    Appearance: Normal appearance. He is well-developed.  HENT:     Head: Normocephalic and atraumatic.     Right Ear: External ear normal.     Left Ear: External ear normal.     Mouth/Throat:     Pharynx: No oropharyngeal exudate or posterior oropharyngeal erythema.  Eyes:     General: No scleral icterus.       Right eye: No discharge.        Left eye: No discharge.     Conjunctiva/sclera: Conjunctivae normal.  Cardiovascular:     Rate and Rhythm: Normal rate and regular rhythm.  Pulmonary:     Effort: Pulmonary effort is normal. No respiratory distress.     Breath sounds: Normal breath sounds.  Abdominal:     General: Bowel sounds are normal.     Palpations: Abdomen is soft.     Tenderness: There is no abdominal tenderness.  Genitourinary:    Comments: Did not feel needed rectal exam to check hemorrhoid Musculoskeletal:        General: No tenderness.     Cervical back: Neck supple. No tenderness.     Comments: No increased swelling. Left leg larger than right - chronic/stable.   Lymphadenopathy:     Cervical: No cervical adenopathy.  Skin:    Findings: No erythema or rash.  Neurological:     Mental Status: He is alert.  Psychiatric:        Mood and Affect: Mood normal.        Behavior: Behavior normal.      Outpatient Encounter Medications as of 06/12/2023  Medication Sig   hydrocortisone -pramoxine (ANALPRAM HC) 2.5-1 % rectal cream Place 1 Application rectally in the morning and at bedtime.   allopurinol  (ZYLOPRIM ) 100 MG tablet Take 1 tablet (100 mg total) by mouth daily.   amLODipine  (NORVASC ) 10 MG tablet TAKE 1 TABLET(10 MG) BY MOUTH  DAILY   brimonidine  (ALPHAGAN ) 0.2 % ophthalmic solution Place 1 drop into the right eye 2 (two) times daily.   carvedilol  (COREG ) 25 MG tablet TAKE 1 TABLET(25 MG) BY MOUTH TWICE DAILY   docusate sodium  (STOOL SOFTENER) 100 MG capsule Take 100 mg by mouth daily.  finasteride  (PROSCAR ) 5 MG tablet Take 1 tablet (5 mg total) by mouth daily.   furosemide  (LASIX ) 40 MG tablet Take 1 tablet (40 mg total) by mouth daily as needed.   glucosamine-chondroitin 500-400 MG tablet Take 1 tablet by mouth 3 (three) times daily.   lisinopril  (ZESTRIL ) 40 MG tablet TAKE 1 TABLET(40 MG) BY MOUTH DAILY   lovastatin  (MEVACOR ) 20 MG tablet TAKE 1 TABLET(20 MG) BY MOUTH AT BEDTIME   Multiple Vitamin (MULTIVITAMIN) tablet Take 1 tablet by mouth daily.   Omega-3 Fatty Acids (FISH OIL) 1000 MG CPDR Take by mouth.   rivaroxaban  (XARELTO ) 20 MG TABS tablet Take 1 tablet (20 mg total) by mouth daily with supper.   [DISCONTINUED] allopurinol  (ZYLOPRIM ) 100 MG tablet Take 1 tablet (100 mg total) by mouth daily.   [DISCONTINUED] finasteride  (PROSCAR ) 5 MG tablet Take 1 tablet (5 mg total) by mouth daily.   [DISCONTINUED] predniSONE  (DELTASONE ) 20 MG tablet Take one tablet bid for 5 days, then one po q day for 3 days and then 1/2 q day for two days and then stop.   No facility-administered encounter medications on file as of 06/12/2023.     Lab Results  Component Value Date   WBC 9.3 10/14/2022   HGB 15.8 10/14/2022   HCT 46.1 10/14/2022   PLT 185.0 10/14/2022   GLUCOSE 94 06/06/2023   CHOL 102 06/06/2023   TRIG 89.0 06/06/2023   HDL 34.80 (L) 06/06/2023   LDLDIRECT 77.0 04/06/2018   LDLCALC 49 06/06/2023   ALT 18 06/06/2023   AST 19 06/06/2023   NA 142 06/06/2023   K 4.0 06/06/2023   CL 105 06/06/2023   CREATININE 0.99 06/06/2023   BUN 16 06/06/2023   CO2 30 06/06/2023   TSH 2.55 06/06/2023   PSA 0.47 09/28/2021   INR 3.0 10/24/2021   HGBA1C 5.7 06/06/2023    DG Chest 2 View Result Date:  10/14/2022 CLINICAL DATA:  Chest tightness, difficulty breathing. EXAM: CHEST - 2 VIEW COMPARISON:  None Available. FINDINGS: Mild cardiomegaly is noted with central pulmonary vascular congestion. Possible minimal bibasilar subsegmental edema or atelectasis is noted. Bony thorax is unremarkable. IMPRESSION: Mild cardiomegaly with central pulmonary vascular congestion. Possible minimal bibasilar subsegmental atelectasis or edema. Electronically Signed   By: Lynwood Landy Raddle M.D.   On: 10/14/2022 10:34       Assessment & Plan:  Essential hypertension Assessment & Plan: On carvedilol , amlodipine  and lisinopril . Blood pressure as outlined.  Follow met b.  Orders: -     CBC with Differential/Platelet; Future -     Basic metabolic panel; Future  Hypercholesterolemia Assessment & Plan: On lovastatin .  Low cholesterol diet and exercise.  Follow lipid panel and liver function tests.   Orders: -     Lipid panel; Future -     Hepatic function panel; Future  Hyperglycemia Assessment & Plan: Low carb diet and exercise.  Follow met b and a1c.   Orders: -     Hemoglobin A1c; Future  Need for influenza vaccination -     Flu Vaccine Trivalent High Dose (Fluad)  Venous insufficiency Assessment & Plan: Continue compression hose.    Pulmonary hypertension (HCC) Assessment & Plan: Has been evaluated by cardiology.  Using cpap.     Obstructive sleep apnea syndrome Assessment & Plan: Uses cpap regularly.    Atrial fibrillation, unspecified type [facility]) Assessment & Plan: Followed by cardiology. On xarelto .  Continue carvedilol .  Rate controlled. Blood pressure as outlined.  Follow.  Acquired thrombophilia (HCC) Assessment & Plan: Afib. On xarelto .    Sleep apnea, unspecified type Assessment & Plan: Uses cpap regularly.     Hemorrhoids, unspecified hemorrhoid type Assessment & Plan: Reported hemorrhoid. No bleeding. Benefiber to keep bowels moving. Analpram. Follow. Notify me if  persistent.    Other orders -     Allopurinol ; Take 1 tablet (100 mg total) by mouth daily.  Dispense: 90 tablet; Refill: 3 -     Finasteride ; Take 1 tablet (5 mg total) by mouth daily.  Dispense: 90 tablet; Refill: 3 -     Hydrocort -Pramoxine (Perianal); Place 1 Application rectally in the morning and at bedtime.  Dispense: 30 g; Refill: 0     Allena Hamilton, MD

## 2023-06-12 ENCOUNTER — Ambulatory Visit: Payer: Medicare Other | Admitting: Internal Medicine

## 2023-06-12 VITALS — BP 118/72 | HR 66 | Temp 98.2°F | Resp 16 | Ht 73.0 in | Wt 294.6 lb

## 2023-06-12 DIAGNOSIS — G4730 Sleep apnea, unspecified: Secondary | ICD-10-CM | POA: Diagnosis not present

## 2023-06-12 DIAGNOSIS — Z23 Encounter for immunization: Secondary | ICD-10-CM

## 2023-06-12 DIAGNOSIS — I10 Essential (primary) hypertension: Secondary | ICD-10-CM

## 2023-06-12 DIAGNOSIS — E7800 Pure hypercholesterolemia, unspecified: Secondary | ICD-10-CM

## 2023-06-12 DIAGNOSIS — K649 Unspecified hemorrhoids: Secondary | ICD-10-CM | POA: Diagnosis not present

## 2023-06-12 DIAGNOSIS — G4733 Obstructive sleep apnea (adult) (pediatric): Secondary | ICD-10-CM

## 2023-06-12 DIAGNOSIS — I272 Pulmonary hypertension, unspecified: Secondary | ICD-10-CM | POA: Diagnosis not present

## 2023-06-12 DIAGNOSIS — I872 Venous insufficiency (chronic) (peripheral): Secondary | ICD-10-CM | POA: Diagnosis not present

## 2023-06-12 DIAGNOSIS — D6869 Other thrombophilia: Secondary | ICD-10-CM | POA: Diagnosis not present

## 2023-06-12 DIAGNOSIS — I4891 Unspecified atrial fibrillation: Secondary | ICD-10-CM

## 2023-06-12 DIAGNOSIS — R739 Hyperglycemia, unspecified: Secondary | ICD-10-CM

## 2023-06-12 MED ORDER — FINASTERIDE 5 MG PO TABS: 5.0000 mg | ORAL_TABLET | Freq: Every day | ORAL | 3 refills | Status: DC

## 2023-06-12 MED ORDER — ALLOPURINOL 100 MG PO TABS: 100.0000 mg | ORAL_TABLET | Freq: Every day | ORAL | 3 refills | Status: AC

## 2023-06-12 MED ORDER — HYDROCORT-PRAMOXINE (PERIANAL) 2.5-1 % EX CREA: 1.0000 | TOPICAL_CREAM | Freq: Two times a day (BID) | CUTANEOUS | 0 refills | Status: DC

## 2023-06-12 NOTE — Patient Instructions (Signed)
Benefiber - daily 

## 2023-06-13 ENCOUNTER — Telehealth: Payer: Self-pay | Admitting: Cardiovascular Disease

## 2023-06-13 ENCOUNTER — Telehealth: Payer: Self-pay | Admitting: *Deleted

## 2023-06-13 NOTE — Telephone Encounter (Signed)
 Pt has been scheduled tele preop appt 06/26/23. Med rec and consent are done.

## 2023-06-13 NOTE — Telephone Encounter (Signed)
 Primary Cardiologist:Timothy Perla, MD   Preoperative team, please contact this patient and set up a phone call appointment for further preoperative risk assessment. Please obtain consent and complete medication review. Thank you for your help.   Per office protocol, patient can hold Xarelto  for 2 days prior to procedure.   I also confirmed the patient resides in the state of [place] . As per [facility] telemedicine laws, the patient must reside in the state in which the provider is licensed.   Rosaline EMERSON Bane, NP-C  06/13/2023, 12:08 PM [address], [address] Office [phone] Fax [phone]

## 2023-06-13 NOTE — Telephone Encounter (Signed)
 Pt has been scheduled tele preop appt 06/26/23. Med rec and consent are done.     Patient Consent for Virtual Visit        Michael Blevins has provided verbal consent on 06/13/2023 for a virtual visit (video or telephone).   CONSENT FOR VIRTUAL VISIT FOR:  Michael Blevins  By participating in this virtual visit I agree to the following:  I hereby voluntarily request, consent and authorize [facility] and its employed or contracted physicians, physician assistants, nurse practitioners or other licensed health care professionals (the Practitioner), to provide me with telemedicine health care services (the "Services) as deemed necessary by the treating Practitioner. I acknowledge and consent to receive the Services by the Practitioner via telemedicine. I understand that the telemedicine visit will involve communicating with the Practitioner through live audiovisual communication technology and the disclosure of certain medical information by electronic transmission. I acknowledge that I have been given the opportunity to request an in-person assessment or other available alternative prior to the telemedicine visit and am voluntarily participating in the telemedicine visit.  I understand that I have the right to withhold or withdraw my consent to the use of telemedicine in the course of my care at any time, without affecting my right to future care or treatment, and that the Practitioner or I may terminate the telemedicine visit at any time. I understand that I have the right to inspect all information obtained and/or recorded in the course of the telemedicine visit and may receive copies of available information for a reasonable fee.  I understand that some of the potential risks of receiving the Services via telemedicine include:  Delay or interruption in medical evaluation due to technological equipment failure or disruption; Information transmitted may not be sufficient (e.g. poor  resolution of images) to allow for appropriate medical decision making by the Practitioner; and/or  In rare instances, security protocols could fail, causing a breach of personal health information.  Furthermore, I acknowledge that it is my responsibility to provide information about my medical history, conditions and care that is complete and accurate to the best of my ability. I acknowledge that Practitioner's advice, recommendations, and/or decision may be based on factors not within their control, such as incomplete or inaccurate data provided by me or distortions of diagnostic images or specimens that may result from electronic transmissions. I understand that the practice of medicine is not an exact science and that Practitioner makes no warranties or guarantees regarding treatment outcomes. I acknowledge that a copy of this consent can be made available to me via my patient portal [facility] MyChart), or I can request a printed copy by calling the office of [facility].    I understand that my insurance will be billed for this visit.   I have read or had this consent read to me. I understand the contents of this consent, which adequately explains the benefits and risks of the Services being provided via telemedicine.  I have been provided ample opportunity to ask questions regarding this consent and the Services and have had my questions answered to my satisfaction. I give my informed consent for the services to be provided through the use of telemedicine in my medical care

## 2023-06-13 NOTE — Telephone Encounter (Signed)
   Pre-operative Risk Assessment    Patient Name: Michael Blevins  DOB: [birth year] MRN: [id number]   Date of last office visit: 10/21/22 Date of next office visit: n/a   Request for Surgical Clearance    Procedure:   Colonoscopy  Date of Surgery:  Clearance 07/10/23                                Surgeon:  not indicated Surgeon's Group or Practice Name:  [facility] Phone number:  [phone] Fax number:  [phone]   Type of Clearance Requested:   - Pharmacy:  Hold Rivaroxaban  (Xarelto ) before procedure   Type of Anesthesia:  Not Indicated   Additional requests/questions:    SignedTinnie NOVAK Schools   06/13/2023, 11:17 AM

## 2023-06-15 ENCOUNTER — Encounter: Payer: Self-pay | Admitting: Internal Medicine

## 2023-06-15 DIAGNOSIS — K649 Unspecified hemorrhoids: Secondary | ICD-10-CM | POA: Insufficient documentation

## 2023-06-15 NOTE — Assessment & Plan Note (Signed)
 Afib. On xarelto.

## 2023-06-15 NOTE — Assessment & Plan Note (Signed)
Uses cpap regularly.   

## 2023-06-15 NOTE — Assessment & Plan Note (Signed)
 Low carb diet and exercise.  Follow met b and a1c.

## 2023-06-15 NOTE — Assessment & Plan Note (Signed)
 Reported hemorrhoid. No bleeding. Benefiber to keep bowels moving. Analpram. Follow. Notify me if persistent.

## 2023-06-15 NOTE — Assessment & Plan Note (Signed)
On carvedilol, amlodipine and lisinopril. Blood pressure as outlined.  Follow met b.

## 2023-06-15 NOTE — Assessment & Plan Note (Signed)
On lovastatin.  Low cholesterol diet and exercise.  Follow lipid panel and liver function tests.   

## 2023-06-15 NOTE — Assessment & Plan Note (Signed)
Has been evaluated by cardiology.  Using cpap.

## 2023-06-15 NOTE — Assessment & Plan Note (Signed)
Followed by cardiology. On xarelto.  Continue carvedilol.  Rate controlled. Blood pressure as outlined.  Follow.

## 2023-06-15 NOTE — Assessment & Plan Note (Signed)
Continue compression hose.   

## 2023-06-17 ENCOUNTER — Telehealth: Payer: Self-pay

## 2023-06-17 ENCOUNTER — Other Ambulatory Visit (HOSPITAL_COMMUNITY): Payer: Self-pay

## 2023-06-17 NOTE — Telephone Encounter (Signed)
 Pharmacy Patient Advocate Encounter   Received notification from CoverMyMeds that prior authorization for Hyddrocortisone-Pramoxine 2.52-1% cream is required/requested.   Insurance verification completed.   The patient is insured through [facility] .   Per test claim: PA required and submitted KEY/EOC/Request #: [id number] CANCELLED due to

## 2023-06-17 NOTE — Telephone Encounter (Signed)
 Patient is aware

## 2023-06-26 ENCOUNTER — Ambulatory Visit (INDEPENDENT_AMBULATORY_CARE_PROVIDER_SITE_OTHER): Payer: Medicare Other

## 2023-06-26 DIAGNOSIS — I10 Essential (primary) hypertension: Secondary | ICD-10-CM

## 2023-06-26 NOTE — Telephone Encounter (Signed)
PER PREOP APP TODAY PT DID NOT NEED TELE APPT, SHE SENT CLEARANCE TO REQUESTING OFFICE.Marland KitchenMarland KitchenCMF    I s/w the pt and he has been cleared per Bernadene Person, NP and she has faxed clearance notes to requesting office.

## 2023-06-26 NOTE — Telephone Encounter (Signed)
   Patient Name: Michael Blevins  DOB: July 25, 1950 MRN: [id number]  Primary Cardiologist: Julien Nordmann, MD  Clinical pharmacists have reviewed the patient's past medical history, labs, and current medications as part of preoperative protocol coverage. The following recommendations have been made:   Patient with diagnosis of afib on Xarelto for anticoagulation.     Procedure: colonoscopy Date of procedure: TBD     CHA2DS2-VASc Score = 4  This indicates a 4.8% annual risk of stroke. The patient's score is based upon: CHF History: 0 HTN History: 1 Diabetes History: 0 Stroke History: 2 Vascular Disease History: 0 Age Score: 1 Gender Score: 0       CrCl 146 ml/min Platelet count 185   Per office protocol, patient can hold Xarelto for 2 days prior to procedure.  Please resume Xarelto as soon as possible postprocedure, at the discretion of the surgeon.    I will route this recommendation to the requesting party via Epic fax function and remove from pre-op pool.  Please call with questions.  Joylene Grapes, NP 06/26/2023, 8:23 AM

## 2023-07-03 ENCOUNTER — Encounter: Payer: Self-pay | Admitting: Gastroenterology

## 2023-07-09 NOTE — H&P (Signed)
Pre-Procedure H&P   Patient ID: Michael Blevins is a 73 y.o. male.  Gastroenterology Provider: [name], DO  Referring Provider: Tawni Pummel, PA PCP: Dale Sawyer, MD  Date: 07/10/2023  HPI Michael Blevins is a 73 y.o. male who presents today for Colonoscopy for Positive Cologuard .  Patient had positive Cologuard in September 2024.  He has not noted any melena or hematochezia in his stools.  He has no family history of colon cancer or colon polyps.  He reports bowel movement every 2 to 3 days.  Occasional straining. Last colonoscopy 13 years ago in [place], [place] reportedly normal.  Cologuard negative approximately 3 prior  Patient is on Xarelto which been held for this procedure (last dose Monday) Hemoglobin 15.8 MCV 89 platelets 185,000   Past Medical History:  Diagnosis Date   Allergy    cats and pollen   Arthritis    Asthma    Atrial fibrillation (HCC)    Atrial septal defect    Back pain    Cellulitis    Dyspnea    Gout    Hematuria    History of chicken pox    History of kidney stones    Hyperglycemia    Hyperlipidemia    Hypertension    Impaired fasting glucose    Motion sickness    Primary osteoarthritis of left knee    Pulmonary hypertension (HCC)    Sleep apnea    Venous insufficiency     Past Surgical History:  Procedure Laterality Date   APPENDECTOMY     CARDIAC CATHETERIZATION     CATARACT EXTRACTION W/PHACO Right 12/26/2021   Procedure: CATARACT EXTRACTION PHACO AND INTRAOCULAR LENS PLACEMENT (IOC) RIGHT;  Surgeon: Lockie Mola, MD;  Location: [facility];  Service: Ophthalmology;  Laterality: Right;  4.81 00:44.4   ENDOVENOUS ABLATION SAPHENOUS VEIN W/ LASER Right    EYE SURGERY     LEG SKIN LESION  BIOPSY / EXCISION Right     Family History No h/o GI disease or malignancy  Review of Systems  Constitutional:  Negative for activity change, appetite change, chills, diaphoresis, fatigue, fever  and unexpected weight change.  HENT:  Negative for trouble swallowing and voice change.   Respiratory:  Negative for shortness of breath and wheezing.   Cardiovascular:  Negative for chest pain, palpitations and leg swelling.  Gastrointestinal:  Negative for abdominal distention, abdominal pain, anal bleeding, blood in stool, constipation, diarrhea, nausea and vomiting.  Musculoskeletal:  Negative for arthralgias and myalgias.  Skin:  Negative for color change and pallor.  Neurological:  Negative for dizziness, syncope and weakness.  Psychiatric/Behavioral:  Negative for confusion. The patient is not nervous/anxious.   All other systems reviewed and are negative.    Medications No current facility-administered medications on file prior to encounter.   Current Outpatient Medications on File Prior to Encounter  Medication Sig Dispense Refill   brimonidine (ALPHAGAN) 0.2 % ophthalmic solution Place 1 drop into the right eye 2 (two) times daily.     carvedilol (COREG) 25 MG tablet TAKE 1 TABLET(25 MG) BY MOUTH TWICE DAILY 180 tablet 3   furosemide (LASIX) 40 MG tablet Take 1 tablet (40 mg total) by mouth daily as needed. 90 tablet 1   glucosamine-chondroitin 500-400 MG tablet Take 1 tablet by mouth 3 (three) times daily.     lisinopril (ZESTRIL) 40 MG tablet TAKE 1 TABLET(40 MG) BY MOUTH DAILY 90 tablet 3   lovastatin (MEVACOR) 20 MG  tablet TAKE 1 TABLET(20 MG) BY MOUTH AT BEDTIME 90 tablet 1   Multiple Vitamin (MULTIVITAMIN) tablet Take 1 tablet by mouth daily.     Omega-3 Fatty Acids (FISH OIL) 1000 MG CPDR Take by mouth.     docusate sodium (STOOL SOFTENER) 100 MG capsule Take 100 mg by mouth daily.     rivaroxaban (XARELTO) 20 MG TABS tablet Take 1 tablet (20 mg total) by mouth daily with supper. 30 tablet 11    Pertinent medications related to GI and procedure were reviewed by me with the patient prior to the procedure  No current facility-administered medications for this  encounter.      No Known Allergies Allergies were reviewed by me prior to the procedure  Objective   Body mass index is 38.66 kg/m. Vitals:   07/10/23 0957  BP: (!) 148/107  Pulse: 81  Resp: 16  Temp: (!) 96.5 F (35.8 C)  TempSrc: Temporal  SpO2: (!) 66%  Weight: 132.9 kg  Height: 6\' 1"  (1.854 m)     Physical Exam Vitals and nursing note reviewed.  Constitutional:      General: He is not in acute distress.    Appearance: Normal appearance. He is obese. He is not ill-appearing, toxic-appearing or diaphoretic.  HENT:     Head: Normocephalic and atraumatic.     Nose: Nose normal.     Mouth/Throat:     Mouth: Mucous membranes are moist.     Pharynx: Oropharynx is clear.  Eyes:     General: No scleral icterus.    Extraocular Movements: Extraocular movements intact.  Cardiovascular:     Rate and Rhythm: Normal rate and regular rhythm.     Heart sounds: Normal heart sounds. No murmur heard.    No friction rub. No gallop.  Pulmonary:     Effort: Pulmonary effort is normal. No respiratory distress.     Breath sounds: Normal breath sounds. No wheezing, rhonchi or rales.  Abdominal:     General: Bowel sounds are normal. There is no distension.     Palpations: Abdomen is soft.     Tenderness: There is no abdominal tenderness. There is no guarding or rebound.  Musculoskeletal:     Cervical back: Neck supple.     Right lower leg: No edema.     Left lower leg: No edema.  Skin:    General: Skin is warm and dry.     Coloration: Skin is not jaundiced or pale.  Neurological:     General: No focal deficit present.     Mental Status: He is alert and oriented to person, place, and time. Mental status is at baseline.  Psychiatric:        Mood and Affect: Mood normal.        Behavior: Behavior normal.        Thought Content: Thought content normal.        Judgment: Judgment normal.      Assessment:  Michael Blevins is a 73 y.o. male  who presents today for  Colonoscopy for positive Cologuard.  Plan:  Colonoscopy with possible intervention today  Colonoscopy with possible biopsy, control of bleeding, polypectomy, and interventions as necessary has been discussed with the patient/patient representative. Informed consent was obtained from the patient/patient representative after explaining the indication, nature, and risks of the procedure including but not limited to death, bleeding, perforation, missed neoplasm/lesions, cardiorespiratory compromise, and reaction to medications. Opportunity for questions was given and appropriate answers were  provided. Patient/patient representative has verbalized understanding is amenable to undergoing the procedure.   [name], DO  [facility]  Portions of the record may have been created with voice recognition software. Occasional wrong-word or 'sound-a-like' substitutions may have occurred due to the inherent limitations of voice recognition software.  Read the chart carefully and recognize, using context, where substitutions may have occurred.

## 2023-07-10 ENCOUNTER — Encounter: Payer: Self-pay | Admitting: Gastroenterology

## 2023-07-10 ENCOUNTER — Other Ambulatory Visit: Payer: Self-pay

## 2023-07-10 ENCOUNTER — Encounter
Admission: RE | Disposition: A | Discharge status: Home / Self Care | Payer: Self-pay | Source: Ambulatory Visit | Attending: Gastroenterology

## 2023-07-10 ENCOUNTER — Ambulatory Visit: Payer: Medicare Other | Admitting: Anesthesiology

## 2023-07-10 ENCOUNTER — Ambulatory Visit
Admission: RE | Admit: 2023-07-10 | Discharge: 2023-07-10 | Disposition: A | Discharge status: Home / Self Care | Payer: Medicare Other | Source: Ambulatory Visit | Attending: Gastroenterology | Admitting: Gastroenterology

## 2023-07-10 DIAGNOSIS — K5730 Diverticulosis of large intestine without perforation or abscess without bleeding: Secondary | ICD-10-CM | POA: Insufficient documentation

## 2023-07-10 DIAGNOSIS — K640 First degree hemorrhoids: Secondary | ICD-10-CM | POA: Diagnosis not present

## 2023-07-10 DIAGNOSIS — Z1211 Encounter for screening for malignant neoplasm of colon: Secondary | ICD-10-CM | POA: Insufficient documentation

## 2023-07-10 DIAGNOSIS — R195 Other fecal abnormalities: Secondary | ICD-10-CM | POA: Insufficient documentation

## 2023-07-10 DIAGNOSIS — D123 Benign neoplasm of transverse colon: Secondary | ICD-10-CM | POA: Insufficient documentation

## 2023-07-10 DIAGNOSIS — I10 Essential (primary) hypertension: Secondary | ICD-10-CM | POA: Insufficient documentation

## 2023-07-10 DIAGNOSIS — Z87891 Personal history of nicotine dependence: Secondary | ICD-10-CM | POA: Diagnosis not present

## 2023-07-10 DIAGNOSIS — I4891 Unspecified atrial fibrillation: Secondary | ICD-10-CM | POA: Insufficient documentation

## 2023-07-10 DIAGNOSIS — J45909 Unspecified asthma, uncomplicated: Secondary | ICD-10-CM | POA: Insufficient documentation

## 2023-07-10 DIAGNOSIS — G4730 Sleep apnea, unspecified: Secondary | ICD-10-CM | POA: Insufficient documentation

## 2023-07-10 DIAGNOSIS — D122 Benign neoplasm of ascending colon: Secondary | ICD-10-CM | POA: Diagnosis not present

## 2023-07-10 DIAGNOSIS — K635 Polyp of colon: Secondary | ICD-10-CM | POA: Insufficient documentation

## 2023-07-10 DIAGNOSIS — Z7901 Long term (current) use of anticoagulants: Secondary | ICD-10-CM | POA: Insufficient documentation

## 2023-07-10 DIAGNOSIS — K649 Unspecified hemorrhoids: Secondary | ICD-10-CM | POA: Diagnosis not present

## 2023-07-10 HISTORY — DX: Impaired fasting glucose: R73.01

## 2023-07-10 HISTORY — PX: COLONOSCOPY WITH PROPOFOL: SHX5780

## 2023-07-10 HISTORY — DX: Pulmonary hypertension, unspecified: I27.20

## 2023-07-10 HISTORY — DX: Dyspnea, unspecified: R06.00

## 2023-07-10 HISTORY — DX: Unilateral primary osteoarthritis, left knee: M17.12

## 2023-07-10 HISTORY — DX: Personal history of urinary calculi: Z87.442

## 2023-07-10 HISTORY — DX: Hematuria, unspecified: R31.9

## 2023-07-10 HISTORY — DX: Hyperglycemia, unspecified: R73.9

## 2023-07-10 HISTORY — PX: POLYPECTOMY: SHX5525

## 2023-07-10 HISTORY — DX: Atrial septal defect, unspecified: Q21.10

## 2023-07-10 SURGERY — COLONOSCOPY WITH PROPOFOL
Anesthesia: General

## 2023-07-10 MED ORDER — PROPOFOL 10 MG/ML IV BOLUS
INTRAVENOUS | Status: DC | PRN
  Administered 2023-07-10: 30 mg via INTRAVENOUS
  Administered 2023-07-10: 50 mg via INTRAVENOUS

## 2023-07-10 MED ORDER — DEXMEDETOMIDINE HCL IN NACL 80 MCG/20ML IV SOLN
INTRAVENOUS | Status: DC | PRN
  Administered 2023-07-10: 20 ug via INTRAVENOUS

## 2023-07-10 MED ORDER — LIDOCAINE HCL (PF) 2 % IJ SOLN
INTRAMUSCULAR | Status: AC
  Filled 2023-07-10: qty 5

## 2023-07-10 MED ORDER — SODIUM CHLORIDE 0.9 % IV SOLN: INTRAVENOUS | Status: DC

## 2023-07-10 MED ORDER — LIDOCAINE HCL (CARDIAC) PF 100 MG/5ML IV SOSY
PREFILLED_SYRINGE | INTRAVENOUS | Status: DC | PRN
  Administered 2023-07-10: 100 mg via INTRAVENOUS

## 2023-07-10 MED ORDER — PROPOFOL 500 MG/50ML IV EMUL
INTRAVENOUS | Status: DC | PRN
  Administered 2023-07-10: 75 ug/kg/min via INTRAVENOUS

## 2023-07-10 NOTE — Transfer of Care (Signed)
Immediate Anesthesia Transfer of Care Note  Patient: Michael Blevins  Procedure(s) Performed: COLONOSCOPY WITH PROPOFOL POLYPECTOMY  Patient Location: PACU  Anesthesia Type:General  Level of Consciousness: sedated  Airway & Oxygen Therapy: Patient Spontanous Breathing  Post-op Assessment: Report given to RN and Post -op Vital signs reviewed and stable  Post vital signs: Reviewed and stable  Last Vitals:  Vitals Value Taken Time  BP 107/67 07/10/23 1104  Temp 35.1 C 07/10/23 1100  Pulse 69 07/10/23 1105  Resp 13 07/10/23 1105  SpO2 97 % 07/10/23 1105  Vitals shown include unfiled device data.  Last Pain:  Vitals:   07/10/23 1100  TempSrc: Temporal  PainSc:          Complications: No notable events documented.

## 2023-07-10 NOTE — Interval H&P Note (Signed)
History and Physical Interval Note: Preprocedure H&P from 07/10/23  was reviewed and there was no interval change after seeing and examining the patient.  Written consent was obtained from the patient after discussion of risks, benefits, and alternatives. Patient has consented to proceed with Colonoscopy with possible intervention  07/10/2023 10:04 AM  Michael Blevins  has presented today for surgery, with the diagnosis of R19.5 (ICD-10-CM) - Positive colorectal cancer screening using Cologuard test Z79.01 (ICD-10-CM) - Anticoagulant long-term use.  The various methods of treatment have been discussed with the patient and family. After consideration of risks, benefits and other options for treatment, the patient has consented to  Procedure(s): COLONOSCOPY WITH PROPOFOL (N/A) as a surgical intervention.  The patient's history has been reviewed, patient examined, no change in status, stable for surgery.  I have reviewed the patient's chart and labs.  Questions were answered to the patient's satisfaction.     [name]

## 2023-07-10 NOTE — Op Note (Signed)
[facility] Patient Name: Michael Blevins Procedure Date: 07/10/2023 9:56 AM MRN: [id number] Account #: [id number] Date of Birth: 09/06/1950 Admit Type: Outpatient Age: 73 Room: [facility] ENDO ROOM 1 Gender: Male Note Status: Finalized Instrument Name: Colonoscope [id number] Procedure:             Colonoscopy Indications:           Positive Cologuard test Providers:             [name] DO, DO Referring MD:          Dale [place], MD (Referring MD) Medicines:             Monitored Anesthesia Care Complications:         No immediate complications. Estimated blood loss:                         Minimal. Procedure:             Pre-Anesthesia Assessment:                        - Prior to the procedure, a History and Physical was                         performed, and patient medications and allergies were                         reviewed. The patient is competent. The risks and                         benefits of the procedure and the sedation options and                         risks were discussed with the patient. All questions                         were answered and informed consent was obtained.                         Patient identification and proposed procedure were                         verified by the physician, the nurse, the anesthetist                         and the technician in the endoscopy suite. Mental                         Status Examination: alert and oriented. Airway                         Examination: normal oropharyngeal airway and neck                         mobility. Respiratory Examination: clear to                         auscultation. CV Examination: RRR, no murmurs, no S3  or S4. Prophylactic Antibiotics: The patient does not                         require prophylactic antibiotics. Prior                         Anticoagulants: The patient has taken no anticoagulant                          or antiplatelet agents. ASA Grade Assessment: III - A                         patient with severe systemic disease. After reviewing                         the risks and benefits, the patient was deemed in                         satisfactory condition to undergo the procedure. The                         anesthesia plan was to use monitored anesthesia care                         (MAC). Immediately prior to administration of                         medications, the patient was re-assessed for adequacy                         to receive sedatives. The heart rate, respiratory                         rate, oxygen saturations, blood pressure, adequacy of                         pulmonary ventilation, and response to care were                         monitored throughout the procedure. The physical                         status of the patient was re-assessed after the                         procedure.                        After obtaining informed consent, the colonoscope was                         passed under direct vision. Throughout the procedure,                         the patient's blood pressure, pulse, and oxygen                         saturations were monitored continuously. The  Colonoscope was introduced through the anus and                         advanced to the the terminal ileum, with                         identification of the appendiceal orifice and IC                         valve. The colonoscopy was performed without                         difficulty. The patient tolerated the procedure well.                         The quality of the bowel preparation was evaluated                         using the BBPS [facility] Bowel Preparation Scale) with                         scores of: Right Colon = 1 (portion of mucosa seen,                         but other areas not well seen due to staining,                         residual stool and/or opaque  liquid), Transverse Colon                         = 2 (minor amount of residual staining, small                         fragments of stool and/or opaque liquid, but mucosa                         seen well) and Left Colon = 2 (minor amount of                         residual staining, small fragments of stool and/or                         opaque liquid, but mucosa seen well). The total BBPS                         score equals 5. The quality of the bowel preparation                         was inadequate. large amount of seeds and residue                         frequently clogging the scope and suction. The                         ileocecal valve, appendiceal orifice, and rectum were  photographed. Findings:      The perianal and digital rectal examinations were normal. Pertinent       negatives include normal sphincter tone.      Extensive amounts of semi-liquid semi-solid stool was found in the       entire colon, interfering with visualization. Lavage of the area was       performed using a large amount, resulting in incomplete clearance with       fair visualization. Large amoutn of seeds frequently clogging the scope       and suction. Estimated blood loss: none.      Multiple small-mouthed diverticula were found in the sigmoid colon.       Estimated blood loss: none.      Non-bleeding internal hemorrhoids were found during retroflexion. The       hemorrhoids were Grade I (internal hemorrhoids that do not prolapse).       Estimated blood loss: none.      Four sessile polyps were found in the transverse colon (2) and ascending       colon (2). The polyps were 6 to 12 mm in size. These polyps were removed       with a cold snare. Resection and retrieval were complete. Estimated       blood loss was minimal.      A 1 to 2 mm polyp was found in the transverse colon. The polyp was       sessile. The polyp was removed with a jumbo cold forceps. Resection and        retrieval were complete. Estimated blood loss was minimal.      Retroflexion in the right colon was performed. Impression:            - Preparation of the colon was inadequate.                        - Stool in the entire examined colon.                        - Diverticulosis in the sigmoid colon.                        - Non-bleeding internal hemorrhoids.                        - Four 6 to 12 mm polyps in the transverse colon and                         in the ascending colon, removed with a cold snare.                         Resected and retrieved.                        - One 1 to 2 mm polyp in the transverse colon, removed                         with a jumbo cold forceps. Resected and retrieved. Recommendation:        - Patient has a contact number available for  emergencies. The signs and symptoms of potential                         delayed complications were discussed with the patient.                         Return to normal activities tomorrow. Written                         discharge instructions were provided to the patient.                        - Discharge patient to home.                        - Resume previous diet.                        - Continue present medications.                        - No ibuprofen, naproxen, or other non-steroidal                         anti-inflammatory drugs for 5 days after polyp removal.                        - Resume Xarelto (rivaroxaban) at prior dose in 3                         days. Refer to managing physician for further                         adjustment of therapy.                        - Await pathology results.                        - Repeat colonoscopy in 1 month because the bowel                         preparation was poor.                        - Return to GI office as previously scheduled.                        - The findings and recommendations were discussed with                         the  patient.                        - The findings and recommendations were discussed with                         the patient's family. Procedure Code(s):     --- Professional ---                        [phone], Colonoscopy,  flexible; with removal of                         tumor(s), polyp(s), or other lesion(s) by snare                         technique                        45380, 59, Colonoscopy, flexible; with biopsy, single                         or multiple Diagnosis Code(s):     --- Professional ---                        K64.0, First degree hemorrhoids                        D12.3, Benign neoplasm of transverse colon (hepatic                         flexure or splenic flexure)                        D12.2, Benign neoplasm of ascending colon                        R19.5, Other fecal abnormalities                        K57.30, Diverticulosis of large intestine without                         perforation or abscess without bleeding CPT copyright 2022 American Medical Association. All rights reserved. The codes documented in this report are preliminary and upon coder review may  be revised to meet current compliance requirements. Attending Participation:      I personally performed the entire procedure. Elfredia Nevins, DO [name] DO, DO 07/10/2023 11:06:40 AM This report has been signed electronically. Number of Addenda: 0 Note Initiated On: 07/10/2023 9:56 AM Scope Withdrawal Time: 0 hours 25 minutes 42 seconds  Total Procedure Duration: 0 hours 36 minutes 11 seconds  Estimated Blood Loss:  Estimated blood loss was minimal.      [facility]

## 2023-07-10 NOTE — Anesthesia Preprocedure Evaluation (Signed)
Anesthesia Evaluation  Patient identified by MRN, date of birth, ID band Patient awake    Reviewed: Allergy & Precautions, NPO status , Patient's Chart, lab work & pertinent test results  Airway Mallampati: II  TM Distance: >3 FB Neck ROM: Full    Dental no notable dental hx. (+) Chipped, Dental Advidsory Given   Pulmonary neg pulmonary ROS, asthma , sleep apnea and Continuous Positive Airway Pressure Ventilation , former smoker   Pulmonary exam normal        Cardiovascular hypertension, negative cardio ROS Normal cardiovascular exam+ dysrhythmias Atrial Fibrillation      Neuro/Psych negative neurological ROS  negative psych ROS   GI/Hepatic negative GI ROS, Neg liver ROS,,,  Endo/Other  negative endocrine ROS    Renal/GU negative Renal ROS  negative genitourinary   Musculoskeletal   Abdominal   Peds  Hematology negative hematology ROS (+)   Anesthesia Other Findings Past Medical History: No date: Allergy     Comment:  cats and pollen No date: Arthritis No date: Asthma No date: Atrial fibrillation (HCC) No date: Atrial septal defect No date: Back pain No date: Cellulitis No date: Dyspnea No date: Gout No date: Hematuria No date: History of chicken pox No date: History of kidney stones No date: Hyperglycemia No date: Hyperlipidemia No date: Hypertension No date: Impaired fasting glucose No date: Motion sickness No date: Primary osteoarthritis of left knee No date: Pulmonary hypertension (HCC) No date: Sleep apnea No date: Venous insufficiency  Past Surgical History: No date: APPENDECTOMY No date: CARDIAC CATHETERIZATION 12/26/2021: CATARACT EXTRACTION W/PHACO; Right     Comment:  Procedure: CATARACT EXTRACTION PHACO AND INTRAOCULAR               LENS PLACEMENT (IOC) RIGHT;  Surgeon: Lockie Mola, MD;  Location: [facility];  Service:               Ophthalmology;   Laterality: Right;  4.81 00:44.4 No date: ENDOVENOUS ABLATION SAPHENOUS VEIN W/ LASER; Right No date: EYE SURGERY No date: LEG SKIN LESION  BIOPSY / EXCISION; Right  BMI    Body Mass Index: 38.66 kg/m      Reproductive/Obstetrics negative OB ROS                             Anesthesia Physical Anesthesia Plan  ASA: 3  Anesthesia Plan: General   Post-op Pain Management: Minimal or no pain anticipated   Induction: Intravenous  PONV Risk Score and Plan: 3 and Propofol infusion, TIVA and Ondansetron  Airway Management Planned: Nasal Cannula  Additional Equipment: None  Intra-op Plan:   Post-operative Plan:   Informed Consent: I have reviewed the patients History and Physical, chart, labs and discussed the procedure including the risks, benefits and alternatives for the proposed anesthesia with the patient or authorized representative who has indicated his/her understanding and acceptance.     Dental advisory given  Plan Discussed with: CRNA and Surgeon  Anesthesia Plan Comments: (Discussed risks of anesthesia with patient, including possibility of difficulty with spontaneous ventilation under anesthesia necessitating airway intervention, PONV, and rare risks such as cardiac or respiratory or neurological events, and allergic reactions. Discussed the role of CRNA in patient's perioperative care. Patient understands.)       Anesthesia Quick Evaluation

## 2023-07-10 NOTE — Anesthesia Postprocedure Evaluation (Signed)
Anesthesia Post Note  Patient: Michael Blevins  Procedure(s) Performed: COLONOSCOPY WITH PROPOFOL POLYPECTOMY  Patient location during evaluation: PACU Anesthesia Type: General Level of consciousness: awake and alert Pain management: pain level controlled Vital Signs Assessment: post-procedure vital signs reviewed and stable Respiratory status: spontaneous breathing, nonlabored ventilation, respiratory function stable and patient connected to nasal cannula oxygen Cardiovascular status: blood pressure returned to baseline and stable Postop Assessment: no apparent nausea or vomiting Anesthetic complications: no  No notable events documented.   Last Vitals:  Vitals:   07/10/23 1113 07/10/23 1114  BP:  110/81  Pulse: 69 84  Resp: 20 20  Temp:    SpO2: 96% 96%    Last Pain:  Vitals:   07/10/23 1100  TempSrc: Temporal  PainSc:                  Stephanie Coup

## 2023-07-11 LAB — SURGICAL PATHOLOGY

## 2023-07-14 ENCOUNTER — Encounter: Payer: Self-pay | Admitting: Gastroenterology

## 2023-08-04 DIAGNOSIS — H40003 Preglaucoma, unspecified, bilateral: Secondary | ICD-10-CM | POA: Diagnosis not present

## 2023-08-05 ENCOUNTER — Encounter: Payer: Self-pay | Admitting: Family

## 2023-08-05 ENCOUNTER — Ambulatory Visit (INDEPENDENT_AMBULATORY_CARE_PROVIDER_SITE_OTHER): Payer: Medicare Other | Admitting: Family

## 2023-08-05 VITALS — BP 128/82 | HR 81 | Temp 97.9°F | Ht 73.0 in | Wt 296.6 lb

## 2023-08-05 DIAGNOSIS — Z23 Encounter for immunization: Secondary | ICD-10-CM | POA: Diagnosis not present

## 2023-08-05 DIAGNOSIS — W548XXA Other contact with dog, initial encounter: Secondary | ICD-10-CM

## 2023-08-05 DIAGNOSIS — S61419A Laceration without foreign body of unspecified hand, initial encounter: Secondary | ICD-10-CM

## 2023-08-05 MED ORDER — DOXYCYCLINE HYCLATE 100 MG PO TABS: 100.0000 mg | ORAL_TABLET | Freq: Two times a day (BID) | ORAL | 0 refills | Status: AC

## 2023-08-05 MED ORDER — AMLODIPINE BESYLATE 10 MG PO TABS: ORAL_TABLET | ORAL | 0 refills | Status: DC

## 2023-08-05 NOTE — Assessment & Plan Note (Signed)
 Nontoxic in appearance . previous warmth, erythema has improved as patient started course of doxycycline at home.  Advised him to complete doxycycline 100 mg twice daily x 6 days.  I refilled doxycycline as he likes to keep this on hand for recurrent lower extremity cellulitis He will let me know any concerns infection. Tdap given.

## 2023-08-05 NOTE — Patient Instructions (Signed)
 Complete doxycycline; I have sent in a refill for you to use in the future if needed  Ensure to take probiotics while on antibiotics and also for 2 weeks after completion. This can either be by eating yogurt daily or taking a probiotic supplement over the counter such as Culturelle.It is important to re-colonize the gut with good bacteria and also to prevent any diarrheal infections associated with antibiotic use.    Please let me know of any concerns in regards to infection

## 2023-08-05 NOTE — Progress Notes (Addendum)
 Assessment & Plan:  Laceration of dorsum of hand  Dog scratch Assessment & Plan: Nontoxic in appearance . previous warmth, erythema has improved as patient started course of doxycycline at home.  Advised him to complete doxycycline 100 mg twice daily x 6 days.  I refilled doxycycline as he likes to keep this on hand for recurrent lower extremity cellulitis He will let me know any concerns infection. Tdap given.     Need for diphtheria-tetanus-pertussis (Tdap) vaccine -     Tdap vaccine greater than or equal to 7yo IM  Other orders -     amLODIPine Besylate; Take 1 tablet (10 mg) by mouth daily  Dispense: 90 tablet; Refill: 0 -     Doxycycline Hyclate; Take 1 tablet (100 mg total) by mouth 2 (two) times daily for 7 days.  Dispense: 14 tablet; Refill: 0     Return precautions given.   Risks, benefits, and alternatives of the medications and treatment plan prescribed today were discussed, and patient expressed understanding.   Education regarding symptom management and diagnosis given to patient on AVS either electronically or printed.  No follow-ups on file.  Rennie Plowman, FNP  Subjective:    Patient ID: Michael Blevins, male    DOB: 1951-05-29, 73 y.o.   MRN: [id number]  CC: Michael Blevins is a 73 y.o. male who presents today for an acute visit.    HPI: here today acute visit for dog scratch on his right hand x one week ago.  Accompanied by wife  About 4 days ago , he around wound increased warmth.  He has a history of right leg recurrent cellulitis.  He keeps doxycycline on hand.  He started started doxycyline 100mg  BID 6 day course.    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 Dog is up-to-date on vaccines.  No drainage from the wound. Denies fever  He cleaned wound right after it happened chlorhexidine and later used Bactroban ointment.     No history of CKD Allergies: Patient has no known allergies. Current Outpatient Medications on File Prior to Visit  Medication Sig  Dispense Refill   allopurinol (ZYLOPRIM) 100 MG tablet Take 1 tablet (100 mg total) by mouth daily. 90 tablet 3   brimonidine (ALPHAGAN) 0.2 % ophthalmic solution Place 1 drop into the right eye 2 (two) times daily.     carvedilol (COREG) 25 MG tablet TAKE 1 TABLET(25 MG) BY MOUTH TWICE DAILY 180 tablet 3   docusate sodium (STOOL SOFTENER) 100 MG capsule Take 100 mg by mouth daily.     finasteride (PROSCAR) 5 MG tablet Take 1 tablet (5 mg total) by mouth daily. 90 tablet 3   furosemide (LASIX) 40 MG tablet Take 1 tablet (40 mg total) by mouth daily as needed. 90 tablet 1   glucosamine-chondroitin 500-400 MG tablet Take 1 tablet by mouth 3 (three) times daily.     lisinopril (ZESTRIL) 40 MG tablet TAKE 1 TABLET(40 MG) BY MOUTH DAILY 90 tablet 3   lovastatin (MEVACOR) 20 MG tablet TAKE 1 TABLET(20 MG) BY MOUTH AT BEDTIME 90 tablet 1   Multiple Vitamin (MULTIVITAMIN) tablet Take 1 tablet by mouth daily.     Omega-3 Fatty Acids (FISH OIL) 1000 MG CPDR Take by mouth.     rivaroxaban (XARELTO) 20 MG TABS tablet Take 1 tablet (20 mg total) by mouth daily with supper. 30 tablet 11   No current facility-administered medications on file prior to visit.    Review of Systems  Constitutional:  Negative for chills and fever.  Respiratory:  Negative for cough.   Cardiovascular:  Negative for chest pain and palpitations.  Gastrointestinal:  Negative for nausea and vomiting.  Skin:  Positive for wound.      Objective:    BP 128/82   Pulse 81   Temp 97.9 F (36.6 C) (Oral)   Ht 6\' 1"  (1.854 m)   Wt 296 lb 9.6 oz (134.5 kg)   SpO2 97%   BMI 39.13 kg/m   BP Readings from Last 3 Encounters:  08/05/23 128/82  07/10/23 127/83  06/12/23 118/72   Wt Readings from Last 3 Encounters:  08/05/23 296 lb 9.6 oz (134.5 kg)  07/10/23 293 lb (132.9 kg)  06/12/23 294 lb 9.6 oz (133.6 kg)    Physical Exam Vitals reviewed.  Constitutional:      Appearance: He is well-developed.  Cardiovascular:      Rate and Rhythm: Regular rhythm.     Heart sounds: Normal heart sounds.  Pulmonary:     Effort: Pulmonary effort is normal. No respiratory distress.     Breath sounds: Normal breath sounds. No wheezing, rhonchi or rales.  Musculoskeletal:     Comments: 3 well-approximated and scabbed lesions dorsal aspect right hand.  Longest approximately 1 cm.  No purulent discharge, increased warmth, tenderness with palpation. Resolving hematoma around laceration  Skin:    General: Skin is warm and dry.  Neurological:     Mental Status: He is alert.  Psychiatric:        Speech: Speech normal.        Behavior: Behavior normal.

## 2023-08-10 NOTE — Addendum Note (Signed)
 Addended by: Allegra Grana on: 08/10/2023 07:34 AM   Modules accepted: Level of Service

## 2023-08-26 ENCOUNTER — Other Ambulatory Visit: Payer: Self-pay | Admitting: Cardiovascular Disease

## 2023-08-27 ENCOUNTER — Ambulatory Visit: Admitting: Podiatry

## 2023-09-02 ENCOUNTER — Encounter: Payer: Self-pay | Admitting: Gastroenterology

## 2023-09-03 ENCOUNTER — Encounter: Payer: Self-pay | Admitting: Podiatry

## 2023-09-03 ENCOUNTER — Ambulatory Visit (INDEPENDENT_AMBULATORY_CARE_PROVIDER_SITE_OTHER): Admitting: Podiatry

## 2023-09-03 DIAGNOSIS — L600 Ingrowing nail: Secondary | ICD-10-CM | POA: Diagnosis not present

## 2023-09-03 NOTE — Progress Notes (Signed)
 She presents today chief complaint of painful ingrown toenail to the tibial-fibular border the hallux right.  Previous matricectomy's were performed several years ago he is doing well with it until recently where he noticed that he started develop some burning along the tibial border.  He has some tenderness along the fibular border of the same hallux.  No purulence no malodor he has not noticed any drainage.  Objective: Vital signs stable oriented x 3 there is no erythema edema salines drainage odor tibial nail margin is clear from the nail fold and the fibular margin is incurvated and sharply pressing on soft tissue.  No purulence no malodor no signs of infection just tenderness on palpation of both borders.  Assessment: Ingrown nail fibular border of the hallux right.  Plan: I debrided the margin and the borders of the neck today alleviating his symptoms and I will follow-up with him as needed.

## 2023-09-04 ENCOUNTER — Encounter: Payer: Self-pay | Admitting: Gastroenterology

## 2023-09-11 ENCOUNTER — Encounter: Payer: Self-pay | Admitting: Gastroenterology

## 2023-09-11 ENCOUNTER — Encounter
Admission: RE | Disposition: A | Discharge status: Home / Self Care | Payer: Self-pay | Source: Ambulatory Visit | Attending: Gastroenterology

## 2023-09-11 ENCOUNTER — Ambulatory Visit: Admitting: Anesthesiology

## 2023-09-11 ENCOUNTER — Ambulatory Visit
Admission: RE | Admit: 2023-09-11 | Discharge: 2023-09-11 | Disposition: A | Discharge status: Home / Self Care | Payer: Medicare Other | Source: Ambulatory Visit | Attending: Gastroenterology | Admitting: Gastroenterology

## 2023-09-11 DIAGNOSIS — J45909 Unspecified asthma, uncomplicated: Secondary | ICD-10-CM | POA: Insufficient documentation

## 2023-09-11 DIAGNOSIS — I10 Essential (primary) hypertension: Secondary | ICD-10-CM | POA: Diagnosis not present

## 2023-09-11 DIAGNOSIS — G4730 Sleep apnea, unspecified: Secondary | ICD-10-CM | POA: Insufficient documentation

## 2023-09-11 DIAGNOSIS — I872 Venous insufficiency (chronic) (peripheral): Secondary | ICD-10-CM | POA: Diagnosis not present

## 2023-09-11 DIAGNOSIS — I4891 Unspecified atrial fibrillation: Secondary | ICD-10-CM | POA: Diagnosis not present

## 2023-09-11 DIAGNOSIS — D124 Benign neoplasm of descending colon: Secondary | ICD-10-CM | POA: Diagnosis not present

## 2023-09-11 DIAGNOSIS — K635 Polyp of colon: Secondary | ICD-10-CM | POA: Diagnosis not present

## 2023-09-11 DIAGNOSIS — E669 Obesity, unspecified: Secondary | ICD-10-CM | POA: Insufficient documentation

## 2023-09-11 DIAGNOSIS — Z7901 Long term (current) use of anticoagulants: Secondary | ICD-10-CM | POA: Diagnosis not present

## 2023-09-11 DIAGNOSIS — K5909 Other constipation: Secondary | ICD-10-CM | POA: Insufficient documentation

## 2023-09-11 DIAGNOSIS — Z1211 Encounter for screening for malignant neoplasm of colon: Secondary | ICD-10-CM | POA: Insufficient documentation

## 2023-09-11 DIAGNOSIS — Z79899 Other long term (current) drug therapy: Secondary | ICD-10-CM | POA: Diagnosis not present

## 2023-09-11 DIAGNOSIS — Z6833 Body mass index (BMI) 33.0-33.9, adult: Secondary | ICD-10-CM | POA: Insufficient documentation

## 2023-09-11 DIAGNOSIS — K640 First degree hemorrhoids: Secondary | ICD-10-CM | POA: Insufficient documentation

## 2023-09-11 DIAGNOSIS — D122 Benign neoplasm of ascending colon: Secondary | ICD-10-CM | POA: Diagnosis not present

## 2023-09-11 DIAGNOSIS — Z87891 Personal history of nicotine dependence: Secondary | ICD-10-CM | POA: Diagnosis not present

## 2023-09-11 DIAGNOSIS — R195 Other fecal abnormalities: Secondary | ICD-10-CM | POA: Insufficient documentation

## 2023-09-11 DIAGNOSIS — K5730 Diverticulosis of large intestine without perforation or abscess without bleeding: Secondary | ICD-10-CM | POA: Insufficient documentation

## 2023-09-11 DIAGNOSIS — K649 Unspecified hemorrhoids: Secondary | ICD-10-CM | POA: Diagnosis not present

## 2023-09-11 HISTORY — PX: COLONOSCOPY: SHX5424

## 2023-09-11 HISTORY — DX: Shortness of breath: R06.02

## 2023-09-11 HISTORY — PX: POLYPECTOMY: SHX5525

## 2023-09-11 SURGERY — COLONOSCOPY
Anesthesia: General

## 2023-09-11 MED ORDER — PROPOFOL 500 MG/50ML IV EMUL
INTRAVENOUS | Status: DC | PRN
  Administered 2023-09-11: 165 ug/kg/min via INTRAVENOUS

## 2023-09-11 MED ORDER — SODIUM CHLORIDE 0.9 % IV SOLN: INTRAVENOUS | Status: DC

## 2023-09-11 MED ORDER — LIDOCAINE HCL (CARDIAC) PF 100 MG/5ML IV SOSY
PREFILLED_SYRINGE | INTRAVENOUS | Status: DC | PRN
  Administered 2023-09-11: 100 mg via INTRAVENOUS

## 2023-09-11 MED ORDER — GLYCOPYRROLATE 0.2 MG/ML IJ SOLN
INTRAMUSCULAR | Status: DC | PRN
  Administered 2023-09-11: .2 mg via INTRAVENOUS

## 2023-09-11 MED ORDER — PROPOFOL 10 MG/ML IV BOLUS
INTRAVENOUS | Status: DC | PRN
  Administered 2023-09-11: 20 mg via INTRAVENOUS
  Administered 2023-09-11: 60 mg via INTRAVENOUS

## 2023-09-11 MED ORDER — EPHEDRINE SULFATE-NACL 50-0.9 MG/10ML-% IV SOSY
PREFILLED_SYRINGE | INTRAVENOUS | Status: DC | PRN
  Administered 2023-09-11: 5 mg via INTRAVENOUS

## 2023-09-11 NOTE — H&P (Signed)
 Pre-Procedure H&P   Patient ID: Michael Blevins is a 73 y.o. male.  Gastroenterology Provider: [name], DO  Referring Provider: Tawni Pummel, PA PCP: Dale , MD  Date: 09/11/2023  HPI Michael Blevins is a 73 y.o. male who presents today for Colonoscopy for Positive Cologuard test .  Patient with a positive Cologuard in September 2024.  Attempted colonoscopy January 2025 with an adequate prep.  Large amounts of stool and seeds clogging the scope at that point in time.  Sigmoid diverticulosis and internal hemorrhoids were noted.  3 adenomatous polyps and 1 sessile serrated polyp were removed including one greater than 10 mm.  Performing colonoscopy today for better evaluation with improved prep.  Previous colonoscopy in [place] was reportedly normal per patient  Deals with chronic constipation with a bowel movement every 2 to 3 days.  Hemoglobin 16 MCV 89 platelets 185,000. No family history of colon cancer or colon polyps  Xarelto held for the procedure (last dose Monday)  Past Medical History:  Diagnosis Date   Allergy    cats and pollen   Arthritis    Asthma    Atrial fibrillation (HCC)    Atrial septal defect    Atrial septal defect    Back pain    Cellulitis    Dyspnea    Gout    Hematuria    History of chicken pox    History of kidney stones    Hyperglycemia    Hyperlipidemia    Hypertension    Impaired fasting glucose    Motion sickness    Primary osteoarthritis of left knee    Pulmonary hypertension (HCC)    Sleep apnea    SOB (shortness of breath) on exertion    Venous insufficiency     Past Surgical History:  Procedure Laterality Date   APPENDECTOMY     CARDIAC CATHETERIZATION     CATARACT EXTRACTION W/PHACO Right 12/26/2021   Procedure: CATARACT EXTRACTION PHACO AND INTRAOCULAR LENS PLACEMENT (IOC) RIGHT;  Surgeon: Lockie Mola, MD;  Location: [facility];  Service: Ophthalmology;  Laterality:  Right;  4.81 00:44.4   COLONOSCOPY WITH PROPOFOL N/A 07/10/2023   Procedure: COLONOSCOPY WITH PROPOFOL;  Surgeon: [name], DO;  Location: [facility] ENDOSCOPY;  Service: Gastroenterology;  Laterality: N/A;   ENDOVENOUS ABLATION SAPHENOUS VEIN W/ LASER Right    EYE SURGERY     LEG SKIN LESION  BIOPSY / EXCISION Right    POLYPECTOMY  07/10/2023   Procedure: POLYPECTOMY;  Surgeon: [name], DO;  Location: [facility] ENDOSCOPY;  Service: Gastroenterology;;    Family History No h/o GI disease or malignancy  Review of Systems  Constitutional:  Negative for activity change, appetite change, chills, diaphoresis, fatigue, fever and unexpected weight change.  HENT:  Negative for trouble swallowing and voice change.   Respiratory:  Negative for shortness of breath and wheezing.   Cardiovascular:  Negative for chest pain, palpitations and leg swelling.  Gastrointestinal:  Negative for abdominal distention, abdominal pain, anal bleeding, blood in stool, constipation, diarrhea, nausea and vomiting.  Musculoskeletal:  Negative for arthralgias and myalgias.  Skin:  Negative for color change and pallor.  Neurological:  Negative for dizziness, syncope and weakness.  Psychiatric/Behavioral:  Negative for confusion. The patient is not nervous/anxious.   All other systems reviewed and are negative.    Medications No current facility-administered medications on file prior to encounter.   Current Outpatient Medications on File Prior to Encounter  Medication  Sig Dispense Refill   carvedilol (COREG) 25 MG tablet TAKE 1 TABLET(25 MG) BY MOUTH TWICE DAILY 180 tablet 3   lisinopril (ZESTRIL) 40 MG tablet TAKE 1 TABLET(40 MG) BY MOUTH DAILY 90 tablet 3   allopurinol (ZYLOPRIM) 100 MG tablet Take 1 tablet (100 mg total) by mouth daily. 90 tablet 3   brimonidine (ALPHAGAN) 0.2 % ophthalmic solution Place 1 drop into the right eye 2 (two) times daily.     docusate sodium (STOOL SOFTENER) 100 MG  capsule Take 100 mg by mouth daily.     finasteride (PROSCAR) 5 MG tablet Take 1 tablet (5 mg total) by mouth daily. 90 tablet 3   glucosamine-chondroitin 500-400 MG tablet Take 1 tablet by mouth 3 (three) times daily.     lovastatin (MEVACOR) 20 MG tablet TAKE 1 TABLET(20 MG) BY MOUTH AT BEDTIME 90 tablet 1   Multiple Vitamin (MULTIVITAMIN) tablet Take 1 tablet by mouth daily.     Omega-3 Fatty Acids (FISH OIL) 1000 MG CPDR Take by mouth.     rivaroxaban (XARELTO) 20 MG TABS tablet Take 1 tablet (20 mg total) by mouth daily with supper. 30 tablet 11    Pertinent medications related to GI and procedure were reviewed by me with the patient prior to the procedure   Current Facility-Administered Medications:    0.9 %  sodium chloride infusion, , Intravenous, Continuous, Butch, Otterson, DO, Last Rate: 20 mL/hr at 09/11/23 1311, Continued from Pre-op at 09/11/23 1311  sodium chloride 20 mL/hr at 09/11/23 1311       No Known Allergies Allergies were reviewed by me prior to the procedure  Objective   Body mass index is 38.31 kg/m. Vitals:   09/11/23 1238  BP: (!) 141/85  Pulse: 78  Resp: 20  Temp: (!) 96.4 F (35.8 C)  TempSrc: Temporal  SpO2: 100%  Weight: 131.7 kg  Height: 6\' 1"  (1.854 m)     Physical Exam Vitals and nursing note reviewed.  Constitutional:      General: He is not in acute distress.    Appearance: Normal appearance. He is obese. He is not ill-appearing, toxic-appearing or diaphoretic.  HENT:     Head: Normocephalic and atraumatic.     Nose: Nose normal.     Mouth/Throat:     Mouth: Mucous membranes are moist.     Pharynx: Oropharynx is clear.  Eyes:     General: No scleral icterus.    Extraocular Movements: Extraocular movements intact.  Cardiovascular:     Rate and Rhythm: Normal rate and regular rhythm.     Heart sounds: Normal heart sounds. No murmur heard.    No friction rub. No gallop.  Pulmonary:     Effort: Pulmonary effort is normal.  No respiratory distress.     Breath sounds: Normal breath sounds. No wheezing, rhonchi or rales.  Abdominal:     General: Bowel sounds are normal. There is no distension.     Palpations: Abdomen is soft.     Tenderness: There is no abdominal tenderness. There is no guarding or rebound.  Musculoskeletal:     Cervical back: Neck supple.     Right lower leg: No edema.     Left lower leg: No edema.  Skin:    General: Skin is warm and dry.     Coloration: Skin is not jaundiced or pale.  Neurological:     General: No focal deficit present.     Mental Status: He is alert  and oriented to person, place, and time. Mental status is at baseline.  Psychiatric:        Mood and Affect: Mood normal.        Behavior: Behavior normal.        Thought Content: Thought content normal.        Judgment: Judgment normal.      Assessment:  Michael Blevins is a 73 y.o. male  who presents today for Colonoscopy for Positive Cologuard test .  Plan:  Colonoscopy with possible intervention today  Colonoscopy with possible biopsy, control of bleeding, polypectomy, and interventions as necessary has been discussed with the patient/patient representative. Informed consent was obtained from the patient/patient representative after explaining the indication, nature, and risks of the procedure including but not limited to death, bleeding, perforation, missed neoplasm/lesions, cardiorespiratory compromise, and reaction to medications. Opportunity for questions was given and appropriate answers were provided. Patient/patient representative has verbalized understanding is amenable to undergoing the procedure.   [name], DO  [facility]  Portions of the record may have been created with voice recognition software. Occasional wrong-word or 'sound-a-like' substitutions may have occurred due to the inherent limitations of voice recognition software.  Read the chart carefully and  recognize, using context, where substitutions may have occurred.

## 2023-09-11 NOTE — Anesthesia Preprocedure Evaluation (Addendum)
 Anesthesia Evaluation  Patient identified by MRN, date of birth, ID band Patient awake    Reviewed: Allergy & Precautions, H&P , NPO status , Patient's Chart, lab work & pertinent test results  Airway Mallampati: III  TM Distance: >3 FB Neck ROM: full    Dental no notable dental hx.    Pulmonary asthma , sleep apnea , former smoker   Pulmonary exam normal        Cardiovascular Exercise Tolerance: Good hypertension, pulmonary hypertensionNormal cardiovascular exam+ dysrhythmias Atrial Fibrillation      Neuro/Psych negative neurological ROS  negative psych ROS   GI/Hepatic negative GI ROS, Neg liver ROS,,,  Endo/Other  negative endocrine ROS    Renal/GU negative Renal ROS  negative genitourinary   Musculoskeletal   Abdominal  (+) + obese  Peds  Hematology negative hematology ROS (+)   Anesthesia Other Findings Past Medical History: No date: Allergy     Comment:  cats and pollen No date: Arthritis No date: Asthma No date: Atrial fibrillation (HCC) No date: Atrial septal defect No date: Atrial septal defect No date: Back pain No date: Cellulitis No date: Dyspnea No date: Gout No date: Hematuria No date: History of chicken pox No date: History of kidney stones No date: Hyperglycemia No date: Hyperlipidemia No date: Hypertension No date: Impaired fasting glucose No date: Motion sickness No date: Primary osteoarthritis of left knee No date: Pulmonary hypertension (HCC) No date: Sleep apnea No date: SOB (shortness of breath) on exertion No date: Venous insufficiency  Past Surgical History: No date: APPENDECTOMY No date: CARDIAC CATHETERIZATION 12/26/2021: CATARACT EXTRACTION W/PHACO; Right     Comment:  Procedure: CATARACT EXTRACTION PHACO AND INTRAOCULAR               LENS PLACEMENT (IOC) RIGHT;  Surgeon: Lockie Mola, MD;  Location: [facility];  Service:                Ophthalmology;  Laterality: Right;  4.81 00:44.4 07/10/2023: COLONOSCOPY WITH PROPOFOL; N/A     Comment:  Procedure: COLONOSCOPY WITH PROPOFOL;  Surgeon: [name], DO;  Location: [facility] ENDOSCOPY;  Service:               Gastroenterology;  Laterality: N/A; No date: ENDOVENOUS ABLATION SAPHENOUS VEIN W/ LASER; Right No date: EYE SURGERY No date: LEG SKIN LESION  BIOPSY / EXCISION; Right 07/10/2023: POLYPECTOMY     Comment:  Procedure: POLYPECTOMY;  Surgeon: [name],              DO;  Location: [facility] ENDOSCOPY;  Service:               Gastroenterology;;     Reproductive/Obstetrics negative OB ROS                             Anesthesia Physical Anesthesia Plan  ASA: 3  Anesthesia Plan: General   Post-op Pain Management: Minimal or no pain anticipated   Induction: Intravenous  PONV Risk Score and Plan: Propofol infusion and TIVA  Airway Management Planned: Natural Airway  Additional Equipment:   Intra-op Plan:   Post-operative Plan:   Informed Consent: I have reviewed the patients History and Physical, chart, labs and discussed the procedure including the risks, benefits and alternatives for the  proposed anesthesia with the patient or authorized representative who has indicated his/her understanding and acceptance.     Dental Advisory Given  Plan Discussed with: CRNA and Surgeon  Anesthesia Plan Comments:         Anesthesia Quick Evaluation

## 2023-09-11 NOTE — Op Note (Addendum)
 [facility] Patient Name: Michael Blevins Procedure Date: 09/11/2023 1:18 PM MRN: [id number] Account #: [id number] Date of Birth: 11-Jul-1950 Admit Type: Outpatient Age: 73 Room: [facility] ENDO ROOM 1 Gender: Male Note Status: Finalized Instrument Name: Colonoscope [id number] Procedure:             Colonoscopy Indications:           Positive Cologuard test Providers:             [name] DO, DO Referring MD:          Dale Smith, MD (Referring MD) Medicines:             Monitored Anesthesia Care Complications:         No immediate complications. Estimated blood loss:                         Minimal. Procedure:             Pre-Anesthesia Assessment:                        - Prior to the procedure, a History and Physical was                         performed, and patient medications and allergies were                         reviewed. The patient is competent. The risks and                         benefits of the procedure and the sedation options and                         risks were discussed with the patient. All questions                         were answered and informed consent was obtained.                         Patient identification and proposed procedure were                         verified by the physician, the nurse, the anesthetist                         and the technician in the endoscopy suite. Mental                         Status Examination: alert and oriented. Airway                         Examination: normal oropharyngeal airway and neck                         mobility. Respiratory Examination: clear to                         auscultation. CV Examination: irregularly irregular  rate and rhythm. Prophylactic Antibiotics: The patient                         does not require prophylactic antibiotics. Prior                         Anticoagulants: The patient has taken Xarelto                          (rivaroxaban), last dose was 3 days prior to                         procedure. ASA Grade Assessment: III - A patient with                         severe systemic disease. After reviewing the risks and                         benefits, the patient was deemed in satisfactory                         condition to undergo the procedure. The anesthesia                         plan was to use monitored anesthesia care (MAC).                         Immediately prior to administration of medications,                         the patient was re-assessed for adequacy to receive                         sedatives. The heart rate, respiratory rate, oxygen                         saturations, blood pressure, adequacy of pulmonary                         ventilation, and response to care were monitored                         throughout the procedure. The physical status of the                         patient was re-assessed after the procedure.                        After obtaining informed consent, the colonoscope was                         passed under direct vision. Throughout the procedure,                         the patient's blood pressure, pulse, and oxygen                         saturations were monitored continuously. The  Colonoscope was introduced through the anus and                         advanced to the the cecum, identified by appendiceal                         orifice and ileocecal valve. The colonoscopy was                         performed without difficulty. The patient tolerated                         the procedure well. The quality of the bowel                         preparation was evaluated using the BBPS [facility] Bowel                         Preparation Scale) with scores of: Right Colon = 2                         (minor amount of residual staining, small fragments of                         stool and/or opaque liquid, but mucosa seen well),                          Transverse Colon = 3 (entire mucosa seen well with no                         residual staining, small fragments of stool or opaque                         liquid) and Left Colon = 3 (entire mucosa seen well                         with no residual staining, small fragments of stool or                         opaque liquid). The total BBPS score equals 8. The                         quality of the bowel preparation was excellent. The                         ileocecal valve, appendiceal orifice, and rectum were                         photographed. Findings:      The perianal and digital rectal examinations were normal. Pertinent       negatives include normal sphincter tone.      Multiple small-mouthed diverticula were found in the sigmoid colon.       Estimated blood loss: none.      Non-bleeding internal hemorrhoids were found during retroflexion. The       hemorrhoids were Grade I (internal hemorrhoids that do not prolapse).  Estimated blood loss: none.      Three sessile polyps were found in the descending colon and ascending       colon. The polyps were 1 to 2 mm in size. These polyps were removed with       a jumbo cold forceps. Resection and retrieval were complete. Estimated       blood loss was minimal.      Two sessile polyps were found in the ascending colon. The polyps were 3       to 8 mm in size. These polyps were removed with a cold snare. Resection       and retrieval were complete. Estimated blood loss was minimal.      The exam was otherwise without abnormality on direct and retroflexion       views. Impression:            - Diverticulosis in the sigmoid colon.                        - Non-bleeding internal hemorrhoids.                        - Three 1 to 2 mm polyps in the descending colon and                         in the ascending colon, removed with a jumbo cold                         forceps. Resected and retrieved.                         - Two 3 to 8 mm polyps in the ascending colon, removed                         with a cold snare. Resected and retrieved.                        - The examination was otherwise normal on direct and                         retroflexion views. Recommendation:        - Patient has a contact number available for                         emergencies. The signs and symptoms of potential                         delayed complications were discussed with the patient.                         Return to normal activities tomorrow. Written                         discharge instructions were provided to the patient.                        - Discharge patient to home.                        - Resume  previous diet.                        - Continue present medications.                        - Resume Xarelto (rivaroxaban) at prior dose in 2                         days. Refer to managing physician for further                         adjustment of therapy.                        - Await pathology results.                        - Repeat colonoscopy for surveillance based on                         pathology results.                        - Return to referring physician as previously                         scheduled.                        - The findings and recommendations were discussed with                         the patient. Procedure Code(s):     --- Professional ---                        [phone], Colonoscopy, flexible; with removal of                         tumor(s), polyp(s), or other lesion(s) by snare                         technique                        45380, 59, Colonoscopy, flexible; with biopsy, single                         or multiple Diagnosis Code(s):     --- Professional ---                        K64.0, First degree hemorrhoids                        D12.4, Benign neoplasm of descending colon                        D12.2, Benign neoplasm of ascending colon                         R19.5, Other fecal abnormalities  K57.30, Diverticulosis of large intestine without                         perforation or abscess without bleeding CPT copyright 2022 American Medical Association. All rights reserved. The codes documented in this report are preliminary and upon coder review may  be revised to meet current compliance requirements. Attending Participation:      I personally performed the entire procedure. Elfredia Nevins, DO [name] DO, DO 09/11/2023 2:06:50 PM This report has been signed electronically. Number of Addenda: 0 Note Initiated On: 09/11/2023 1:18 PM Scope Withdrawal Time: 0 hours 22 minutes 44 seconds  Total Procedure Duration: 0 hours 28 minutes 0 seconds  Estimated Blood Loss:  Estimated blood loss was minimal.      [facility]

## 2023-09-11 NOTE — Interval H&P Note (Signed)
 History and Physical Interval Note: Preprocedure H&P from 09/11/23  was reviewed and there was no interval change after seeing and examining the patient.  Written consent was obtained from the patient after discussion of risks, benefits, and alternatives. Patient has consented to proceed with Colonoscopy with possible intervention    09/11/2023 1:25 PM  Michael Blevins  has presented today for surgery, with the diagnosis of Positive colorectal cancer screening using Cologuard test (R19.5) Long term (current) use of anticoagulants (Z79.01).  The various methods of treatment have been discussed with the patient and family. After consideration of risks, benefits and other options for treatment, the patient has consented to  Procedure(s): COLONOSCOPY (N/A) as a surgical intervention.  The patient's history has been reviewed, patient examined, no change in status, stable for surgery.  I have reviewed the patient's chart and labs.  Questions were answered to the patient's satisfaction.     [name]

## 2023-09-11 NOTE — Anesthesia Postprocedure Evaluation (Signed)
 Anesthesia Post Note  Patient: [name]  Procedure(s) Performed: COLONOSCOPY POLYPECTOMY  Patient location during evaluation: PACU Anesthesia Type: General Level of consciousness: awake and alert Pain management: pain level controlled Vital Signs Assessment: post-procedure vital signs reviewed and stable Respiratory status: spontaneous breathing, nonlabored ventilation and respiratory function stable Cardiovascular status: blood pressure returned to baseline and stable Postop Assessment: no apparent nausea or vomiting Anesthetic complications: no   No notable events documented.   Last Vitals:  Vitals:   09/11/23 1437 09/11/23 1446  BP: 124/76 135/82  Pulse: 64 62  Resp: 15 16  Temp:    SpO2: 96% 99%    Last Pain:  Vitals:   09/11/23 1446  TempSrc:   PainSc: 0-No pain                 Michael Blevins

## 2023-09-11 NOTE — Transfer of Care (Signed)
 Immediate Anesthesia Transfer of Care Note  Patient: Michael Blevins  Procedure(s) Performed: COLONOSCOPY POLYPECTOMY  Patient Location: Endoscopy Unit  Anesthesia Type:General  Level of Consciousness: drowsy and patient cooperative  Airway & Oxygen Therapy: Patient Spontanous Breathing and Patient connected to face mask oxygen  Post-op Assessment: Report given to RN and Post -op Vital signs reviewed and stable  Post vital signs: Reviewed and stable  Last Vitals:  Vitals Value Taken Time  BP 108/62 09/11/23 1405  Temp 35.7 C 09/11/23 1405  Pulse 81 09/11/23 1405  Resp 25 09/11/23 1405  SpO2 100 % 09/11/23 1405    Last Pain:  Vitals:   09/11/23 1405  TempSrc: Temporal  PainSc: Asleep         Complications: No notable events documented.

## 2023-09-12 ENCOUNTER — Encounter: Payer: Self-pay | Admitting: Gastroenterology

## 2023-09-12 LAB — SURGICAL PATHOLOGY

## 2023-09-16 DIAGNOSIS — H2512 Age-related nuclear cataract, left eye: Secondary | ICD-10-CM | POA: Diagnosis not present

## 2023-09-16 DIAGNOSIS — H401112 Primary open-angle glaucoma, right eye, moderate stage: Secondary | ICD-10-CM | POA: Diagnosis not present

## 2023-09-16 DIAGNOSIS — H40002 Preglaucoma, unspecified, left eye: Secondary | ICD-10-CM | POA: Diagnosis not present

## 2023-09-16 DIAGNOSIS — H1013 Acute atopic conjunctivitis, bilateral: Secondary | ICD-10-CM | POA: Diagnosis not present

## 2023-09-22 DIAGNOSIS — Z03818 Encounter for observation for suspected exposure to other biological agents ruled out: Secondary | ICD-10-CM | POA: Diagnosis not present

## 2023-09-22 DIAGNOSIS — J069 Acute upper respiratory infection, unspecified: Secondary | ICD-10-CM | POA: Diagnosis not present

## 2023-10-15 ENCOUNTER — Encounter (HOSPITAL_COMMUNITY): Payer: Self-pay

## 2023-10-20 ENCOUNTER — Other Ambulatory Visit (INDEPENDENT_AMBULATORY_CARE_PROVIDER_SITE_OTHER): Payer: Medicare Other

## 2023-10-20 DIAGNOSIS — E7800 Pure hypercholesterolemia, unspecified: Secondary | ICD-10-CM | POA: Diagnosis not present

## 2023-10-20 DIAGNOSIS — R739 Hyperglycemia, unspecified: Secondary | ICD-10-CM | POA: Diagnosis not present

## 2023-10-20 DIAGNOSIS — I10 Essential (primary) hypertension: Secondary | ICD-10-CM

## 2023-10-20 LAB — CBC WITH DIFFERENTIAL/PLATELET
Basophils Absolute: 0.1 10*3/uL (ref 0.0–0.1)
Basophils Relative: 0.9 % (ref 0.0–3.0)
Eosinophils Absolute: 0.3 10*3/uL (ref 0.0–0.7)
Eosinophils Relative: 3.3 % (ref 0.0–5.0)
HCT: 44.8 % (ref 39.0–52.0)
Hemoglobin: 15 g/dL (ref 13.0–17.0)
Lymphocytes Relative: 22 % (ref 12.0–46.0)
Lymphs Abs: 1.9 10*3/uL (ref 0.7–4.0)
MCHC: 33.5 g/dL (ref 30.0–36.0)
MCV: 91.2 fl (ref 78.0–100.0)
Monocytes Absolute: 0.8 10*3/uL (ref 0.1–1.0)
Monocytes Relative: 9.6 % (ref 3.0–12.0)
Neutro Abs: 5.4 10*3/uL (ref 1.4–7.7)
Neutrophils Relative %: 64.2 % (ref 43.0–77.0)
Platelets: 156 10*3/uL (ref 150.0–400.0)
RBC: 4.91 Mil/uL (ref 4.22–5.81)
RDW: 13.6 % (ref 11.5–15.5)
WBC: 8.4 10*3/uL (ref 4.0–10.5)

## 2023-10-20 LAB — HEPATIC FUNCTION PANEL
ALT: 20 U/L (ref 0–53)
AST: 21 U/L (ref 0–37)
Albumin: 4 g/dL (ref 3.5–5.2)
Alkaline Phosphatase: 53 U/L (ref 39–117)
Bilirubin, Direct: 0.4 mg/dL — ABNORMAL HIGH (ref 0.0–0.3)
Total Bilirubin: 1.4 mg/dL — ABNORMAL HIGH (ref 0.2–1.2)
Total Protein: 6.4 g/dL (ref 6.0–8.3)

## 2023-10-20 LAB — BASIC METABOLIC PANEL WITH GFR
BUN: 16 mg/dL (ref 6–23)
CO2: 28 meq/L (ref 19–32)
Calcium: 8.8 mg/dL (ref 8.4–10.5)
Chloride: 107 meq/L (ref 96–112)
Creatinine, Ser: 0.93 mg/dL (ref 0.40–1.50)
GFR: 81.47 mL/min (ref >60.00)
Glucose, Bld: 104 mg/dL — ABNORMAL HIGH (ref 70–99)
Potassium: 3.9 meq/L (ref 3.5–5.1)
Sodium: 143 meq/L (ref 135–145)

## 2023-10-20 LAB — LIPID PANEL
Cholesterol: 102 mg/dL (ref 0–200)
HDL: 34 mg/dL — ABNORMAL LOW (ref >39.00)
LDL Cholesterol: 55 mg/dL (ref 0–99)
NonHDL: 68.06
Total CHOL/HDL Ratio: 3
Triglycerides: 66 mg/dL (ref 0.0–149.0)
VLDL: 13.2 mg/dL (ref 0.0–40.0)

## 2023-10-20 LAB — HEMOGLOBIN A1C: Hgb A1c MFr Bld: 5.5 % (ref 4.6–6.5)

## 2023-10-22 ENCOUNTER — Ambulatory Visit (INDEPENDENT_AMBULATORY_CARE_PROVIDER_SITE_OTHER): Admitting: Internal Medicine

## 2023-10-22 ENCOUNTER — Encounter: Payer: Self-pay | Admitting: Internal Medicine

## 2023-10-22 ENCOUNTER — Encounter: Payer: Medicare Other | Admitting: Internal Medicine

## 2023-10-22 VITALS — BP 126/74 | HR 72 | Temp 98.0°F | Resp 16 | Ht 73.0 in | Wt 299.8 lb

## 2023-10-22 DIAGNOSIS — M25552 Pain in left hip: Secondary | ICD-10-CM

## 2023-10-22 DIAGNOSIS — E7800 Pure hypercholesterolemia, unspecified: Secondary | ICD-10-CM | POA: Diagnosis not present

## 2023-10-22 DIAGNOSIS — R739 Hyperglycemia, unspecified: Secondary | ICD-10-CM

## 2023-10-22 DIAGNOSIS — I4891 Unspecified atrial fibrillation: Secondary | ICD-10-CM

## 2023-10-22 DIAGNOSIS — R7989 Other specified abnormal findings of blood chemistry: Secondary | ICD-10-CM | POA: Insufficient documentation

## 2023-10-22 DIAGNOSIS — G4730 Sleep apnea, unspecified: Secondary | ICD-10-CM

## 2023-10-22 DIAGNOSIS — I10 Essential (primary) hypertension: Secondary | ICD-10-CM

## 2023-10-22 DIAGNOSIS — I872 Venous insufficiency (chronic) (peripheral): Secondary | ICD-10-CM | POA: Diagnosis not present

## 2023-10-22 DIAGNOSIS — D6869 Other thrombophilia: Secondary | ICD-10-CM | POA: Diagnosis not present

## 2023-10-22 DIAGNOSIS — I272 Pulmonary hypertension, unspecified: Secondary | ICD-10-CM | POA: Diagnosis not present

## 2023-10-22 DIAGNOSIS — Z0000 Encounter for general adult medical examination without abnormal findings: Secondary | ICD-10-CM

## 2023-10-22 MED ORDER — LOVASTATIN 20 MG PO TABS: 20.0000 mg | ORAL_TABLET | Freq: Every day | ORAL | 1 refills | Status: DC

## 2023-10-22 MED ORDER — LISINOPRIL 40 MG PO TABS: 40.0000 mg | ORAL_TABLET | Freq: Every day | ORAL | 3 refills | Status: AC

## 2023-10-22 NOTE — Assessment & Plan Note (Addendum)
 Physical today 10/22/23.   PSA 09/2021 -  .47.  Overdue. Schedule with next non fasting lab. 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

## 2023-10-22 NOTE — Progress Notes (Signed)
 Subjective:    Patient ID: Michael Blevins, male    DOB: February 15, 1951, 73 y.o.   MRN: [id number]  Patient here for  Chief Complaint  Patient presents with   Annual Exam    HPI Here for a physical exam. S/p colonoscopy 09/11/23 - diverticulosis, non bleeding internal hemorrhoids, three 1-79mm polyps in the descending colon and in the ascending colon and two 3-8 mm polyps in the ascending colon. Continues on xarelto  and carvedilol  - afib. Using cpap. Overall feels from a cardiac standpoint - stable. No chest pain or sob reported. No abdominal pain or bowel change reported. Increased stress - recent passing of his son. Does not feel needs any further intervention at this time. Is having left hip pain. No known recent injury.    Past Medical History:  Diagnosis Date   Allergy    cats and pollen   Arthritis    Asthma    Atrial fibrillation (HCC)    Atrial septal defect    Atrial septal defect    Back pain    Cellulitis    Dyspnea    Gout    Hematuria    History of chicken pox    History of kidney stones    Hyperglycemia    Hyperlipidemia    Hypertension    Impaired fasting glucose    Motion sickness    Primary osteoarthritis of left knee    Pulmonary hypertension (HCC)    Sleep apnea    SOB (shortness of breath) on exertion    Venous insufficiency    Past Surgical History:  Procedure Laterality Date   APPENDECTOMY     CARDIAC CATHETERIZATION     CATARACT EXTRACTION W/PHACO Right 12/26/2021   Procedure: CATARACT EXTRACTION PHACO AND INTRAOCULAR LENS PLACEMENT (IOC) RIGHT;  Surgeon: Annell Kidney, MD;  [facility];  Service: Ophthalmology;  Laterality: Right;  4.81 00:44.4   COLONOSCOPY N/A 09/11/2023   Procedure: COLONOSCOPY;  Surgeon: Quintin Buckle, DO;  Location: [facility] ENDOSCOPY;  Service: Gastroenterology;  Laterality: N/A;   COLONOSCOPY WITH PROPOFOL  N/A 07/10/2023   Procedure: COLONOSCOPY WITH PROPOFOL ;  Surgeon: Quintin Buckle, DO;   Location: [facility] ENDOSCOPY;  Service: Gastroenterology;  Laterality: N/A;   ENDOVENOUS ABLATION SAPHENOUS VEIN W/ LASER Right    EYE SURGERY     LEG SKIN LESION  BIOPSY / EXCISION Right    POLYPECTOMY  07/10/2023   Procedure: POLYPECTOMY;  Surgeon: Quintin Buckle, DO;  Location: [facility] ENDOSCOPY;  Service: Gastroenterology;;   POLYPECTOMY  09/11/2023   Procedure: POLYPECTOMY;  Surgeon: Quintin Buckle, DO;  Location: [facility] ENDOSCOPY;  Service: Gastroenterology;;   Family History  Problem Relation Age of Onset   Hypertension Mother    Diabetes Father    Kidney disease Neg Hx    Prostate cancer Neg Hx    Social History   Socioeconomic History   Marital status: Married    Spouse name: Not on file   Number of children: Not on file   Years of education: Not on file   Highest education level: Not on file  Occupational History   Occupation: retired  Tobacco Use   Smoking status: Former    Types: Cigarettes   Smokeless tobacco: Former   Tobacco comments:    quit 33 years  Vaping Use   Vaping status: Never Used  Substance and Sexual Activity   Alcohol use: No    Alcohol/week: 0.0 standard drinks of alcohol   Drug use: No  Sexual activity: Not on file  Other Topics Concern   Not on file  Social History Narrative   Not on file   Social Drivers of Health   Financial Resource Strain: Low Risk  (04/24/2023)   Received from [facility]   Overall Financial Resource Strain (CARDIA)    Difficulty of Paying Living Expenses: Not very hard  Food Insecurity: No Food Insecurity (04/24/2023)   Received from [facility]   Hunger Vital Sign    Worried About Running Out of Food in the Last Year: Never true    Ran Out of Food in the Last Year: Never true  Transportation Needs: No Transportation Needs (04/24/2023)   Received from [facility] - Transportation    In the past 12 months, has lack of transportation kept  you from medical appointments or from getting medications?: No    Lack of Transportation (Non-Medical): No  Physical Activity: Not on file  Stress: Not on file  Social Connections: Not on file     Review of Systems  Constitutional:  Negative for appetite change and unexpected weight change.  HENT:  Negative for congestion, sinus pressure and sore throat.   Eyes:  Negative for pain and visual disturbance.  Respiratory:  Negative for cough, chest tightness and shortness of breath.   Cardiovascular:  Negative for chest pain and palpitations.       No increased swelling.   Gastrointestinal:  Negative for abdominal pain, diarrhea, nausea and vomiting.  Genitourinary:  Negative for difficulty urinating and dysuria.  Musculoskeletal:  Negative for joint swelling and myalgias.       Left hip pain.   Skin:  Negative for color change and rash.  Neurological:  Negative for dizziness and headaches.  Hematological:  Negative for adenopathy. Does not bruise/bleed easily.  Psychiatric/Behavioral:  Negative for decreased concentration and dysphoric mood.        Objective:     BP 126/74   Pulse 72   Temp 98 F (36.7 C)   Resp 16   Ht 6\' 1"  (1.854 m)   Wt 299 lb 12.8 oz (136 kg)   SpO2 98%   BMI 39.55 kg/m  Wt Readings from Last 3 Encounters:  10/29/23 (!) 302 lb 12.8 oz (137.3 kg)  10/22/23 299 lb 12.8 oz (136 kg)  09/11/23 290 lb 6.4 oz (131.7 kg)    Physical Exam Constitutional:      General: He is not in acute distress.    Appearance: Normal appearance. He is well-developed.  HENT:     Head: Normocephalic and atraumatic.     Right Ear: External ear normal.     Left Ear: External ear normal.     Mouth/Throat:     Pharynx: No oropharyngeal exudate or posterior oropharyngeal erythema.  Eyes:     General: No scleral icterus.       Right eye: No discharge.        Left eye: No discharge.     Conjunctiva/sclera: Conjunctivae normal.  Neck:     Thyroid : No thyromegaly.   Cardiovascular:     Rate and Rhythm: Normal rate and regular rhythm.  Pulmonary:     Effort: No respiratory distress.     Breath sounds: Normal breath sounds. No wheezing.  Abdominal:     General: Bowel sounds are normal.     Palpations: Abdomen is soft.     Tenderness: There is no abdominal tenderness.  Musculoskeletal:  General: No swelling or tenderness.     Cervical back: Neck supple. No tenderness.  Lymphadenopathy:     Cervical: No cervical adenopathy.  Skin:    Findings: No erythema or rash.  Neurological:     Mental Status: He is alert and oriented to person, place, and time.  Psychiatric:        Mood and Affect: Mood normal.        Behavior: Behavior normal.         Outpatient Encounter Medications as of 10/22/2023  Medication Sig   allopurinol  (ZYLOPRIM ) 100 MG tablet Take 1 tablet (100 mg total) by mouth daily.   amLODipine  (NORVASC ) 10 MG tablet Take 1 tablet (10 mg) by mouth daily   brimonidine  (ALPHAGAN ) 0.2 % ophthalmic solution Place 1 drop into the right eye 2 (two) times daily.   carvedilol  (COREG ) 25 MG tablet TAKE 1 TABLET(25 MG) BY MOUTH TWICE DAILY   docusate sodium  (STOOL SOFTENER) 100 MG capsule Take 100 mg by mouth daily.   finasteride  (PROSCAR ) 5 MG tablet Take 1 tablet (5 mg total) by mouth daily.   furosemide  (LASIX ) 40 MG tablet TAKE 1 TABLET(40 MG) BY MOUTH DAILY AS NEEDED   glucosamine-chondroitin 500-400 MG tablet Take 1 tablet by mouth 3 (three) times daily.   lisinopril  (ZESTRIL ) 40 MG tablet Take 1 tablet (40 mg total) by mouth daily.   lovastatin  (MEVACOR ) 20 MG tablet Take 1 tablet (20 mg total) by mouth daily.   Multiple Vitamin (MULTIVITAMIN) tablet Take 1 tablet by mouth daily.   Omega-3 Fatty Acids (FISH OIL) 1000 MG CPDR Take by mouth.   [DISCONTINUED] lisinopril  (ZESTRIL ) 40 MG tablet TAKE 1 TABLET(40 MG) BY MOUTH DAILY   [DISCONTINUED] lovastatin  (MEVACOR ) 20 MG tablet TAKE 1 TABLET(20 MG) BY MOUTH AT BEDTIME    [DISCONTINUED] rivaroxaban  (XARELTO ) 20 MG TABS tablet Take 1 tablet (20 mg total) by mouth daily with supper.   No facility-administered encounter medications on file as of 10/22/2023.     Lab Results  Component Value Date   WBC 8.4 10/20/2023   HGB 15.0 10/20/2023   HCT 44.8 10/20/2023   PLT 156.0 10/20/2023   GLUCOSE 104 (H) 10/20/2023   CHOL 102 10/20/2023   TRIG 66.0 10/20/2023   HDL 34.00 (L) 10/20/2023   LDLDIRECT 77.0 04/06/2018   LDLCALC 55 10/20/2023   ALT 20 10/20/2023   AST 21 10/20/2023   NA 143 10/20/2023   K 3.9 10/20/2023   CL 107 10/20/2023   CREATININE 0.93 10/20/2023   BUN 16 10/20/2023   CO2 28 10/20/2023   TSH 2.55 06/06/2023   PSA 0.47 09/28/2021   INR 3.0 10/24/2021   HGBA1C 5.5 10/20/2023       Assessment & Plan:  Routine general medical examination at a health care facility  Essential hypertension Assessment & Plan: On carvedilol , amlodipine  and lisinopril . Blood pressure as outlined.  Follow met b. No changes in medication today.   Orders: -     Basic metabolic panel with GFR; Future  Hypercholesterolemia Assessment & Plan: On lovastatin .  Low cholesterol diet and exercise.  Follow lipid panel. No changes in medication today.   Lab Results  Component Value Date   CHOL 102 10/20/2023   HDL 34.00 (L) 10/20/2023   LDLCALC 55 10/20/2023   LDLDIRECT 77.0 04/06/2018   TRIG 66.0 10/20/2023   CHOLHDL 3 10/20/2023     Orders: -     Lipid panel; Future -     Hepatic  function panel; Future  Hyperglycemia Assessment & Plan: Low carb diet and exercise. Follow met b and A1c.   Orders: -     Hemoglobin A1c; Future  Health care maintenance Assessment & Plan: Physical today 10/22/23.   PSA 09/2021 -  .47.  Overdue. Schedule with next non fasting lab. 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    Abnormal liver function tests Assessment & Plan: Elevated total and direct bilirubin on recent lab check. Recheck  liver panel in a few weeks to contirm normal.    Acquired thrombophilia (HCC) Assessment & Plan: Afib. On xarelto .    Atrial fibrillation, unspecified type [facility]) Assessment & Plan: Followed by cardiology. On xarelto .  Continue carvedilol .  Rate controlled. Blood pressure as outlined. Stable. No changes today.    Sleep apnea, unspecified type Assessment & Plan: Continue cpap. Uses regularly.    Pulmonary hypertension (HCC) Assessment & Plan: Has been evaluated by cardiology.  Using cpap.     Venous insufficiency Assessment & Plan: Continue compression hose. Overall improved.    Left hip pain Assessment & Plan: Persistent left hip pain. Refer to PT for further evaluation and treatment.   Orders: -     Ambulatory referral to Physical Therapy  Other orders -     Lisinopril ; Take 1 tablet (40 mg total) by mouth daily.  Dispense: 90 tablet; Refill: 3 -     Lovastatin ; Take 1 tablet (20 mg total) by mouth daily.  Dispense: 90 tablet; Refill: 1     Dellar Fenton, MD

## 2023-10-23 ENCOUNTER — Other Ambulatory Visit: Payer: Self-pay

## 2023-10-23 MED ORDER — RIVAROXABAN 20 MG PO TABS: 20.0000 mg | ORAL_TABLET | Freq: Every day | ORAL | 1 refills | Status: DC

## 2023-10-23 NOTE — Telephone Encounter (Signed)
 Prescription refill request for Xarelto  received.  Indication: Afib  Last office visit: 10/21/22 Michael Blevins)  Weight: 136kg Age: 73 Scr: 0.93 (10/20/23)  CrCl: 136.77ml/min  Appropriate dose. Refill sent.

## 2023-10-28 ENCOUNTER — Encounter: Payer: Self-pay | Admitting: Internal Medicine

## 2023-10-29 ENCOUNTER — Encounter: Payer: Self-pay | Admitting: Internal Medicine

## 2023-10-29 ENCOUNTER — Ambulatory Visit (INDEPENDENT_AMBULATORY_CARE_PROVIDER_SITE_OTHER): Admitting: Internal Medicine

## 2023-10-29 ENCOUNTER — Ambulatory Visit (INDEPENDENT_AMBULATORY_CARE_PROVIDER_SITE_OTHER)

## 2023-10-29 VITALS — BP 118/84 | HR 53 | Temp 98.6°F | Ht 73.0 in | Wt 302.8 lb

## 2023-10-29 DIAGNOSIS — R21 Rash and other nonspecific skin eruption: Secondary | ICD-10-CM

## 2023-10-29 DIAGNOSIS — R0602 Shortness of breath: Secondary | ICD-10-CM

## 2023-10-29 DIAGNOSIS — I517 Cardiomegaly: Secondary | ICD-10-CM | POA: Diagnosis not present

## 2023-10-29 DIAGNOSIS — I10 Essential (primary) hypertension: Secondary | ICD-10-CM | POA: Diagnosis not present

## 2023-10-29 MED ORDER — BETAMETHASONE VALERATE 0.1 % EX OINT: 1.0000 | TOPICAL_OINTMENT | Freq: Two times a day (BID) | CUTANEOUS | 0 refills | Status: DC

## 2023-10-29 NOTE — Assessment & Plan Note (Addendum)
-   Patient complained of chest tightness with associated shortness of breath over the last 2 weeks -Patient states that chest tightness begins in the morning shortly after waking up and usually last the day -It has been associated with some shortness of breath -No aggravating or relieving factors noted.  No relation to exertion -No associated diaphoresis or nausea -On exam, lungs are clear to auscultation bilaterally but he does have 2+ bilateral lower extremity pitting edema.  He appears to be in an irregular rhythm -Patient does have a history of shortness of breath secondary to fluid overload.  Echo was done last year which showed normal left ventricular and right ventricular function but indeterminant diastolic parameters -It is possible the patient does have chronic diastolic heart failure and her echo was indeterminant -His weight is up 3 pounds since his visit last week and 12 pounds since April -Suspect he is likely having a chronic diastolic heart failure exacerbation resulting in the symptoms -Will obtain chest x-ray to look for pulm vascular congestion -Given his chest tightness over the last 2 weeks we will obtain an EKG to rule out any acute cardiac etiology -Will increase his Lasix  to 40 mg twice daily for the next few days to see if this improves his symptoms -If patient does have worsening chest tightness or shortness of breath he was instructed to follow-up in the emergency room  Addendum: - EKG reviewed.  Patient is in A-fib at a rate of 69 with low voltage precordial leads similar to his prior EKG last year with no acute ischemic changes noted

## 2023-10-29 NOTE — Progress Notes (Signed)
 Acute Office Visit  Subjective:     Patient ID: Michael Blevins, male    DOB: 1951-04-11, 73 y.o.   MRN: [id number]  Chief Complaint  Patient presents with   Acute Visit    Itchy rash on left side wraps around to left side of back Increase in SOB    HPI Patient is in today for a rash over the left side of the abdomen as well as some chest tightness and shortness of breath over the last 2 weeks.  Patient states that he lost his son approximately 6 weeks ago and was doing okay when he met Dr. Geralyn Knee a week ago.  However, since then he has noted some chest tightness beginning of the morning and lasting throughout the day as well as some associated shortness of breath.  He does note some increased swelling in his legs as well.  Patient states that he developed a rash over the left side of his abdomen a few days ago which has been itchy and a little painful.  He does work outside a lot and has been exposed to poison oak and poison ivy in the past.  He normally uses a wash that helps but this has not improved with that.  Patient's blood pressure is well-controlled today and patient states that he is compliant with his antihypertensive regimen.  Review of Systems  Constitutional: Negative.   HENT: Negative.    Respiratory:  Positive for shortness of breath. Negative for cough and wheezing.   Cardiovascular:  Positive for chest pain and leg swelling. Negative for palpitations.  Gastrointestinal: Negative.  Negative for abdominal pain, nausea and vomiting.  Psychiatric/Behavioral:         Patient does complain of significant grief in the wake of his son's loss        Objective:    BP 118/84   Pulse (!) 53   Temp 98.6 F (37 C)   Ht 6\' 1"  (1.854 m)   Wt (!) 302 lb 12.8 oz (137.3 kg)   SpO2 97%   BMI 39.95 kg/m    Physical Exam Constitutional:      Appearance: Normal appearance.  HENT:     Head: Normocephalic and atraumatic.  Cardiovascular:     Rate and Rhythm: Normal  rate. Rhythm irregular.     Heart sounds: Normal heart sounds.  Pulmonary:     Breath sounds: Normal breath sounds. No wheezing, rhonchi or rales.  Abdominal:     Comments: Erythematous macular rash noted over the left side of his anterior abdominal wall which is nontender to palpation.  No pustules or vesicles noted  Musculoskeletal:        General: Swelling present. No tenderness.     Comments: 2+ bilateral lower extremity pitting edema noted  Neurological:     Mental Status: He is alert and oriented to person, place, and time.  Psychiatric:        Mood and Affect: Mood normal.        Behavior: Behavior normal.     No results found for any visits on 10/29/23.      Assessment & Plan:   Problem List Items Addressed This Visit       Cardiovascular and Mediastinum   Essential hypertension   - This problem is chronic and stable -Patient's blood pressure today is 118/84 -He is compliant with lisinopril  40 mg daily, amlodipine  10 mg daily as well as carvedilol  25 mg twice daily  Relevant Orders   EKG 12-Lead (Completed)     Musculoskeletal and Integument   Rash   - Patient complains of a rash over his anterior abdominal wall on the left side for the last few days -It is pruritic and slightly painful -On exam, macular erythematous rash noted over the left side of his anterior abdominal wall.  No pustules or vesicles noted -Suspect this likely poison ivy versus an allergic dermatitis -Will prescribe betamethasone to see if this will improve his symptoms -No further workup at this time      Relevant Medications   betamethasone valerate ointment (VALISONE) 0.1 %     Other   SOB (shortness of breath) - Primary   - Patient complained of chest tightness with associated shortness of breath over the last 2 weeks -Patient states that chest tightness begins in the morning shortly after waking up and usually last the day -It has been associated with some shortness of  breath -No aggravating or relieving factors noted.  No relation to exertion -No associated diaphoresis or nausea -On exam, lungs are clear to auscultation bilaterally but he does have 2+ bilateral lower extremity pitting edema.  He appears to be in an irregular rhythm -Patient does have a history of shortness of breath secondary to fluid overload.  Echo was done last year which showed normal left ventricular and right ventricular function but indeterminant diastolic parameters -It is possible the patient does have chronic diastolic heart failure and her echo was indeterminant -His weight is up 3 pounds since his visit last week and 12 pounds since April -Suspect he is likely having a chronic diastolic heart failure exacerbation resulting in the symptoms -Will obtain chest x-ray to look for pulm vascular congestion -Given his chest tightness over the last 2 weeks we will obtain an EKG to rule out any acute cardiac etiology -Will increase his Lasix  to 40 mg twice daily for the next few days to see if this improves his symptoms -If patient does have worsening chest tightness or shortness of breath he was instructed to follow-up in the emergency room  Addendum: - EKG reviewed.  Patient is in A-fib at a rate of 69 with low voltage precordial leads similar to his prior EKG last year with no acute ischemic changes noted       Relevant Orders   DG Chest 2 View   EKG 12-Lead (Completed)    Meds ordered this encounter  Medications   betamethasone valerate ointment (VALISONE) 0.1 %    Sig: Apply 1 Application topically 2 (two) times daily.    Dispense:  30 g    Refill:  0    No follow-ups on file.  Avital Dancy, MD

## 2023-10-29 NOTE — Assessment & Plan Note (Signed)
-   Patient complains of a rash over his anterior abdominal wall on the left side for the last few days -It is pruritic and slightly painful -On exam, macular erythematous rash noted over the left side of his anterior abdominal wall.  No pustules or vesicles noted -Suspect this likely poison ivy versus an allergic dermatitis -Will prescribe betamethasone to see if this will improve his symptoms -No further workup at this time

## 2023-10-29 NOTE — Patient Instructions (Addendum)
-   It was a pleasure meeting you today -Your EKG does not show any evidence of acute injury to your heart -I suspect that the reason for the chest tightness and shortness of breath may be fluid overload given the swelling in your legs -I recommend increasing your Lasix  to 40 mg twice daily for the next 3 days to see if this will help improve your symptoms -We will obtain chest x-ray for further evaluation today -If your chest pain does not improve or worsens acutely please follow-up with PCP for further evaluation -I have prescribed betamethasone for the rash on your abdomen which I suspect is likely poison ivy or poison oak or an allergic contact dermatitis -Your blood pressure is well-controlled.  Continue with your current medication regimen for now -Please contact us  with any questions or concerns

## 2023-10-29 NOTE — Telephone Encounter (Signed)
 Pt scheduled with Dr Heide Spark

## 2023-10-29 NOTE — Assessment & Plan Note (Signed)
-   This problem is chronic and stable -Patient's blood pressure today is 118/84 -He is compliant with lisinopril  40 mg daily, amlodipine  10 mg daily as well as carvedilol  25 mg twice daily

## 2023-10-31 ENCOUNTER — Ambulatory Visit: Payer: Self-pay

## 2023-10-31 NOTE — Telephone Encounter (Signed)
 Copied from CRM [id number]. Topic: Clinical - Lab/Test Results >> Oct 31, 2023 12:51 PM Laquanda P wrote: Reason for CRM: Pt called, relayed results- pt state he is feeling much better.

## 2023-11-02 ENCOUNTER — Encounter: Payer: Self-pay | Admitting: Internal Medicine

## 2023-11-02 DIAGNOSIS — M25552 Pain in left hip: Secondary | ICD-10-CM | POA: Insufficient documentation

## 2023-11-02 NOTE — Assessment & Plan Note (Signed)
 Elevated total and direct bilirubin on recent lab check. Recheck liver panel in a few weeks to contirm normal.

## 2023-11-02 NOTE — Assessment & Plan Note (Signed)
Continue cpap. Uses regularly.   

## 2023-11-02 NOTE — Assessment & Plan Note (Signed)
 On lovastatin .  Low cholesterol diet and exercise.  Follow lipid panel. No changes in medication today.   Lab Results  Component Value Date   CHOL 102 10/20/2023   HDL 34.00 (L) 10/20/2023   LDLCALC 55 10/20/2023   LDLDIRECT 77.0 04/06/2018   TRIG 66.0 10/20/2023   CHOLHDL 3 10/20/2023

## 2023-11-02 NOTE — Assessment & Plan Note (Signed)
 Afib. On xarelto.

## 2023-11-02 NOTE — Assessment & Plan Note (Signed)
 Continue compression hose. Overall improved.

## 2023-11-02 NOTE — Assessment & Plan Note (Signed)
 Persistent left hip pain. Refer to PT for further evaluation and treatment.

## 2023-11-02 NOTE — Assessment & Plan Note (Signed)
 On carvedilol , amlodipine  and lisinopril . Blood pressure as outlined.  Follow met b. No changes in medication today.

## 2023-11-02 NOTE — Assessment & Plan Note (Signed)
Has been evaluated by cardiology.  Using cpap.

## 2023-11-02 NOTE — Assessment & Plan Note (Signed)
 Followed by cardiology. On xarelto .  Continue carvedilol .  Rate controlled. Blood pressure as outlined. Stable. No changes today.

## 2023-11-02 NOTE — Assessment & Plan Note (Signed)
 Low-carb diet and exercise.  Follow met b and A1c.

## 2023-11-12 ENCOUNTER — Other Ambulatory Visit (INDEPENDENT_AMBULATORY_CARE_PROVIDER_SITE_OTHER)

## 2023-11-12 ENCOUNTER — Other Ambulatory Visit: Payer: Self-pay

## 2023-11-12 DIAGNOSIS — R7989 Other specified abnormal findings of blood chemistry: Secondary | ICD-10-CM | POA: Diagnosis not present

## 2023-11-12 DIAGNOSIS — Z125 Encounter for screening for malignant neoplasm of prostate: Secondary | ICD-10-CM

## 2023-11-13 LAB — HEPATIC FUNCTION PANEL
ALT: 18 U/L (ref 0–53)
AST: 21 U/L (ref 0–37)
Albumin: 4.2 g/dL (ref 3.5–5.2)
Alkaline Phosphatase: 62 U/L (ref 39–117)
Bilirubin, Direct: 0.3 mg/dL (ref 0.0–0.3)
Total Bilirubin: 1.1 mg/dL (ref 0.2–1.2)
Total Protein: 6.3 g/dL (ref 6.0–8.3)

## 2023-11-13 LAB — PSA, MEDICARE: PSA: 0.5 ng/mL (ref 0.10–4.00)

## 2023-11-14 ENCOUNTER — Ambulatory Visit: Payer: Self-pay | Admitting: Internal Medicine

## 2023-12-02 ENCOUNTER — Encounter: Payer: Self-pay | Admitting: Internal Medicine

## 2023-12-02 DIAGNOSIS — M25569 Pain in unspecified knee: Secondary | ICD-10-CM

## 2023-12-02 NOTE — Telephone Encounter (Signed)
 Order placed for PT referral - Michael Blevins at [facility]. He is located inside the [place]. His phone # is [phone] and Fax is [phone].

## 2023-12-02 NOTE — Telephone Encounter (Signed)
 Ok for referral?

## 2023-12-09 DIAGNOSIS — M25559 Pain in unspecified hip: Secondary | ICD-10-CM | POA: Diagnosis not present

## 2023-12-09 DIAGNOSIS — M25569 Pain in unspecified knee: Secondary | ICD-10-CM | POA: Diagnosis not present

## 2023-12-11 DIAGNOSIS — M25569 Pain in unspecified knee: Secondary | ICD-10-CM | POA: Diagnosis not present

## 2023-12-11 DIAGNOSIS — M25559 Pain in unspecified hip: Secondary | ICD-10-CM | POA: Diagnosis not present

## 2023-12-16 DIAGNOSIS — M25569 Pain in unspecified knee: Secondary | ICD-10-CM | POA: Diagnosis not present

## 2023-12-16 DIAGNOSIS — M25559 Pain in unspecified hip: Secondary | ICD-10-CM | POA: Diagnosis not present

## 2023-12-19 DIAGNOSIS — M25569 Pain in unspecified knee: Secondary | ICD-10-CM | POA: Diagnosis not present

## 2023-12-19 DIAGNOSIS — M25559 Pain in unspecified hip: Secondary | ICD-10-CM | POA: Diagnosis not present

## 2023-12-23 DIAGNOSIS — M25569 Pain in unspecified knee: Secondary | ICD-10-CM | POA: Diagnosis not present

## 2023-12-23 DIAGNOSIS — M25559 Pain in unspecified hip: Secondary | ICD-10-CM | POA: Diagnosis not present

## 2023-12-25 DIAGNOSIS — M25569 Pain in unspecified knee: Secondary | ICD-10-CM | POA: Diagnosis not present

## 2023-12-25 DIAGNOSIS — M25559 Pain in unspecified hip: Secondary | ICD-10-CM | POA: Diagnosis not present

## 2024-01-06 DIAGNOSIS — M25559 Pain in unspecified hip: Secondary | ICD-10-CM | POA: Diagnosis not present

## 2024-01-06 DIAGNOSIS — M25569 Pain in unspecified knee: Secondary | ICD-10-CM | POA: Diagnosis not present

## 2024-01-08 DIAGNOSIS — M25559 Pain in unspecified hip: Secondary | ICD-10-CM | POA: Diagnosis not present

## 2024-01-08 DIAGNOSIS — M25569 Pain in unspecified knee: Secondary | ICD-10-CM | POA: Diagnosis not present

## 2024-01-10 DIAGNOSIS — M25559 Pain in unspecified hip: Secondary | ICD-10-CM | POA: Diagnosis not present

## 2024-01-10 DIAGNOSIS — M25569 Pain in unspecified knee: Secondary | ICD-10-CM | POA: Diagnosis not present

## 2024-01-13 DIAGNOSIS — M25559 Pain in unspecified hip: Secondary | ICD-10-CM | POA: Diagnosis not present

## 2024-01-13 DIAGNOSIS — M25569 Pain in unspecified knee: Secondary | ICD-10-CM | POA: Diagnosis not present

## 2024-01-15 DIAGNOSIS — M25569 Pain in unspecified knee: Secondary | ICD-10-CM | POA: Diagnosis not present

## 2024-01-15 DIAGNOSIS — M25559 Pain in unspecified hip: Secondary | ICD-10-CM | POA: Diagnosis not present

## 2024-01-22 DIAGNOSIS — M25569 Pain in unspecified knee: Secondary | ICD-10-CM | POA: Diagnosis not present

## 2024-01-22 DIAGNOSIS — M25559 Pain in unspecified hip: Secondary | ICD-10-CM | POA: Diagnosis not present

## 2024-01-27 DIAGNOSIS — M25569 Pain in unspecified knee: Secondary | ICD-10-CM | POA: Diagnosis not present

## 2024-01-27 DIAGNOSIS — M25559 Pain in unspecified hip: Secondary | ICD-10-CM | POA: Diagnosis not present

## 2024-01-28 ENCOUNTER — Other Ambulatory Visit: Payer: Self-pay | Admitting: Cardiovascular Disease

## 2024-02-06 DIAGNOSIS — M25569 Pain in unspecified knee: Secondary | ICD-10-CM | POA: Diagnosis not present

## 2024-02-06 DIAGNOSIS — M25559 Pain in unspecified hip: Secondary | ICD-10-CM | POA: Diagnosis not present

## 2024-02-13 DIAGNOSIS — M25559 Pain in unspecified hip: Secondary | ICD-10-CM | POA: Diagnosis not present

## 2024-02-13 DIAGNOSIS — M25569 Pain in unspecified knee: Secondary | ICD-10-CM | POA: Diagnosis not present

## 2024-02-14 ENCOUNTER — Emergency Department
Admission: EM | Admit: 2024-02-14 | Discharge: 2024-02-14 | Disposition: A | Discharge status: Home / Self Care | Attending: Emergency Medicine | Admitting: Emergency Medicine

## 2024-02-14 ENCOUNTER — Ambulatory Visit
Admission: EM | Admit: 2024-02-14 | Discharge: 2024-02-14 | Disposition: A | Discharge status: Home / Self Care | Attending: Family Medicine | Admitting: Family Medicine

## 2024-02-14 ENCOUNTER — Other Ambulatory Visit: Payer: Self-pay

## 2024-02-14 ENCOUNTER — Emergency Department

## 2024-02-14 ENCOUNTER — Encounter: Payer: Self-pay | Admitting: Emergency Medicine

## 2024-02-14 DIAGNOSIS — I10 Essential (primary) hypertension: Secondary | ICD-10-CM | POA: Insufficient documentation

## 2024-02-14 DIAGNOSIS — Y9301 Activity, walking, marching and hiking: Secondary | ICD-10-CM | POA: Diagnosis not present

## 2024-02-14 DIAGNOSIS — Z7901 Long term (current) use of anticoagulants: Secondary | ICD-10-CM

## 2024-02-14 DIAGNOSIS — W19XXXA Unspecified fall, initial encounter: Secondary | ICD-10-CM

## 2024-02-14 DIAGNOSIS — S0990XA Unspecified injury of head, initial encounter: Secondary | ICD-10-CM | POA: Insufficient documentation

## 2024-02-14 DIAGNOSIS — Z043 Encounter for examination and observation following other accident: Secondary | ICD-10-CM | POA: Diagnosis not present

## 2024-02-14 DIAGNOSIS — I4891 Unspecified atrial fibrillation: Secondary | ICD-10-CM | POA: Insufficient documentation

## 2024-02-14 DIAGNOSIS — M4802 Spinal stenosis, cervical region: Secondary | ICD-10-CM | POA: Diagnosis not present

## 2024-02-14 DIAGNOSIS — W101XXA Fall (on)(from) sidewalk curb, initial encounter: Secondary | ICD-10-CM | POA: Insufficient documentation

## 2024-02-14 DIAGNOSIS — S01111A Laceration without foreign body of right eyelid and periocular area, initial encounter: Secondary | ICD-10-CM | POA: Insufficient documentation

## 2024-02-14 DIAGNOSIS — M47812 Spondylosis without myelopathy or radiculopathy, cervical region: Secondary | ICD-10-CM | POA: Diagnosis not present

## 2024-02-14 DIAGNOSIS — S0591XA Unspecified injury of right eye and orbit, initial encounter: Secondary | ICD-10-CM | POA: Diagnosis present

## 2024-02-14 DIAGNOSIS — G9389 Other specified disorders of brain: Secondary | ICD-10-CM | POA: Diagnosis not present

## 2024-02-14 LAB — CBC
HCT: 46.4 % (ref 39.0–52.0)
Hemoglobin: 15.6 g/dL (ref 13.0–17.0)
MCH: 30.1 pg (ref 26.0–34.0)
MCHC: 33.6 g/dL (ref 30.0–36.0)
MCV: 89.4 fL (ref 80.0–100.0)
Platelets: 206 K/uL (ref 150–400)
RBC: 5.19 MIL/uL (ref 4.22–5.81)
RDW: 13 % (ref 11.5–15.5)
WBC: 11.2 K/uL — ABNORMAL HIGH (ref 4.0–10.5)
nRBC: 0 % (ref 0.0–0.2)

## 2024-02-14 LAB — BASIC METABOLIC PANEL WITH GFR
Anion gap: 11 (ref 5–15)
BUN: 16 mg/dL (ref 8–23)
CO2: 26 mmol/L (ref 22–32)
Calcium: 9.5 mg/dL (ref 8.9–10.3)
Chloride: 105 mmol/L (ref 98–111)
Creatinine, Ser: 1.3 mg/dL — ABNORMAL HIGH (ref 0.61–1.24)
GFR, Estimated: 58 mL/min — ABNORMAL LOW (ref >60)
Glucose, Bld: 102 mg/dL — ABNORMAL HIGH (ref 70–99)
Potassium: 4.1 mmol/L (ref 3.5–5.1)
Sodium: 142 mmol/L (ref 135–145)

## 2024-02-14 MED ORDER — BACITRACIN ZINC 500 UNIT/GM EX OINT
TOPICAL_OINTMENT | Freq: Once | CUTANEOUS | Status: AC
  Filled 2024-02-14: qty 0.9

## 2024-02-14 NOTE — ED Triage Notes (Signed)
 Patient states that he was walking on uneven sidewalk and fell and his head/face on the concrete about 20 min ago.  Patient is on a blood thinner.  Patient has laceration to the side of his right eyebrow.  Patient denies LOC.  Patient has no bleeding at this time.

## 2024-02-14 NOTE — Discharge Instructions (Signed)

## 2024-02-14 NOTE — ED Notes (Signed)
 Patient is being discharged from the Urgent Care and sent to the [facility] Emergency Department via private vehicle with wife . Per Dr. Kriste, patient is in need of higher level of care due to Facial Injury/Laceration and on blood thinner. Patient is aware and verbalizes understanding of plan of care.  Vitals:   02/14/24 1415  BP: (!) 179/95  Pulse: 63  Resp: 17  Temp: 98.1 F (36.7 C)  SpO2: 94%

## 2024-02-14 NOTE — ED Provider Notes (Signed)
 [facility]    CSN: [id number] Arrival date & time: 02/14/24  1405      History   Chief Complaint Chief Complaint  Patient presents with   Fall   Facial Laceration    HPI Michael Blevins is a 73 y.o. male.   HPI   Michael Blevins presents after fall today while at a church event today. He tripped on the uneven side walk at the fundraising event just prior to arrival. Has left forehead/eyebrow laceration. He applied pressure to the wound prior to arrival. Bleeding is much better now. There was no loss of consciousness.  He takes Xarelto  for AFIB.   He endorses a headache. He is not having any confusion, difficulty speaking or walking. Wife reports he is at his baseline.     Past Medical History:  Diagnosis Date   Allergy    cats and pollen   Arthritis    Asthma    Atrial fibrillation (HCC)    Atrial septal defect    Atrial septal defect    Back pain    Cellulitis    Dyspnea    Gout    Hematuria    History of chicken pox    History of kidney stones    Hyperglycemia    Hyperlipidemia    Hypertension    Impaired fasting glucose    Motion sickness    Primary osteoarthritis of left knee    Pulmonary hypertension (HCC)    Sleep apnea    SOB (shortness of breath) on exertion    Venous insufficiency     Patient Active Problem List   Diagnosis Date Noted   Left hip pain 11/02/2023   Abnormal liver function tests 10/22/2023   Dog scratch 08/05/2023   Hemorrhoid 06/15/2023   Persistent cough 02/04/2023   History of COVID-19 02/04/2023   Gout 11/17/2022   SOB (shortness of breath) 10/14/2022   Rash 06/30/2022   Acquired thrombophilia (HCC) 09/16/2021   Hyperglycemia 05/19/2020   Elevated hemoglobin (HCC) 11/01/2018   Pulmonary hypertension (HCC) 11/02/2017   Lymphedema 10/28/2017   Cellulitis 10/12/2017   Dizziness 08/03/2017   Impaired fasting glucose 03/17/2017   Arthritis of left knee 12/03/2016   Primary osteoarthritis of left knee 11/26/2016    Encounter for therapeutic drug monitoring 07/17/2016   Atrial septal defect 07/05/2016   SOB (shortness of breath) on exertion 05/20/2016   Atrial septal defect, secundum 05/20/2016   HLD (hyperlipidemia) 07/11/2015   BP (high blood pressure) 07/11/2015   Elevated fasting blood sugar 07/11/2015   Calculus of kidney 07/11/2015   Health care maintenance 11/06/2014   Atrial fibrillation (HCC) 07/25/2014   Essential hypertension 07/25/2014   Hypercholesterolemia 07/25/2014   Nephrolithiasis 07/25/2014   Obstructive sleep apnea syndrome 07/25/2014   Venous insufficiency 07/25/2014   Apnea, sleep 02/11/2014   H/O cardiac catheterization 02/11/2014   Hematuria 10/15/2011   Skin lesion 10/15/2011   Back ache 09/26/2011   Adiposity 09/26/2011    Past Surgical History:  Procedure Laterality Date   APPENDECTOMY     CARDIAC CATHETERIZATION     CATARACT EXTRACTION W/PHACO Right 12/26/2021   Procedure: CATARACT EXTRACTION PHACO AND INTRAOCULAR LENS PLACEMENT (IOC) RIGHT;  Surgeon: Mittie Gaskin, MD;  Location: [facility];  Service: Ophthalmology;  Laterality: Right;  4.81 00:44.4   COLONOSCOPY N/A 09/11/2023   Procedure: COLONOSCOPY;  Surgeon: Onita Elspeth Ozell, DO;  Location: [facility];  Service: Gastroenterology;  Laterality: N/A;   COLONOSCOPY WITH PROPOFOL  N/A 07/10/2023  Procedure: COLONOSCOPY WITH PROPOFOL ;  Surgeon: Onita Elspeth Sharper, DO;  Location: [facility];  Service: Gastroenterology;  Laterality: N/A;   ENDOVENOUS ABLATION SAPHENOUS VEIN W/ LASER Right    EYE SURGERY     LEG SKIN LESION  BIOPSY / EXCISION Right    POLYPECTOMY  07/10/2023   Procedure: POLYPECTOMY;  Surgeon: Onita Elspeth Sharper, DO;  Location: [facility] ENDOSCOPY;  Service: Gastroenterology;;   POLYPECTOMY  09/11/2023   Procedure: POLYPECTOMY;  Surgeon: Onita Elspeth Sharper, DO;  Location: [facility] END[facility];  Service: Gastroenterology;;       Home Medications    Prior to Admission  medications   Medication Sig Start Date End Date Taking? Authorizing Provider  allopurinol  (ZYLOPRIM ) 100 MG tablet Take 1 tablet (100 mg total) by mouth daily. 06/12/23   Glendia Shad, MD  amLODipine  (NORVASC ) 10 MG tablet TAKE 1 TABLET(10 MG) BY MOUTH DAILY 01/30/24   Gollan, Timothy J, MD  betamethasone  valerate ointment (VALISONE ) 0.1 % Apply 1 Application topically 2 (two) times daily. 10/29/23   Narendra, Nischal, MD  brimonidine  (ALPHAGAN ) 0.2 % ophthalmic solution Place 1 drop into the right eye 2 (two) times daily. 05/03/22   [provider]  carvedilol  (COREG ) 25 MG tablet TAKE 1 TABLET(25 MG) BY MOUTH TWICE DAILY 03/04/23   Gollan, Timothy J, MD  docusate sodium  (STOOL SOFTENER) 100 MG capsule Take 100 mg by mouth daily.    [provider]  finasteride  (PROSCAR ) 5 MG tablet Take 1 tablet (5 mg total) by mouth daily. 06/12/23   Glendia Shad, MD  furosemide  (LASIX ) 40 MG tablet TAKE 1 TABLET(40 MG) BY MOUTH DAILY AS NEEDED 08/27/23   Gollan, Timothy J, MD  glucosamine-chondroitin 500-400 MG tablet Take 1 tablet by mouth 3 (three) times daily.    [provider]  lisinopril  (ZESTRIL ) 40 MG tablet Take 1 tablet (40 mg total) by mouth daily. 10/22/23   Glendia Shad, MD  lovastatin  (MEVACOR ) 20 MG tablet Take 1 tablet (20 mg total) by mouth daily. 10/22/23   Glendia Shad, MD  Multiple Vitamin (MULTIVITAMIN) tablet Take 1 tablet by mouth daily.    [provider]  Omega-3 Fatty Acids (FISH OIL) 1000 MG CPDR Take by mouth.    [provider]  rivaroxaban  (XARELTO ) 20 MG TABS tablet Take 1 tablet (20 mg total) by mouth daily with supper. 10/23/23   Perla Evalene PARAS, MD    Family History Family History  Problem Relation Age of Onset   Hypertension Mother    Diabetes Father    Kidney disease Neg Hx    Prostate cancer Neg Hx     Social History Social History   Tobacco Use   Smoking status: Former    Types: Cigarettes   Smokeless tobacco:  Former   Tobacco comments:    quit 33 years  Vaping Use   Vaping status: Never Used  Substance Use Topics   Alcohol use: No    Alcohol/week: 0.0 standard drinks of alcohol   Drug use: No     Allergies   Patient has no known allergies.   Review of Systems Review of Systems: negative unless otherwise stated in HPI.      Physical Exam Triage Vital Signs ED Triage Vitals  Encounter Vitals Group     BP 02/14/24 1415 (!) 179/95     Girls Systolic BP Percentile --      Girls Diastolic BP Percentile --      Boys Systolic BP Percentile --  Boys Diastolic BP Percentile --      Pulse Rate 02/14/24 1415 63     Resp 02/14/24 1415 17     Temp 02/14/24 1415 98.1 F (36.7 C)     Temp Source 02/14/24 1415 Oral     SpO2 02/14/24 1415 94 %     Weight 02/14/24 1414 (!) 302 lb 11.1 oz (137.3 kg)     Height 02/14/24 1414 6' 1 (1.854 m)     Head Circumference --      Peak Flow --      Pain Score 02/14/24 1414 3     Pain Loc --      Pain Education --      Exclude from Growth Chart --    No data found.  Updated Vital Signs BP (!) 179/95 (BP Location: Right Arm)   Pulse 63   Temp 98.1 F (36.7 C) (Oral)   Resp 17   Ht 6' 1 (1.854 m)   Wt (!) 137.3 kg   SpO2 94%   BMI 39.94 kg/m   Visual Acuity Right Eye Distance:   Left Eye Distance:   Bilateral Distance:    Right Eye Near:   Left Eye Near:    Bilateral Near:     Physical Exam GEN: Alert, male in no acute distress  EYES: Extraocular movements intact, pupils equal round and reactive to light HENT: Moist mucous membranes, no oropharyngeal lesions, no blood visble, no hematoma visible. 1.5 cm laceration to left forehead/distal brow near frontotemporal area  CV: regular rate and rhythm RESP: no increased work of breathing, clear to ascultation bilaterally MSK: chronic lower extremity edema worse on the right  SKIN: warm, dry, laceration as documented above  NEURO: alert, moves all extremities appropriately,  strength 5/5 bilateral upper and lower extremities, alert and oriented, normal speech     UC Treatments / Results  Labs (all labs ordered are listed, but only abnormal results are displayed) Labs Reviewed - No data to display  EKG   Radiology No results found.  Procedures Procedures (including critical care time)  Medications Ordered in UC Medications - No data to display  Initial Impression / Assessment and Plan / UC Course  I have reviewed the triage vital signs and the nursing notes.  Pertinent labs & imaging results that were available during my care of the patient were reviewed by me and considered in my medical decision making (see chart for details).       Patient is a 73 y.o. male who presents for head injury that occurred just prior to arrival at the fundraising event at his church. He is hypertensive, satting 94% on room air.   His neuro exam is completely normal.  Given his fall with head injury while on Xarelto . Michael Blevins likely needs to be cleared with a CT Head.  Recommended ED evaluation. Wife will drive him to [facility] ED for further evaluation of his head wound.    Discussed MDM, treatment plan and plan for follow-up with patient and his wife who agree with plan.       Final Clinical Impressions(s) / UC Diagnoses   Final diagnoses:  Injury of head, initial encounter  Chronic anticoagulation  Fall, initial encounter  Elevated blood pressure reading with diagnosis of hypertension     Discharge Instructions      You have been advised to follow up immediately in the emergency department for concerning signs or symptoms as discussed during your visit. If you declined EMS  transport, please have a family member take you directly to the ED at this time. Do not delay.   Based on concerns about condition, if you do not follow up in the ED, you may risk poor outcomes including worsening of condition, delayed treatment and potentially life threatening issues. If you  have declined to go to the ED at this time, you should call your PCP immediately to set up a follow up appointment.   Go to ED for red flag symptoms, including; fevers you cannot reduce with Tylenol /Motrin, severe headaches, vision changes, numbness/weakness in part of the body, lethargy, confusion, intractable vomiting, severe dehydration, chest pain, breathing difficulty, severe persistent abdominal or pelvic pain, signs of severe infection (increased redness, swelling of an area), feeling faint or passing out, dizziness, etc. You should especially go to the ED for sudden acute worsening of condition if you do not elect to go at this time.       ED Prescriptions   None    PDMP not reviewed this encounter.   Marrion Accomando, DO 02/14/24 1441

## 2024-02-14 NOTE — ED Triage Notes (Addendum)
 Pt to ED via POV from UC. Pt reports was walking on uneven sidewalk and tripped and fell. Pt hit right side of face. Pt denies LOC. Pt is on Xarelto . Pt has laceration to right eyebrow.   Blue top sent if needed

## 2024-02-14 NOTE — ED Provider Notes (Signed)
 [facility] Provider Note    Event Date/Time   First MD Initiated Contact with Patient 02/14/24 1503     (approximate)   History   Chief Complaint Fall   HPI  Michael Blevins is a 73 y.o. male with past medical history of hypertension, hyperlipidemia, and atrial fibrillation on Xarelto  who presents to the ED complaining of fall.  Patient reports that just prior to arrival he tripped on an uneven curb and fell forward, striking his right knee as well as the side of his head.  He denies any loss of consciousness, does report a mild headache but denies any neck pain.  He has been ambulatory since the fall, denies any pain to his trunk or extremities.     Physical Exam   Triage Vital Signs: ED Triage Vitals  Encounter Vitals Group     BP 02/14/24 1451 (!) 149/87     Girls Systolic BP Percentile --      Girls Diastolic BP Percentile --      Boys Systolic BP Percentile --      Boys Diastolic BP Percentile --      Pulse Rate 02/14/24 1451 66     Resp 02/14/24 1451 18     Temp 02/14/24 1451 98.2 F (36.8 C)     Temp Source 02/14/24 1451 Oral     SpO2 02/14/24 1451 97 %     Weight 02/14/24 1452 295 lb (133.8 kg)     Height 02/14/24 1452 6' 1 (1.854 m)     Head Circumference --      Peak Flow --      Pain Score 02/14/24 1452 3     Pain Loc --      Pain Education --      Exclude from Growth Chart --     Most recent vital signs: Vitals:   02/14/24 1451  BP: (!) 149/87  Pulse: 66  Resp: 18  Temp: 98.2 F (36.8 C)  SpO2: 97%    Constitutional: Alert and oriented. Eyes: Conjunctivae are normal. Head: Superficial laceration to right eyebrow. Nose: No congestion/rhinnorhea. Mouth/Throat: Mucous membranes are moist.  Neck: No midline cervical spine tenderness to palpation. Cardiovascular: Normal rate, regular rhythm. Grossly normal heart sounds.  2+ radial pulses bilaterally. Respiratory: Normal respiratory effort.  No retractions. Lungs CTAB.   No chest wall tenderness to palpation. Gastrointestinal: Soft and nontender. No distention. Musculoskeletal: No lower extremity tenderness nor edema.  No upper extremity bony tenderness to palpation. Neurologic:  Normal speech and language. No gross focal neurologic deficits are appreciated.    ED Results / Procedures / Treatments   Labs (all labs ordered are listed, but only abnormal results are displayed) Labs Reviewed  CBC - Abnormal; Notable for the following components:      Result Value   WBC 11.2 (*)    All other components within normal limits  BASIC METABOLIC PANEL WITH GFR - Abnormal; Notable for the following components:   Glucose, Bld 102 (*)    Creatinine, Ser 1.30 (*)    GFR, Estimated 58 (*)    All other components within normal limits    RADIOLOGY CT head reviewed and interpreted by me with no hemorrhage or midline shift.  PROCEDURES:  Critical Care performed: No  Procedures   MEDICATIONS ORDERED IN ED: Medications - No data to display   IMPRESSION / MDM / ASSESSMENT AND PLAN / ED COURSE  I reviewed the triage vital signs and  the nursing notes.                              73 y.o. male with past medical history of hypertension, hyperlipidemia, and atrial fibrillation on Xarelto  who presents to the ED following trip and fall just prior to arrival, striking his head.  Patient's presentation is most consistent with acute presentation with potential threat to life or bodily function.  Differential diagnosis includes, but is not limited to, intracranial hemorrhage, skull fracture, cervical fracture, head contusion.  Patient nontoxic-appearing and in no acute distress, vital signs are unremarkable.  We will check CT head and cervical spine, lab results also pending.  Laceration is superficial and not in need of repair, patient's tetanus was updated earlier this year.  No evidence of traumatic injury to his trunk or extremities.  CT head and cervical spine  are negative for acute process, labs without significant anemia, leukocytosis, or electrolyte abnormality.  Patient does have mild AKI and was counseled to recheck labs with his PCP, otherwise appropriate for discharge home and was counseled to return to the ED for new or worsening symptoms.  Patient and spouse agree with plan.      FINAL CLINICAL IMPRESSION(S) / ED DIAGNOSES   Final diagnoses:  Fall, initial encounter  Injury of head, initial encounter     Rx / DC Orders   ED Discharge Orders     None        Note:  This document was prepared using Dragon voice recognition software and may include unintentional dictation errors.   Willo Dunnings, MD 02/14/24 [phone]

## 2024-02-14 NOTE — ED Notes (Signed)
 Wound cleaned & bacitracin  applied.

## 2024-02-23 ENCOUNTER — Ambulatory Visit: Payer: Self-pay | Admitting: Internal Medicine

## 2024-02-23 ENCOUNTER — Other Ambulatory Visit (INDEPENDENT_AMBULATORY_CARE_PROVIDER_SITE_OTHER)

## 2024-02-23 DIAGNOSIS — E7800 Pure hypercholesterolemia, unspecified: Secondary | ICD-10-CM | POA: Diagnosis not present

## 2024-02-23 DIAGNOSIS — R739 Hyperglycemia, unspecified: Secondary | ICD-10-CM

## 2024-02-23 DIAGNOSIS — I10 Essential (primary) hypertension: Secondary | ICD-10-CM | POA: Diagnosis not present

## 2024-02-23 LAB — BASIC METABOLIC PANEL WITH GFR
BUN: 14 mg/dL (ref 6–23)
CO2: 30 meq/L (ref 19–32)
Calcium: 9.1 mg/dL (ref 8.4–10.5)
Chloride: 103 meq/L (ref 96–112)
Creatinine, Ser: 0.94 mg/dL (ref 0.40–1.50)
GFR: 80.24 mL/min (ref >60.00)
Glucose, Bld: 102 mg/dL — ABNORMAL HIGH (ref 70–99)
Potassium: 3.7 meq/L (ref 3.5–5.1)
Sodium: 141 meq/L (ref 135–145)

## 2024-02-23 LAB — HEPATIC FUNCTION PANEL
ALT: 19 U/L (ref 0–53)
AST: 22 U/L (ref 0–37)
Albumin: 4.1 g/dL (ref 3.5–5.2)
Alkaline Phosphatase: 61 U/L (ref 39–117)
Bilirubin, Direct: 0.6 mg/dL — ABNORMAL HIGH (ref 0.0–0.3)
Total Bilirubin: 2 mg/dL — ABNORMAL HIGH (ref 0.2–1.2)
Total Protein: 6.6 g/dL (ref 6.0–8.3)

## 2024-02-23 LAB — LIPID PANEL
Cholesterol: 92 mg/dL (ref 0–200)
HDL: 29.4 mg/dL — ABNORMAL LOW (ref >39.00)
LDL Cholesterol: 48 mg/dL (ref 0–99)
NonHDL: 62.19
Total CHOL/HDL Ratio: 3
Triglycerides: 70 mg/dL (ref 0.0–149.0)
VLDL: 14 mg/dL (ref 0.0–40.0)

## 2024-02-23 LAB — HEMOGLOBIN A1C: Hgb A1c MFr Bld: 5.9 % (ref 4.6–6.5)

## 2024-02-26 ENCOUNTER — Ambulatory Visit: Admitting: Internal Medicine

## 2024-02-26 DIAGNOSIS — I272 Pulmonary hypertension, unspecified: Secondary | ICD-10-CM | POA: Diagnosis not present

## 2024-02-26 DIAGNOSIS — I4891 Unspecified atrial fibrillation: Secondary | ICD-10-CM | POA: Diagnosis not present

## 2024-02-26 DIAGNOSIS — I872 Venous insufficiency (chronic) (peripheral): Secondary | ICD-10-CM | POA: Diagnosis not present

## 2024-02-26 DIAGNOSIS — R739 Hyperglycemia, unspecified: Secondary | ICD-10-CM | POA: Diagnosis not present

## 2024-02-26 DIAGNOSIS — G4733 Obstructive sleep apnea (adult) (pediatric): Secondary | ICD-10-CM | POA: Diagnosis not present

## 2024-02-26 DIAGNOSIS — I10 Essential (primary) hypertension: Secondary | ICD-10-CM | POA: Diagnosis not present

## 2024-02-26 DIAGNOSIS — W19XXXS Unspecified fall, sequela: Secondary | ICD-10-CM

## 2024-02-26 DIAGNOSIS — E7800 Pure hypercholesterolemia, unspecified: Secondary | ICD-10-CM

## 2024-02-26 DIAGNOSIS — R319 Hematuria, unspecified: Secondary | ICD-10-CM

## 2024-02-26 MED ORDER — LOVASTATIN 20 MG PO TABS: 20.0000 mg | ORAL_TABLET | Freq: Every day | ORAL | 1 refills | Status: DC

## 2024-02-26 NOTE — Progress Notes (Signed)
 Subjective:    Patient ID: Michael Blevins, male    DOB: 11-Mar-1951, 73 y.o.   MRN: [id number]  Patient here for  Chief Complaint  Patient presents with   Medical Management of Chronic Issues    HPI Here for a scheduled follow up - follow up regarding afib, sleep apnea, hypercholesterolemia and hypertension. Last visit with me, left hip pain - referred to PT. Evaluated 10/29/23 - sob. Lasix  increased for a few days. CXR - ok. Breathing improved. Was seen 02/14/24 - s/p fall - tripped over uneven curb - striking his right knee and side of head. CT head and c-spine - negative for acute process. Did notice right knee issues - 6 days after fall. No increased pain. Breathing overall stable. No chest pain reported. No abdominal pain or bowel change reported.    Past Medical History:  Diagnosis Date   Allergy    cats and pollen   Arthritis    Asthma    Atrial fibrillation (HCC)    Atrial septal defect    Atrial septal defect    Back pain    Cellulitis    Dyspnea    Gout    Hematuria    History of chicken pox    History of kidney stones    Hyperglycemia    Hyperlipidemia    Hypertension    Impaired fasting glucose    Motion sickness    Primary osteoarthritis of left knee    Pulmonary hypertension (HCC)    Sleep apnea    SOB (shortness of breath) on exertion    Venous insufficiency    Past Surgical History:  Procedure Laterality Date   APPENDECTOMY     CARDIAC CATHETERIZATION     CATARACT EXTRACTION W/PHACO Right 12/26/2021   Procedure: CATARACT EXTRACTION PHACO AND INTRAOCULAR LENS PLACEMENT (IOC) RIGHT;  Surgeon: Mittie Gaskin, MD;  Location: [facility];  Service: Ophthalmology;  Laterality: Right;  4.81 00:44.4   COLONOSCOPY N/A 09/11/2023   Procedure: COLONOSCOPY;  Surgeon: Onita Elspeth Ozell, DO;  Location: [facility];  Service: Gastroenterology;  Laterality: N/A;   COLONOSCOPY WITH PROPOFOL  N/A 07/10/2023   Procedure: COLONOSCOPY WITH PROPOFOL ;   Surgeon: Onita Elspeth Ozell, DO;  Location: [facility] ENDOSCOPY;  Service: Gastroenterology;  Laterality: N/A;   ENDOVENOUS ABLATION SAPHENOUS VEIN W/ LASER Right    EYE SURGERY     LEG SKIN LESION  BIOPSY / EXCISION Right    POLYPECTOMY  07/10/2023   Procedure: POLYPECTOMY;  Surgeon: Onita Elspeth Ozell, DO;  Location: [facility] ENDOSCOPY;  Service: Gastroenterology;;   POLYPECTOMY  09/11/2023   Procedure: POLYPECTOMY;  Surgeon: Onita Elspeth Ozell, DO;  Location: [facility] ENDOSCOPY;  Service: Gastroenterology;;   Family History  Problem Relation Age of Onset   Hypertension Mother    Diabetes Father    Kidney disease Neg Hx    Prostate cancer Neg Hx    Social History   Socioeconomic History   Marital status: Married    Spouse name: Not on file   Number of children: Not on file   Years of education: Not on file   Highest education level: Not on file  Occupational History   Occupation: retired  Tobacco Use   Smoking status: Former    Types: Cigarettes   Smokeless tobacco: Former   Tobacco comments:    quit 33 years  Vaping Use   Vaping status: Never Used  Substance and Sexual Activity   Alcohol use: No    Alcohol/week: 0.0 standard  drinks of alcohol   Drug use: No   Sexual activity: Not on file  Other Topics Concern   Not on file  Social History Narrative   Not on file   Social Drivers of Health   Financial Resource Strain: Low Risk  (04/24/2023)   Received from [facility]   Overall Financial Resource Strain (CARDIA)    Difficulty of Paying Living Expenses: Not very hard  Food Insecurity: No Food Insecurity (04/24/2023)   Received from [facility]   Hunger Vital Sign    Within the past 12 months, you worried that your food would run out before you got the money to buy more.: Never true    Within the past 12 months, the food you bought just didn't last and you didn't have money to get more.: Never true  Transportation Needs: No  Transportation Needs (04/24/2023)   Received from [facility] - Transportation    In the past 12 months, has lack of transportation kept you from medical appointments or from getting medications?: No    Lack of Transportation (Non-Medical): No  Physical Activity: Not on file  Stress: Not on file  Social Connections: Not on file     Review of Systems  Constitutional:  Negative for appetite change and unexpected weight change.  HENT:  Negative for congestion and sinus pressure.   Respiratory:  Negative for cough and chest tightness.        Breathing stable. No increased sob reported.   Cardiovascular:  Negative for chest pain and palpitations.       No increased swelling.   Gastrointestinal:  Negative for abdominal pain, diarrhea, nausea and vomiting.  Genitourinary:  Negative for difficulty urinating and dysuria.  Musculoskeletal:  Negative for myalgias.  Skin:  Negative for color change and rash.  Neurological:  Negative for dizziness and headaches.  Psychiatric/Behavioral:  Negative for agitation and dysphoric mood.        Objective:     BP 110/68   Pulse 66   Resp 16   Ht 6' 1 (1.854 m)   Wt 299 lb (135.6 kg)   SpO2 99%   BMI 39.45 kg/m  Wt Readings from Last 3 Encounters:  02/26/24 299 lb (135.6 kg)  02/14/24 295 lb (133.8 kg)  02/14/24 (!) 302 lb 11.1 oz (137.3 kg)    Physical Exam Vitals reviewed.  Constitutional:      General: He is not in acute distress.    Appearance: Normal appearance. He is well-developed.  HENT:     Head: Normocephalic and atraumatic.     Right Ear: External ear normal.     Left Ear: External ear normal.     Mouth/Throat:     Pharynx: No oropharyngeal exudate or posterior oropharyngeal erythema.  Eyes:     General: No scleral icterus.       Right eye: No discharge.        Left eye: No discharge.     Conjunctiva/sclera: Conjunctivae normal.  Cardiovascular:     Rate and Rhythm: Normal rate and regular  rhythm.  Pulmonary:     Effort: Pulmonary effort is normal. No respiratory distress.     Breath sounds: Normal breath sounds.  Abdominal:     General: Bowel sounds are normal.     Palpations: Abdomen is soft.     Tenderness: There is no abdominal tenderness.  Musculoskeletal:        General: No tenderness.  Cervical back: Neck supple. No tenderness.     Comments: No increased swelling.   Lymphadenopathy:     Cervical: No cervical adenopathy.  Skin:    Findings: No erythema or rash.  Neurological:     Mental Status: He is alert.  Psychiatric:        Mood and Affect: Mood normal.        Behavior: Behavior normal.         Outpatient Encounter Medications as of 02/26/2024  Medication Sig   allopurinol  (ZYLOPRIM ) 100 MG tablet Take 1 tablet (100 mg total) by mouth daily.   amLODipine  (NORVASC ) 10 MG tablet TAKE 1 TABLET(10 MG) BY MOUTH DAILY   betamethasone  valerate ointment (VALISONE ) 0.1 % Apply 1 Application topically 2 (two) times daily.   brimonidine  (ALPHAGAN ) 0.2 % ophthalmic solution Place 1 drop into the right eye 2 (two) times daily.   docusate sodium  (STOOL SOFTENER) 100 MG capsule Take 100 mg by mouth daily.   finasteride  (PROSCAR ) 5 MG tablet Take 1 tablet (5 mg total) by mouth daily.   furosemide  (LASIX ) 40 MG tablet TAKE 1 TABLET(40 MG) BY MOUTH DAILY AS NEEDED   glucosamine-chondroitin 500-400 MG tablet Take 1 tablet by mouth 3 (three) times daily.   lisinopril  (ZESTRIL ) 40 MG tablet Take 1 tablet (40 mg total) by mouth daily.   lovastatin  (MEVACOR ) 20 MG tablet Take 1 tablet (20 mg total) by mouth daily.   Multiple Vitamin (MULTIVITAMIN) tablet Take 1 tablet by mouth daily.   Omega-3 Fatty Acids (FISH OIL) 1000 MG CPDR Take by mouth.   rivaroxaban  (XARELTO ) 20 MG TABS tablet Take 1 tablet (20 mg total) by mouth daily with supper.   [DISCONTINUED] carvedilol  (COREG ) 25 MG tablet TAKE 1 TABLET(25 MG) BY MOUTH TWICE DAILY   [DISCONTINUED] lovastatin  (MEVACOR ) 20  MG tablet Take 1 tablet (20 mg total) by mouth daily.   No facility-administered encounter medications on file as of 02/26/2024.     Lab Results  Component Value Date   WBC 11.2 (H) 02/14/2024   HGB 15.6 02/14/2024   HCT 46.4 02/14/2024   PLT 206 02/14/2024   GLUCOSE 102 (H) 02/23/2024   CHOL 92 02/23/2024   TRIG 70.0 02/23/2024   HDL 29.40 (L) 02/23/2024   LDLDIRECT 77.0 04/06/2018   LDLCALC 48 02/23/2024   ALT 21 03/04/2024   AST 24 03/04/2024   NA 141 02/23/2024   K 3.7 02/23/2024   CL 103 02/23/2024   CREATININE 0.94 02/23/2024   BUN 14 02/23/2024   CO2 30 02/23/2024   TSH 2.55 06/06/2023   PSA 0.50 11/12/2023   INR 3.0 10/24/2021   HGBA1C 5.9 02/23/2024    CT Cervical Spine Wo Contrast Result Date: 02/14/2024 CLINICAL DATA:  Status post fall. EXAM: CT CERVICAL SPINE WITHOUT CONTRAST TECHNIQUE: Multidetector CT imaging of the cervical spine was performed without intravenous contrast. Multiplanar CT image reconstructions were also generated. RADIATION DOSE REDUCTION: This exam was performed according to the departmental dose-optimization program which includes automated exposure control, adjustment of the mA and/or kV according to patient size and/or use of iterative reconstruction technique. COMPARISON:  None Available. FINDINGS: Alignment: Normal. Skull base and vertebrae: No acute fracture. No primary bone lesion or focal pathologic process. Soft tissues and spinal canal: No prevertebral fluid or swelling. No visible canal hematoma. Disc levels: Mild multilevel endplate sclerosis is noted throughout the cervical spine with mild anterior osteophyte formation seen at the levels of C5-C6 and C6-C7. Mild to moderate severity  intervertebral disc space narrowing is seen at C5-C6 and C6-C7, with mild intervertebral disc space narrowing present throughout the remainder of the cervical spine. Bilateral moderate to marked severity multilevel facet joint hypertrophy is noted. Upper chest:  Negative. Other: None. IMPRESSION: 1. No acute fracture or subluxation in the cervical spine. 2. Mild to moderate severity multilevel degenerative changes, most prominent at C5-C6 and C6-C7. Electronically Signed   By: Suzen Dials M.D.   On: 02/14/2024 15:39   CT Head Wo Contrast Result Date: 02/14/2024 CLINICAL DATA:  Status post fall. EXAM: CT HEAD WITHOUT CONTRAST TECHNIQUE: Contiguous axial images were obtained from the base of the skull through the vertex without intravenous contrast. RADIATION DOSE REDUCTION: This exam was performed according to the departmental dose-optimization program which includes automated exposure control, adjustment of the mA and/or kV according to patient size and/or use of iterative reconstruction technique. COMPARISON:  None Available. FINDINGS: Brain: There is generalized cerebral atrophy with widening of the extra-axial spaces and ventricular dilatation. There are areas of decreased attenuation within the white matter tracts of the supratentorial brain, consistent with microvascular disease changes. Vascular: No hyperdense vessel or unexpected calcification. Skull: Normal. Negative for fracture or focal lesion. Sinuses/Orbits: No acute finding. Other: None. IMPRESSION: No acute intracranial abnormality. Electronically Signed   By: Suzen Dials M.D.   On: 02/14/2024 15:37       Assessment & Plan:  Hyperbilirubinemia Assessment & Plan: Bilirubin 2 with direct .6mg . Remainder of liver panel wnl.  Schedule for recheck liver panel.    Orders: -     Hepatic function panel; Future  Essential hypertension Assessment & Plan: On carvedilol , amlodipine  and lisinopril . Blood pressure as outlined.  Follow met b. No change in medication today.   Orders: -     Basic metabolic panel with GFR; Future -     TSH; Future  Hypercholesterolemia Assessment & Plan: On lovastatin .  Low cholesterol diet and exercise.  Follow lipid panel. No changes in medication today.    Lab Results  Component Value Date   CHOL 92 02/23/2024   HDL 29.40 (L) 02/23/2024   LDLCALC 48 02/23/2024   LDLDIRECT 77.0 04/06/2018   TRIG 70.0 02/23/2024   CHOLHDL 3 02/23/2024     Orders: -     Lipid panel; Future -     Hepatic function panel; Future -     TSH; Future  Hyperglycemia Assessment & Plan: Low carb diet and exercise. Follow met b and A1c.  Lab Results  Component Value Date   HGBA1C 5.9 02/23/2024     Orders: -     Hemoglobin A1c; Future -     TSH; Future  Hematuria, unspecified type Assessment & Plan: Has been worked up by urology previously.  On finasteride .  Recent hematuria. Urinalysis recheck - no red blood cells.   Orders: -     TSH; Future  Venous insufficiency Assessment & Plan: Continue compression hose. Improved.    Pulmonary hypertension (HCC) Assessment & Plan: Continue cpap. Continue f/u with cardiology.    Obstructive sleep apnea syndrome Assessment & Plan: Uses cpap regularly. Benefiting from use.    Atrial fibrillation, unspecified type [facility]) Assessment & Plan: Followed by cardiology. On xarelto .  Continue carvedilol .  Rate controlled. Stable. Continue f/u with cardiology.    Fall, sequela Assessment & Plan: Was seen 02/14/24 - s/p fall - tripped over uneven curb - striking his right knee and side of head. CT head and c-spine - negative for acute process.  Did notice right knee issues - 6 days after fall. No increased pain. Follow.    Other orders -     Lovastatin ; Take 1 tablet (20 mg total) by mouth daily.  Dispense: 90 tablet; Refill: 1     Allena Hamilton, MD

## 2024-02-28 ENCOUNTER — Other Ambulatory Visit: Payer: Self-pay | Admitting: Cardiovascular Disease

## 2024-03-04 ENCOUNTER — Other Ambulatory Visit (INDEPENDENT_AMBULATORY_CARE_PROVIDER_SITE_OTHER)

## 2024-03-04 ENCOUNTER — Ambulatory Visit: Payer: Self-pay | Admitting: Internal Medicine

## 2024-03-04 DIAGNOSIS — M25559 Pain in unspecified hip: Secondary | ICD-10-CM | POA: Diagnosis not present

## 2024-03-04 DIAGNOSIS — M25569 Pain in unspecified knee: Secondary | ICD-10-CM | POA: Diagnosis not present

## 2024-03-04 LAB — HEPATIC FUNCTION PANEL
ALT: 21 U/L (ref 0–53)
AST: 24 U/L (ref 0–37)
Albumin: 4.2 g/dL (ref 3.5–5.2)
Alkaline Phosphatase: 66 U/L (ref 39–117)
Bilirubin, Direct: 0.5 mg/dL — ABNORMAL HIGH (ref 0.0–0.3)
Total Bilirubin: 1.6 mg/dL — ABNORMAL HIGH (ref 0.2–1.2)
Total Protein: 6.4 g/dL (ref 6.0–8.3)

## 2024-03-05 ENCOUNTER — Other Ambulatory Visit: Payer: Self-pay | Admitting: *Deleted

## 2024-03-07 ENCOUNTER — Encounter: Payer: Self-pay | Admitting: Internal Medicine

## 2024-03-07 DIAGNOSIS — W19XXXA Unspecified fall, initial encounter: Secondary | ICD-10-CM | POA: Insufficient documentation

## 2024-03-07 NOTE — Assessment & Plan Note (Signed)
 Has been worked up by urology previously.  On finasteride .  Recent hematuria. Urinalysis recheck - no red blood cells.

## 2024-03-07 NOTE — Assessment & Plan Note (Signed)
Continue compression hose.  Improved.

## 2024-03-07 NOTE — Assessment & Plan Note (Signed)
 On carvedilol , amlodipine  and lisinopril . Blood pressure as outlined.  Follow met b. No change in medication today.

## 2024-03-07 NOTE — Assessment & Plan Note (Signed)
 Bilirubin 2 with direct .6mg . Remainder of liver panel wnl.  Schedule for recheck liver panel.

## 2024-03-07 NOTE — Assessment & Plan Note (Signed)
 Continue cpap. Continue f/u with cardiology.

## 2024-03-07 NOTE — Assessment & Plan Note (Signed)
 Low carb diet and exercise. Follow met b and A1c.  Lab Results  Component Value Date   HGBA1C 5.9 02/23/2024

## 2024-03-07 NOTE — Assessment & Plan Note (Signed)
 Uses cpap regularly. Benefiting from use.

## 2024-03-07 NOTE — Assessment & Plan Note (Signed)
 Followed by cardiology. On xarelto .  Continue carvedilol .  Rate controlled. Stable. Continue f/u with cardiology.

## 2024-03-07 NOTE — Assessment & Plan Note (Signed)
 Was seen 02/14/24 - s/p fall - tripped over uneven curb - striking his right knee and side of head. CT head and c-spine - negative for acute process. Did notice right knee issues - 6 days after fall. No increased pain. Follow.

## 2024-03-07 NOTE — Assessment & Plan Note (Signed)
 On lovastatin .  Low cholesterol diet and exercise.  Follow lipid panel. No changes in medication today.   Lab Results  Component Value Date   CHOL 92 02/23/2024   HDL 29.40 (L) 02/23/2024   LDLCALC 48 02/23/2024   LDLDIRECT 77.0 04/06/2018   TRIG 70.0 02/23/2024   CHOLHDL 3 02/23/2024

## 2024-03-08 DIAGNOSIS — R600 Localized edema: Secondary | ICD-10-CM | POA: Diagnosis not present

## 2024-03-08 DIAGNOSIS — L821 Other seborrheic keratosis: Secondary | ICD-10-CM | POA: Diagnosis not present

## 2024-03-08 DIAGNOSIS — Z872 Personal history of diseases of the skin and subcutaneous tissue: Secondary | ICD-10-CM | POA: Diagnosis not present

## 2024-03-08 DIAGNOSIS — Z09 Encounter for follow-up examination after completed treatment for conditions other than malignant neoplasm: Secondary | ICD-10-CM | POA: Diagnosis not present

## 2024-03-15 DIAGNOSIS — H401112 Primary open-angle glaucoma, right eye, moderate stage: Secondary | ICD-10-CM | POA: Diagnosis not present

## 2024-03-15 DIAGNOSIS — H43813 Vitreous degeneration, bilateral: Secondary | ICD-10-CM | POA: Diagnosis not present

## 2024-03-15 DIAGNOSIS — H2512 Age-related nuclear cataract, left eye: Secondary | ICD-10-CM | POA: Diagnosis not present

## 2024-03-30 ENCOUNTER — Other Ambulatory Visit: Payer: Self-pay | Admitting: Cardiovascular Disease

## 2024-04-01 ENCOUNTER — Other Ambulatory Visit (INDEPENDENT_AMBULATORY_CARE_PROVIDER_SITE_OTHER)

## 2024-04-01 LAB — HEPATIC FUNCTION PANEL
ALT: 20 U/L (ref 0–53)
AST: 22 U/L (ref 0–37)
Albumin: 4.3 g/dL (ref 3.5–5.2)
Alkaline Phosphatase: 73 U/L (ref 39–117)
Bilirubin, Direct: 0.5 mg/dL — ABNORMAL HIGH (ref 0.0–0.3)
Total Bilirubin: 1.8 mg/dL — ABNORMAL HIGH (ref 0.2–1.2)
Total Protein: 6.4 g/dL (ref 6.0–8.3)

## 2024-04-02 ENCOUNTER — Other Ambulatory Visit: Payer: Self-pay | Admitting: Cardiovascular Disease

## 2024-04-02 ENCOUNTER — Ambulatory Visit: Payer: Self-pay | Admitting: Internal Medicine

## 2024-04-04 ENCOUNTER — Other Ambulatory Visit: Payer: Self-pay | Admitting: Internal Medicine

## 2024-04-04 DIAGNOSIS — R7989 Other specified abnormal findings of blood chemistry: Secondary | ICD-10-CM

## 2024-04-04 NOTE — Progress Notes (Signed)
Order placed for abdominal ultrasound.   

## 2024-04-06 MED ORDER — CARVEDILOL 25 MG PO TABS: 25.0000 mg | ORAL_TABLET | Freq: Two times a day (BID) | ORAL | 0 refills | Status: DC

## 2024-04-09 ENCOUNTER — Ambulatory Visit
Admission: RE | Admit: 2024-04-09 | Discharge: 2024-04-09 | Disposition: A | Discharge status: Home / Self Care | Source: Ambulatory Visit | Attending: Internal Medicine | Admitting: Internal Medicine

## 2024-04-09 DIAGNOSIS — R7989 Other specified abnormal findings of blood chemistry: Secondary | ICD-10-CM | POA: Diagnosis not present

## 2024-04-09 DIAGNOSIS — K8020 Calculus of gallbladder without cholecystitis without obstruction: Secondary | ICD-10-CM | POA: Diagnosis not present

## 2024-04-09 DIAGNOSIS — R932 Abnormal findings on diagnostic imaging of liver and biliary tract: Secondary | ICD-10-CM | POA: Diagnosis not present

## 2024-04-12 ENCOUNTER — Encounter: Payer: Self-pay | Admitting: Internal Medicine

## 2024-04-12 ENCOUNTER — Ambulatory Visit: Payer: Self-pay | Admitting: Internal Medicine

## 2024-04-12 DIAGNOSIS — R932 Abnormal findings on diagnostic imaging of liver and biliary tract: Secondary | ICD-10-CM

## 2024-04-12 NOTE — Telephone Encounter (Signed)
 Copied from CRM #[id number]. Topic: Clinical - Lab/Test Results >> Apr 12, 2024 11:11 AM [name] wrote: Reason for CRM: Results relayed per missed call/ message. Pt Will be sending Mychart Message

## 2024-04-14 NOTE — Telephone Encounter (Signed)
Order placed for surgery referral.  Pt notified via my chart.

## 2024-04-20 DIAGNOSIS — R17 Unspecified jaundice: Secondary | ICD-10-CM | POA: Diagnosis not present

## 2024-04-24 ENCOUNTER — Other Ambulatory Visit: Payer: Self-pay | Admitting: Cardiovascular Disease

## 2024-04-26 NOTE — Telephone Encounter (Signed)
 Prescription refill request for Xarelto  received.  Indication: a-fib Last office visit: 10/21/2022 Weight: 135.6kg Age: 73 Scr: 0.94 (EPIC 02/23/2024) CrCl: 132   Follow up appt with Dr Perla 05/04/2024  3 month refill sent

## 2024-05-03 NOTE — Progress Notes (Unsigned)
 Cardiology Office Note  Date:  05/04/2024   ID:  Michael Blevins, DOB 1950/10/01, MRN [id number]  PCP:  Glendia Shad, MD   Chief Complaint  Patient presents with   12 month follow up     Doing well.     HPI:  Mr. Michael Blevins is a 73 yo  gentleman with history of  Remote history of smoking for 17 years Obesity, atrial fibrillation, permanent cardioversion in 2011, did not hold,  on chronic anticoagulation  sleep apnea on CPAP,   Essential hypertension,  Hyperlipidemia,  Small atrial septal defect ( QP/QS 1.08 ),  Venous insufficiency,  prior cardiac catheterization in 2011 by report showing no significant coronary disease,  who presents for follow-up of his permanent atrial fibrillation, ASD, hyperlipidemia  Last seen by myself in clinic 5/24  Recent events discussed Reports elevated total bilirubin past several months Ultrasound detailing gallstones, no cholecystitis, seen by surgeon, no surgery planned  No sx from afib, feels a little at night when quiet  Slowly dropping weight, 12 pounds Changing his diet  Chronic leg swelling, wears compression hose Previously declined changing amlodipine  On lasix  40 daily Denies significant shortness of breath on exertion  10-Apr-2025son died, A-fib ablation, complication in the months following procedure  On xarelto , no bleeding Prior history of hematuiria  Labs reviewed Total chol 92, LDL 48 Potassium 3.7 creatinine 0.94 Total bilirubin 1.8   EKG personally reviewed by myself on todays visit EKG Interpretation Date/Time:  Tuesday May 04 2024 09:59:25 EST Ventricular Rate:  66 PR Interval:    QRS Duration:  130 QT Interval:  466 QTC Calculation: 488 R Axis:   -40  Text Interpretation: Atrial fibrillation Left axis deviation Right bundle branch block Inferior infarct , age undetermined No previous ECGs available Confirmed by Perla Lye [phone]) on 05/04/2024 10:01:58 AM    Other past medical history  reviewed Cellulitis in 10/2017 after sitting in a car driving to [facility] [place]  Previously seen at [place],  stress echocardiogram 06/14/2016 performed for shortness of breath, exercised 6 minutes on a Bruce protocol without chest pain or ECG changes. Echo showed no wall motion abnormality concerning for ischemia  echocardiogram revealed normal left ventricular function, with LV ejection fraction greater than 55%. My review of the study showed details of moderate TR, mild to moderately elevated right heart pressures, no mention of his left atrial or right atrial size, no mention of atrial septal defect  chronic mild lower extremity edema, nonpitting , per the notes felt to be secondary to venous insufficiency   negative cardiac cath 2011   Sleep apnea   PMH :   has a past medical history of Allergy, Arthritis, Asthma, Atrial fibrillation (HCC), Atrial septal defect, Atrial septal defect, Back pain, Cellulitis, Dyspnea, Gout, Hematuria, History of chicken pox, History of kidney stones, Hyperglycemia, Hyperlipidemia, Hypertension, Impaired fasting glucose, Motion sickness, Primary osteoarthritis of left knee, Pulmonary hypertension (HCC), Sleep apnea, SOB (shortness of breath) on exertion, and Venous insufficiency.  PSH:    Past Surgical History:  Procedure Laterality Date   APPENDECTOMY     CARDIAC CATHETERIZATION     CATARACT EXTRACTION W/PHACO Right 12/26/2021   Procedure: CATARACT EXTRACTION PHACO AND INTRAOCULAR LENS PLACEMENT (IOC) RIGHT;  Surgeon: Mittie Gaskin, MD;  Location: [facility];  Service: Ophthalmology;  Laterality: Right;  4.81 00:44.4   COLONOSCOPY N/A 09/11/2023   Procedure: COLONOSCOPY;  Surgeon: Onita Elspeth Ozell, DO;  Location: [facility] ENDOSCOPY;  Service: Gastroenterology;  Laterality: N/A;  COLONOSCOPY WITH PROPOFOL  N/A 07/10/2023   Procedure: COLONOSCOPY WITH PROPOFOL ;  Surgeon: Onita Elspeth Sharper, DO;  Location: [facility] ENDOSCOPY;  Service:  Gastroenterology;  Laterality: N/A;   ENDOVENOUS ABLATION SAPHENOUS VEIN W/ LASER Right    EYE SURGERY     LEG SKIN LESION  BIOPSY / EXCISION Right    POLYPECTOMY  07/10/2023   Procedure: POLYPECTOMY;  Surgeon: Onita Elspeth Sharper, DO;  Location: [facility] END[facility];  Service: Gastroenterology;;   POLYPECTOMY  09/11/2023   Procedure: POLYPECTOMY;  Surgeon: Onita Elspeth Sharper, DO;  Location: [facility] ENDOSCOPY;  Service: Gastroenterology;;    Current Outpatient Medications  Medication Sig Dispense Refill   allopurinol  (ZYLOPRIM ) 100 MG tablet Take 1 tablet (100 mg total) by mouth daily. 90 tablet 3   amLODipine  (NORVASC ) 10 MG tablet TAKE 1 TABLET(10 MG) BY MOUTH DAILY 90 tablet 0   betamethasone  valerate ointment (VALISONE ) 0.1 % Apply 1 Application topically 2 (two) times daily. 30 g 0   brimonidine  (ALPHAGAN ) 0.2 % ophthalmic solution Place 1 drop into the right eye 2 (two) times daily.     carvedilol  (COREG ) 25 MG tablet Take 1 tablet (25 mg total) by mouth 2 (two) times daily with a meal. 60 tablet 0   docusate sodium  (STOOL SOFTENER) 100 MG capsule Take 100 mg by mouth daily.     finasteride  (PROSCAR ) 5 MG tablet Take 1 tablet (5 mg total) by mouth daily. 90 tablet 3   furosemide  (LASIX ) 40 MG tablet TAKE 1 TABLET(40 MG) BY MOUTH DAILY AS NEEDED 90 tablet 0   glucosamine-chondroitin 500-400 MG tablet Take 1 tablet by mouth 3 (three) times daily.     lisinopril  (ZESTRIL ) 40 MG tablet Take 1 tablet (40 mg total) by mouth daily. 90 tablet 3   lovastatin  (MEVACOR ) 20 MG tablet Take 1 tablet (20 mg total) by mouth daily. 90 tablet 1   Multiple Vitamin (MULTIVITAMIN) tablet Take 1 tablet by mouth daily.     Omega-3 Fatty Acids (FISH OIL) 1000 MG CPDR Take by mouth.     rivaroxaban  (XARELTO ) 20 MG TABS tablet TAKE 1 TABLET(20 MG) BY MOUTH DAILY WITH SUPPER 90 tablet 0   No current facility-administered medications for this visit.    Allergies:   Other   Social History:  The patient  reports  that he has quit smoking. His smoking use included cigarettes. He has quit using smokeless tobacco. He reports that he does not drink alcohol and does not use drugs.   Family History:   family history includes Diabetes in his father; Hypertension in his mother.    Review of Systems: Review of Systems  Constitutional: Negative.   Respiratory: Negative.    Cardiovascular:  Positive for leg swelling.  Gastrointestinal: Negative.   Musculoskeletal: Negative.   Neurological: Negative.   Psychiatric/Behavioral: Negative.    All other systems reviewed and are negative.  PHYSICAL EXAM: VS:  BP (!) 140/78 (BP Location: Left Arm, Patient Position: Sitting, Cuff Size: Normal)   Pulse 66   Ht 6' 1 (1.854 m)   Wt 292 lb 2 oz (132.5 kg)   SpO2 99%   BMI 38.54 kg/m  , BMI Body mass index is 38.54 kg/m. Constitutional:  oriented to person, place, and time. No distress.  HENT:  Head: Normocephalic and atraumatic.  Eyes:  no discharge. No scleral icterus.  Neck: Normal range of motion. Neck supple. No JVD present.  Cardiovascular: Irregularly irregular , normal heart sounds and intact distal pulses. Exam reveals no gallop  and no friction rub.  Lower extremity edema, 1+ No murmur heard. Pulmonary/Chest: Effort normal and breath sounds normal. No stridor. No respiratory distress.  no wheezes.  no rales.  no tenderness.  Abdominal: Soft.  no distension.  no tenderness.  Musculoskeletal: Normal range of motion.  no  tenderness or deformity.  Neurological:  normal muscle tone. Coordination normal. No atrophy Skin: Skin is warm and dry. No rash noted. not diaphoretic.  Psychiatric:  normal mood and affect. behavior is normal. Thought content normal.   Recent Labs: 06/06/2023: TSH 2.55 02/14/2024: Hemoglobin 15.6; Platelets 206 02/23/2024: BUN 14; Creatinine, Ser 0.94; Potassium 3.7; Sodium 141 04/01/2024: ALT 20    Lipid Panel Lab Results  Component Value Date   CHOL 92 02/23/2024   HDL 29.40  (L) 02/23/2024   LDLCALC 48 02/23/2024   TRIG 70.0 02/23/2024      Wt Readings from Last 3 Encounters:  05/04/24 292 lb 2 oz (132.5 kg)  02/26/24 299 lb (135.6 kg)  02/14/24 295 lb (133.8 kg)     ASSESSMENT AND PLAN: Chronic atrial fibrillation (HCC) -  On xarelto , rate controlled on carvedilol  CHADS VASC 4 Lasix  40 daily, appears euvolemic - Not a good candidate for ablation, now permanent A-fib  Pulmonary edema Continue Lasix  40 daily, extra Lasix  for worsening leg swelling or weight gain or shortness of breath  Hematuria Tolerating Xarelto , no recurrence Hemoglobin stable  Essential hypertension - Blood pressure high end of the range, no changes made  Hypercholesterolemia Cholesterol is at goal on the current lipid regimen. No changes to the medications were made.  Venous insufficiency Prior history of vein surgery May 2019 with cellulitis Exacerbated by weight Wears compression hose, stable  Encounter for anticoagulation discussion and counseling on Xarelto , no complications  Obstructive sleep apnea On CPAP, compliant, new machine  Pulmonary hypertension Mildly elevated right heart pressures, on Lasix  40 daily  Non-rheumatic tricuspid valve insufficiency moderate TR per the previous echo Continue Lasix  daily with extra as needed for shortness of breath  Morbid obesity (HCC) Changing diet, weight trending downward  Atrial septal defect  no  mention of atrial septal defect on limited echocardiogram done at outside office.  Periodic echo could be done to evaluate right heart pressures/strain    Orders Placed This Encounter  Procedures   EKG 12-Lead     Signed, Velinda Lunger, M.D., Ph.D. 05/04/2024  [facility] [place], [place] [phone]

## 2024-05-04 ENCOUNTER — Encounter: Payer: Self-pay | Admitting: Cardiovascular Disease

## 2024-05-04 ENCOUNTER — Ambulatory Visit: Attending: Cardiovascular Disease | Admitting: Cardiovascular Disease

## 2024-05-04 VITALS — BP 140/78 | HR 66 | Ht 73.0 in | Wt 292.1 lb

## 2024-05-04 DIAGNOSIS — I272 Pulmonary hypertension, unspecified: Secondary | ICD-10-CM | POA: Diagnosis not present

## 2024-05-04 DIAGNOSIS — J810 Acute pulmonary edema: Secondary | ICD-10-CM | POA: Insufficient documentation

## 2024-05-04 DIAGNOSIS — E782 Mixed hyperlipidemia: Secondary | ICD-10-CM | POA: Insufficient documentation

## 2024-05-04 DIAGNOSIS — I482 Chronic atrial fibrillation, unspecified: Secondary | ICD-10-CM | POA: Insufficient documentation

## 2024-05-04 DIAGNOSIS — I872 Venous insufficiency (chronic) (peripheral): Secondary | ICD-10-CM | POA: Insufficient documentation

## 2024-05-04 DIAGNOSIS — Q2111 Secundum atrial septal defect: Secondary | ICD-10-CM | POA: Diagnosis not present

## 2024-05-04 DIAGNOSIS — R0602 Shortness of breath: Secondary | ICD-10-CM | POA: Diagnosis not present

## 2024-05-04 DIAGNOSIS — I10 Essential (primary) hypertension: Secondary | ICD-10-CM | POA: Diagnosis not present

## 2024-05-04 MED ORDER — FUROSEMIDE 40 MG PO TABS: ORAL_TABLET | ORAL | 1 refills | Status: AC

## 2024-05-04 MED ORDER — RIVAROXABAN 20 MG PO TABS: 20.0000 mg | ORAL_TABLET | Freq: Every day | ORAL | 3 refills | Status: AC

## 2024-05-04 MED ORDER — CARVEDILOL 25 MG PO TABS: 25.0000 mg | ORAL_TABLET | Freq: Two times a day (BID) | ORAL | 3 refills | Status: AC

## 2024-05-04 MED ORDER — AMLODIPINE BESYLATE 10 MG PO TABS: ORAL_TABLET | ORAL | 3 refills | Status: AC

## 2024-05-04 MED ORDER — POTASSIUM CHLORIDE CRYS ER 10 MEQ PO TBCR: 10.0000 meq | EXTENDED_RELEASE_TABLET | Freq: Every day | ORAL | 3 refills | Status: AC

## 2024-05-04 NOTE — Patient Instructions (Addendum)
Medication Instructions:   Please start potassium 10 meq daily  If you need a refill on your cardiac medications before your next appointment, please call your pharmacy.   Lab work: No new labs needed  Testing/Procedures: No new testing needed  Follow-Up: At [facility], you and your health needs are our priority.  As part of our continuing mission to provide you with exceptional heart care, we have created designated Provider Care Teams.  These Care Teams include your primary Cardiologist (physician) and Advanced Practice Providers (APPs -  Physician Assistants and Nurse Practitioners) who all work together to provide you with the care you need, when you need it.  You will need a follow up appointment in 12 months  Providers on your designated Care Team:   Christopher Berge, NP Ryan Dunn, PA-C Cadence Furth, PA-C  COVID-19 Vaccine Information can be found at: [email or web address] For questions related to vaccine distribution or appointments, please email [email or web address] or call [phone].   

## 2024-05-21 ENCOUNTER — Ambulatory Visit
Admission: EM | Admit: 2024-05-21 | Discharge: 2024-05-21 | Disposition: A | Discharge status: Home / Self Care | Attending: Emergency Medicine | Admitting: Emergency Medicine

## 2024-05-21 ENCOUNTER — Ambulatory Visit

## 2024-05-21 DIAGNOSIS — H81399 Other peripheral vertigo, unspecified ear: Secondary | ICD-10-CM | POA: Diagnosis not present

## 2024-05-21 DIAGNOSIS — R2689 Other abnormalities of gait and mobility: Secondary | ICD-10-CM | POA: Diagnosis not present

## 2024-05-21 DIAGNOSIS — R42 Dizziness and giddiness: Secondary | ICD-10-CM

## 2024-05-21 DIAGNOSIS — S0990XS Unspecified injury of head, sequela: Secondary | ICD-10-CM

## 2024-05-21 MED ORDER — MECLIZINE HCL 25 MG PO TABS: 25.0000 mg | ORAL_TABLET | Freq: Three times a day (TID) | ORAL | 0 refills | Status: AC | PRN

## 2024-05-21 NOTE — ED Triage Notes (Addendum)
 Pt present dizziness and nausea, symptoms started about 4 am today.  Pt states experiencing  left ear fullness

## 2024-05-21 NOTE — Discharge Instructions (Addendum)
 Take the meclizine 25 mg every 8 hours as needed for dizziness.  Be mindful of this medication will make you sleepy so you do not drink alcohol or drive if you take it.  Avoid any sudden movements that might intensify your dizziness.  If you are feeling unsteady on your feet you may would consider using a cane to help with stability.  Remove any tripping hazards from your home.  Use nonslip mats in the bath and shower.  Avoid caffeine, alcohol, salt, tobacco as these may make your symptoms worse.  Make sure that you are drinking plenty of fluids to maintain hydration.  Make sure that you are getting plenty of sleep.  Sit or lie down if you feel dizzy.  If you develop any severe headache, changes in vision, numbness or weakness in any of your extremities, or nausea and vomiting where you cannot keep down medications you need to go to the ER for evaluation.

## 2024-05-21 NOTE — ED Provider Notes (Signed)
 [facility]    CSN: [id number] Arrival date & time: 05/21/24  1134      History   Chief Complaint Chief Complaint  Patient presents with   Nausea   Dizziness    HPI Michael Blevins is a 73 y.o. male.   HPI  73 year old male with past medical history significant for atrial fibrillation, essential hypertension, high cholesterol, obstructive sleep apnea, and vertigo presents for evaluation of dizziness and nausea that started at 4 AM this morning.  He will also occasionally experience changes to his hearing in his left ear.  He denies any headache, changes in vision, numbness, tingling, or weakness.  Past Medical History:  Diagnosis Date   Allergy    cats and pollen   Arthritis    Asthma    Atrial fibrillation (HCC)    Atrial septal defect    Atrial septal defect    Back pain    Cellulitis    Dyspnea    Gout    Hematuria    History of chicken pox    History of kidney stones    Hyperglycemia    Hyperlipidemia    Hypertension    Impaired fasting glucose    Motion sickness    Primary osteoarthritis of left knee    Pulmonary hypertension (HCC)    Sleep apnea    SOB (shortness of breath) on exertion    Venous insufficiency     Patient Active Problem List   Diagnosis Date Noted   Fall 03/07/2024   Hyperbilirubinemia 02/26/2024   Left hip pain 11/02/2023   Abnormal liver function tests 10/22/2023   Dog scratch 08/05/2023   Hemorrhoid 06/15/2023   Persistent cough 02/04/2023   History of COVID-19 02/04/2023   Gout 11/17/2022   SOB (shortness of breath) 10/14/2022   Rash 06/30/2022   Acquired thrombophilia 09/16/2021   Hyperglycemia 05/19/2020   Elevated hemoglobin 11/01/2018   Pulmonary hypertension (HCC) 11/02/2017   Lymphedema 10/28/2017   Cellulitis 10/12/2017   Dizziness 08/03/2017   Impaired fasting glucose 03/17/2017   Arthritis of left knee 12/03/2016   Primary osteoarthritis of left knee 11/26/2016   Encounter for therapeutic drug  monitoring 07/17/2016   Atrial septal defect 07/05/2016   SOB (shortness of breath) on exertion 05/20/2016   Atrial septal defect, secundum 05/20/2016   HLD (hyperlipidemia) 07/11/2015   BP (high blood pressure) 07/11/2015   Elevated fasting blood sugar 07/11/2015   Calculus of kidney 07/11/2015   Health care maintenance 11/06/2014   Atrial fibrillation (HCC) 07/25/2014   Essential hypertension 07/25/2014   Hypercholesterolemia 07/25/2014   Nephrolithiasis 07/25/2014   Obstructive sleep apnea syndrome 07/25/2014   Venous insufficiency 07/25/2014   Apnea, sleep 02/11/2014   H/O cardiac catheterization 02/11/2014   Hematuria 10/15/2011   Skin lesion 10/15/2011   Back ache 09/26/2011   Adiposity 09/26/2011    Past Surgical History:  Procedure Laterality Date   APPENDECTOMY     CARDIAC CATHETERIZATION     CATARACT EXTRACTION W/PHACO Right 12/26/2021   Procedure: CATARACT EXTRACTION PHACO AND INTRAOCULAR LENS PLACEMENT (IOC) RIGHT;  Surgeon: Mittie Gaskin, MD;  Location: [facility];  Service: Ophthalmology;  Laterality: Right;  4.81 00:44.4   COLONOSCOPY N/A 09/11/2023   Procedure: COLONOSCOPY;  Surgeon: Onita Elspeth Ozell, DO;  Location: [facility];  Service: Gastroenterology;  Laterality: N/A;   COLONOSCOPY WITH PROPOFOL  N/A 07/10/2023   Procedure: COLONOSCOPY WITH PROPOFOL ;  Surgeon: Onita Elspeth Ozell, DO;  Location: [facility] ENDOSCOPY;  Service: Gastroenterology;  Laterality:  N/A;   ENDOVENOUS ABLATION SAPHENOUS VEIN W/ LASER Right    EYE SURGERY     LEG SKIN LESION  BIOPSY / EXCISION Right    POLYPECTOMY  07/10/2023   Procedure: POLYPECTOMY;  Surgeon: Onita Elspeth Sharper, DO;  Location: [facility] ENDOSCOPY;  Service: Gastroenterology;;   POLYPECTOMY  09/11/2023   Procedure: POLYPECTOMY;  Surgeon: Onita Elspeth Sharper, DO;  Location: [facility] ENDOSCOPY;  Service: Gastroenterology;;       Home Medications    Prior to Admission medications  Medication Sig  Start Date End Date Taking? Authorizing Provider  meclizine (ANTIVERT) 25 MG tablet Take 1 tablet (25 mg total) by mouth 3 (three) times daily as needed for dizziness. 05/21/24  Yes Bernardino Ditch, NP  allopurinol  (ZYLOPRIM ) 100 MG tablet Take 1 tablet (100 mg total) by mouth daily. 06/12/23   Glendia Shad, MD  amLODipine  (NORVASC ) 10 MG tablet TAKE 1 TABLET(10 MG) BY MOUTH DAILY 05/04/24   Gollan, Timothy J, MD  betamethasone  valerate ointment (VALISONE ) 0.1 % Apply 1 Application topically 2 (two) times daily. 10/29/23   Narendra, Nischal, MD  brimonidine  (ALPHAGAN ) 0.2 % ophthalmic solution Place 1 drop into the right eye 2 (two) times daily. 05/03/22   [provider]  carvedilol  (COREG ) 25 MG tablet Take 1 tablet (25 mg total) by mouth 2 (two) times daily with a meal. 05/04/24   Gollan, Evalene PARAS, MD  docusate sodium  (STOOL SOFTENER) 100 MG capsule Take 100 mg by mouth daily.    [provider]  finasteride  (PROSCAR ) 5 MG tablet Take 1 tablet (5 mg total) by mouth daily. 06/12/23   Glendia Shad, MD  furosemide  (LASIX ) 40 MG tablet TAKE 1 TABLET(40 MG) BY MOUTH DAILY AS NEEDED 05/04/24   Gollan, Timothy J, MD  glucosamine-chondroitin 500-400 MG tablet Take 1 tablet by mouth 3 (three) times daily.    [provider]  lisinopril  (ZESTRIL ) 40 MG tablet Take 1 tablet (40 mg total) by mouth daily. 10/22/23   Glendia Shad, MD  lovastatin  (MEVACOR ) 20 MG tablet Take 1 tablet (20 mg total) by mouth daily. 02/26/24   Glendia Shad, MD  Multiple Vitamin (MULTIVITAMIN) tablet Take 1 tablet by mouth daily.    [provider]  Omega-3 Fatty Acids (FISH OIL) 1000 MG CPDR Take by mouth.    [provider]  potassium chloride  (KLOR-CON  M) 10 MEQ tablet Take 1 tablet (10 mEq total) by mouth daily. 05/04/24   Gollan, Timothy J, MD  rivaroxaban  (XARELTO ) 20 MG TABS tablet Take 1 tablet (20 mg total) by mouth daily with supper. 05/04/24   Gollan, Timothy J, MD     Family History Family History  Problem Relation Age of Onset   Hypertension Mother    Diabetes Father    Kidney disease Neg Hx    Prostate cancer Neg Hx     Social History Social History[1]   Allergies   Other   Review of Systems Review of Systems  Eyes:  Negative for visual disturbance.  Gastrointestinal:  Positive for nausea. Negative for vomiting.  Neurological:  Positive for dizziness. Negative for syncope, speech difficulty, weakness, numbness and headaches.     Physical Exam Triage Vital Signs ED Triage Vitals [05/21/24 1242]  Encounter Vitals Group     BP      Girls Systolic BP Percentile      Girls Diastolic BP Percentile      Boys Systolic BP Percentile      Boys Diastolic BP Percentile  Pulse      Resp      Temp      Temp src      SpO2      Weight 290 lb 6.4 oz (131.7 kg)     Height      Head Circumference      Peak Flow      Pain Score 0     Pain Loc      Pain Education      Exclude from Growth Chart    No data found.  Updated Vital Signs BP (!) 159/98 (BP Location: Left Arm)   Pulse 88   Temp 97.8 F (36.6 C) (Oral)   Resp 18   Wt 290 lb 6.4 oz (131.7 kg)   SpO2 100%   BMI 38.31 kg/m   Visual Acuity Right Eye Distance:   Left Eye Distance:   Bilateral Distance:    Right Eye Near:   Left Eye Near:    Bilateral Near:     Physical Exam Vitals and nursing note reviewed.  Constitutional:      Appearance: Normal appearance. He is not ill-appearing.  HENT:     Head: Normocephalic and atraumatic.     Right Ear: Tympanic membrane, ear canal and external ear normal. There is no impacted cerumen.     Left Ear: Tympanic membrane, ear canal and external ear normal. There is no impacted cerumen.  Skin:    General: Skin is warm and dry.     Capillary Refill: Capillary refill takes less than 2 seconds.  Neurological:     General: No focal deficit present.     Mental Status: He is alert and oriented to person, place, and time.      Cranial Nerves: No cranial nerve deficit.     Sensory: No sensory deficit.     Motor: No weakness.     Coordination: Coordination normal.     Gait: Gait normal.     Deep Tendon Reflexes: Reflexes normal.      UC Treatments / Results  Labs (all labs ordered are listed, but only abnormal results are displayed) Labs Reviewed - No data to display  EKG   Radiology CT Head Wo Contrast Result Date: 05/21/2024 EXAM: CT HEAD WITHOUT 05/21/2024 01:23:03 PM TECHNIQUE: CT of the head was performed without the administration of intravenous contrast. Automated exposure control, iterative reconstruction, and/or weight based adjustment of the mA/kV was utilized to reduce the radiation dose to as low as reasonably achievable. COMPARISON: CT head 02/14/2024. CLINICAL HISTORY: Vertigo, peripheral. FINDINGS: BRAIN AND VENTRICLES: No acute intracranial hemorrhage. No mass effect or midline shift. No extra-axial fluid collection. No evidence of acute infarct. No hydrocephalus. There is overall similar mild scattered white matter hypodensities which are nonspecific but most commonly represent chronic microvascular ischemic changes. ORBITS: The right native loculated lens is replaced. SINUSES AND MASTOIDS: Bilateral concha bullosa. SOFT TISSUES AND SKULL: No acute skull fracture. No acute soft tissue abnormality. Resolution of right periorbital soft tissue swelling. IMPRESSION: 1. No acute intracranial abnormality. 2. No substantial change since February 14, 2024. Electronically signed by: Prentice Spade 05/21/2024 01:56 PM EST RP Workstation: [id number]    Procedures Procedures (including critical care time)  Medications Ordered in UC Medications - No data to display  Initial Impression / Assessment and Plan / UC Course  I have reviewed the triage vital signs and the nursing notes.  Pertinent labs & imaging results that were available during my care of the patient were  reviewed by me and considered in my  medical decision making (see chart for details).   Patient is a nontoxic-appearing 73 year old male presenting for evaluation of room spinning and nausea that began at 4 AM this morning.  He reports that he got out of bed to use the bathroom and the symptoms began.  They are associated with changes in position, movement, and turning of the head.  There is associated transient muffled hearing in the left ear as well.  Patient Deleta has had vertigo in the past but that is typically associated with air travel.  He is denying any headache.  The patient did fall and suffered head trauma several months ago and had a negative CT scan at that time.  Physical exam reveals cranial nerves II through XII intact.  He is moving all extremities independently and he has got 5/5 bilateral grip strength, upper extremity strength, and lower extremity strength.  DTRs are 1+ globally.  Normal finger-to-nose and heel-to-shin.  I suspect this is most likely vertigo though I cannot provoke the symptoms in the exam room.  Due to the fact that he recently had head trauma I will obtain a CT scan without contrast of his head to rule out any acute intracranial process.  Head CT independently reviewed and evaluated by me.  Impression: No acute infarct or mass lesion noted.  Radiology overread is pending. Radiology impression states no acute intracranial abnormality.  Will discharge patient with diagnosis of vertigo with prescription for meclizine 25 mg he can take every 8 hours as needed for his symptoms.  This should have control his nausea as well.  If he develops any worsening dizziness, change in vision, vomiting or he cannot keep down medications or fluids, or headache that he needs to go to the ER for evaluation.   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  Be mindful of this medication will make you sleepy so you do not drink alcohol or drive if you take it.  Avoid any sudden movements that might intensify your dizziness.  If you are feeling unsteady on your feet you may would consider using a cane to help with stability.  Remove any tripping hazards from your home.  Use nonslip mats in the bath and shower.  Avoid caffeine, alcohol, salt, tobacco as these may make your symptoms worse.  Make sure that you are drinking plenty of fluids to maintain hydration.  Make sure that you are getting plenty of sleep.  Sit or lie down if you feel dizzy.  If you develop any severe headache, changes in vision, numbness or weakness in any of your extremities, or nausea and vomiting where you cannot keep down medications you need to go to the ER for evaluation.      ED Prescriptions     Medication Sig Dispense Auth. Provider   meclizine (ANTIVERT) 25 MG tablet Take 1 tablet (25 mg total) by mouth 3 (three) times daily as needed for dizziness. 30 tablet Bernardino Ditch, NP      PDMP not reviewed this encounter.     [1]  Social History Tobacco Use   Smoking status: Former    Types: Cigarettes   Smokeless tobacco: Former   Tobacco comments:    quit  33 years  Vaping Use   Vaping status: Never Used  Substance Use Topics   Alcohol use: No    Alcohol/week: 0.0 standard drinks of alcohol   Drug use: No     Bernardino Ditch, NP 05/21/24 1407

## 2024-05-27 ENCOUNTER — Encounter: Payer: Self-pay | Admitting: Internal Medicine

## 2024-05-27 ENCOUNTER — Ambulatory Visit: Admitting: Internal Medicine

## 2024-05-27 VITALS — BP 130/80 | HR 58 | Temp 98.2°F | Ht 73.0 in | Wt 295.2 lb

## 2024-05-27 DIAGNOSIS — I872 Venous insufficiency (chronic) (peripheral): Secondary | ICD-10-CM | POA: Diagnosis not present

## 2024-05-27 DIAGNOSIS — K828 Other specified diseases of gallbladder: Secondary | ICD-10-CM | POA: Diagnosis not present

## 2024-05-27 DIAGNOSIS — E7800 Pure hypercholesterolemia, unspecified: Secondary | ICD-10-CM

## 2024-05-27 DIAGNOSIS — I272 Pulmonary hypertension, unspecified: Secondary | ICD-10-CM

## 2024-05-27 DIAGNOSIS — R42 Dizziness and giddiness: Secondary | ICD-10-CM | POA: Diagnosis not present

## 2024-05-27 DIAGNOSIS — I4891 Unspecified atrial fibrillation: Secondary | ICD-10-CM | POA: Diagnosis not present

## 2024-05-27 DIAGNOSIS — I10 Essential (primary) hypertension: Secondary | ICD-10-CM | POA: Diagnosis not present

## 2024-05-27 DIAGNOSIS — R739 Hyperglycemia, unspecified: Secondary | ICD-10-CM

## 2024-05-27 DIAGNOSIS — R319 Hematuria, unspecified: Secondary | ICD-10-CM | POA: Diagnosis not present

## 2024-05-27 DIAGNOSIS — G4733 Obstructive sleep apnea (adult) (pediatric): Secondary | ICD-10-CM | POA: Diagnosis not present

## 2024-05-27 LAB — LIPID PANEL
Cholesterol: 106 mg/dL (ref 28–200)
HDL: 35 mg/dL — ABNORMAL LOW (ref >39.00)
LDL Cholesterol: 54 mg/dL (ref 10–99)
NonHDL: 71.26
Total CHOL/HDL Ratio: 3
Triglycerides: 87 mg/dL (ref 10.0–149.0)
VLDL: 17.4 mg/dL (ref 0.0–40.0)

## 2024-05-27 LAB — CBC WITH DIFFERENTIAL/PLATELET
Basophils Absolute: 0.1 K/uL (ref 0.0–0.1)
Basophils Relative: 0.7 % (ref 0.0–3.0)
Eosinophils Absolute: 0.3 K/uL (ref 0.0–0.7)
Eosinophils Relative: 2.6 % (ref 0.0–5.0)
HCT: 47.3 % (ref 39.0–52.0)
Hemoglobin: 15.8 g/dL (ref 13.0–17.0)
Lymphocytes Relative: 17.9 % (ref 12.0–46.0)
Lymphs Abs: 1.9 K/uL (ref 0.7–4.0)
MCHC: 33.5 g/dL (ref 30.0–36.0)
MCV: 89.2 fl (ref 78.0–100.0)
Monocytes Absolute: 1 K/uL (ref 0.1–1.0)
Monocytes Relative: 8.8 % (ref 3.0–12.0)
Neutro Abs: 7.6 K/uL (ref 1.4–7.7)
Neutrophils Relative %: 70 % (ref 43.0–77.0)
Platelets: 182 K/uL (ref 150.0–400.0)
RBC: 5.3 Mil/uL (ref 4.22–5.81)
RDW: 14.3 % (ref 11.5–15.5)
WBC: 10.9 K/uL — ABNORMAL HIGH (ref 4.0–10.5)

## 2024-05-27 LAB — BASIC METABOLIC PANEL WITH GFR
BUN: 17 mg/dL (ref 6–23)
CO2: 31 meq/L (ref 19–32)
Calcium: 9.6 mg/dL (ref 8.4–10.5)
Chloride: 103 meq/L (ref 96–112)
Creatinine, Ser: 0.9 mg/dL (ref 0.40–1.50)
GFR: 84.38 mL/min (ref >60.00)
Glucose, Bld: 84 mg/dL (ref 70–99)
Potassium: 4.1 meq/L (ref 3.5–5.1)
Sodium: 142 meq/L (ref 135–145)

## 2024-05-27 LAB — HEPATIC FUNCTION PANEL
ALT: 27 U/L (ref 3–53)
AST: 25 U/L (ref 5–37)
Albumin: 4.3 g/dL (ref 3.5–5.2)
Alkaline Phosphatase: 76 U/L (ref 39–117)
Bilirubin, Direct: 0.5 mg/dL — ABNORMAL HIGH (ref 0.1–0.3)
Total Bilirubin: 1.7 mg/dL — ABNORMAL HIGH (ref 0.2–1.2)
Total Protein: 6.7 g/dL (ref 6.0–8.3)

## 2024-05-27 LAB — TSH: TSH: 2.46 u[IU]/mL (ref 0.35–5.50)

## 2024-05-27 LAB — HEMOGLOBIN A1C: Hgb A1c MFr Bld: 5.7 % (ref 4.6–6.5)

## 2024-05-27 MED ORDER — DOXYCYCLINE HYCLATE 100 MG PO TABS: 100.0000 mg | ORAL_TABLET | Freq: Two times a day (BID) | ORAL | 0 refills | Status: DC

## 2024-05-27 MED ORDER — LOVASTATIN 20 MG PO TABS: 20.0000 mg | ORAL_TABLET | Freq: Every day | ORAL | 1 refills | Status: AC

## 2024-05-27 NOTE — Progress Notes (Signed)
 "  Subjective:    Patient ID: Michael Blevins, male    DOB: November 29, 1950, 73 y.o.   MRN: [id number]  Patient here for  Chief Complaint  Patient presents with   Medical Management of Chronic Issues    HPI Here for a scheduled follow up - follow up regarding afib, sleep apnea, hypercholesterolemia and hypertension. Continues cpap. Followed by cardiology - afib - on xarelto . Had f/u with Dr Gollan 05/04/24 - stable. Continue xarelto  and carvedilol . Also - lasix  daily. Moderate TR per previous echo. Saw surgery 04/20/24 - evaluation - ultrasound revealed cholelithiasis with acute cholecystitis, probable diffuse gallbladder sludge. Nodularity of the liver. Surgery felt no further w/up warranted.  Discussed. Would like to have second opinion. Evaluated UC 05/21/24 - dizziness. States when started - woke up - 4:00 am dizzy and associated emesis. Worse - rolled to left - dizziness. Left ear plugged/pressure. CT head - no acute abnormality. Diagnosed with vertigo. Given rx for meclizine . Also took rx for doxycycline . Still with some dizziness/light headedness. Still reproducible. No chest pain. Breathing stable. No increased sob. No abdominal pain or bowel change.    Past Medical History:  Diagnosis Date   Allergy    cats and pollen   Arthritis    Asthma    Atrial fibrillation (HCC)    Atrial septal defect    Atrial septal defect    Back pain    Cellulitis    Dyspnea    Glaucoma 3 years ago   Gout    Hematuria    History of chicken pox    History of kidney stones    Hyperglycemia    Hyperlipidemia    Hypertension    Impaired fasting glucose    Motion sickness    Primary osteoarthritis of left knee    Pulmonary hypertension (HCC)    Sleep apnea    SOB (shortness of breath) on exertion    Venous insufficiency    Past Surgical History:  Procedure Laterality Date   APPENDECTOMY     CARDIAC CATHETERIZATION     CATARACT EXTRACTION W/PHACO Right 12/26/2021   Procedure: CATARACT  EXTRACTION PHACO AND INTRAOCULAR LENS PLACEMENT (IOC) RIGHT;  Surgeon: Mittie Gaskin, MD;  Location: [facility];  Service: Ophthalmology;  Laterality: Right;  4.81 00:44.4   COLONOSCOPY N/A 09/11/2023   Procedure: COLONOSCOPY;  Surgeon: Onita Elspeth Ozell, DO;  Location: [facility] END[facility];  Service: Gastroenterology;  Laterality: N/A;   COLONOSCOPY WITH PROPOFOL  N/A 07/10/2023   Procedure: COLONOSCOPY WITH PROPOFOL ;  Surgeon: Onita Elspeth Ozell, DO;  Location: [facility] ENDOSCOPY;  Service: Gastroenterology;  Laterality: N/A;   ENDOVENOUS ABLATION SAPHENOUS VEIN W/ LASER Right    EYE SURGERY     LEG SKIN LESION  BIOPSY / EXCISION Right    POLYPECTOMY  07/10/2023   Procedure: POLYPECTOMY;  Surgeon: Onita Elspeth Ozell, DO;  Location: [facility] ENDOSCOPY;  Service: Gastroenterology;;   POLYPECTOMY  09/11/2023   Procedure: POLYPECTOMY;  Surgeon: Onita Elspeth Ozell, DO;  Location: [facility] ENDOSCOPY;  Service: Gastroenterology;;   Family History  Problem Relation Age of Onset   Hypertension Mother    Diabetes Father    Kidney disease Neg Hx    Prostate cancer Neg Hx    Social History   Socioeconomic History   Marital status: Married    Spouse name: Not on file   Number of children: Not on file   Years of education: Not on file   Highest education level: Not on file  Occupational History  Occupation: retired  Tobacco Use   Smoking status: Former    Types: Cigarettes   Smokeless tobacco: Former   Tobacco comments:    quit 33 years  Vaping Use   Vaping status: Never Used  Substance and Sexual Activity   Alcohol use: No    Alcohol/week: 0.0 standard drinks of alcohol   Drug use: No   Sexual activity: Not on file  Other Topics Concern   Not on file  Social History Narrative   Not on file   Social Drivers of Health   Tobacco Use: Medium Risk (06/06/2024)   Patient History    Smoking Tobacco Use: Former    Smokeless Tobacco Use: Former    Passive Exposure: Not on Programmer, Applications Strain: Low Risk  (04/24/2023)   Received from [facility]   Overall Financial Resource Strain (CARDIA)    Difficulty of Paying Living Expenses: Not very hard  Food Insecurity: No Food Insecurity (04/24/2023)   Received from [facility]   Epic    Within the past 12 months, you worried that your food would run out before you got the money to buy more.: Never true    Within the past 12 months, the food you bought just didn't last and you didn't have money to get more.: Never true  Transportation Needs: No Transportation Needs (04/24/2023)   Received from [facility] - Transportation    In the past 12 months, has lack of transportation kept you from medical appointments or from getting medications?: No    Lack of Transportation (Non-Medical): No  Physical Activity: Not on file  Stress: Not on file  Social Connections: Not on file  Depression (PHQ2-9): Low Risk (02/26/2024)   Depression (PHQ2-9)    PHQ-2 Score: 0  Alcohol Screen: Not on file  Housing: Low Risk  (09/22/2023)   Received from [facility]   Epic    In the last 12 months, was there a time when you were not able to pay the mortgage or rent on time?: No    In the past 12 months, how many times have you moved where you were living?: 0    At any time in the past 12 months, were you homeless or living in a shelter (including now)?: No  Utilities: Not At Risk (04/24/2023)   Received from [facility] Utilities    Threatened with loss of utilities: No  Health Literacy: Not on file     Review of Systems  Constitutional:  Negative for appetite change and unexpected weight change.  HENT:  Negative for congestion and sore throat.        Left ear plugged. Pressure.   Respiratory:  Negative for cough, chest tightness and shortness of breath.   Cardiovascular:  Negative for chest pain and palpitations.        No increased swelling.   Gastrointestinal:  Negative for abdominal pain, diarrhea, nausea and vomiting.  Genitourinary:  Negative for difficulty urinating and dysuria.  Musculoskeletal:  Negative for joint swelling and myalgias.  Skin:  Negative for color change and rash.  Neurological:  Negative for dizziness and headaches.  Psychiatric/Behavioral:  Negative for agitation and dysphoric mood.        Objective:     BP 130/80   Pulse (!) 58   Temp 98.2 F (36.8 C) (Oral)   Ht 6' 1 (1.854 m)  Wt 295 lb 3.2 oz (133.9 kg)   SpO2 97%   BMI 38.95 kg/m  Wt Readings from Last 3 Encounters:  05/27/24 295 lb 3.2 oz (133.9 kg)  05/21/24 290 lb 6.4 oz (131.7 kg)  05/04/24 292 lb 2 oz (132.5 kg)    Physical Exam Vitals reviewed.  Constitutional:      General: He is not in acute distress.    Appearance: Normal appearance. He is well-developed.  HENT:     Head: Normocephalic and atraumatic.     Right Ear: Ear canal and external ear normal.     Left Ear: Ear canal and external ear normal.     Ears:     Comments: No increased erythema - TM    Mouth/Throat:     Pharynx: No oropharyngeal exudate or posterior oropharyngeal erythema.  Eyes:     General: No scleral icterus.       Right eye: No discharge.        Left eye: No discharge.     Conjunctiva/sclera: Conjunctivae normal.  Cardiovascular:     Rate and Rhythm: Normal rate and regular rhythm.  Pulmonary:     Effort: Pulmonary effort is normal. No respiratory distress.     Breath sounds: Normal breath sounds.  Abdominal:     General: Bowel sounds are normal.     Palpations: Abdomen is soft.     Tenderness: There is no abdominal tenderness.  Musculoskeletal:        General: No swelling or tenderness.     Cervical back: Neck supple. No tenderness.  Lymphadenopathy:     Cervical: No cervical adenopathy.  Skin:    Findings: No erythema or rash.  Neurological:     Mental Status: He is alert.  Psychiatric:        Mood and  Affect: Mood normal.        Behavior: Behavior normal.         Outpatient Encounter Medications as of 05/27/2024  Medication Sig   allopurinol  (ZYLOPRIM ) 100 MG tablet Take 1 tablet (100 mg total) by mouth daily.   amLODipine  (NORVASC ) 10 MG tablet TAKE 1 TABLET(10 MG) BY MOUTH DAILY   betamethasone  valerate ointment (VALISONE ) 0.1 % Apply 1 Application topically 2 (two) times daily.   brimonidine  (ALPHAGAN ) 0.2 % ophthalmic solution Place 1 drop into the right eye 2 (two) times daily.   carvedilol  (COREG ) 25 MG tablet Take 1 tablet (25 mg total) by mouth 2 (two) times daily with a meal.   docusate sodium  (STOOL SOFTENER) 100 MG capsule Take 100 mg by mouth daily.   doxycycline  (VIBRA -TABS) 100 MG tablet Take 1 tablet (100 mg total) by mouth 2 (two) times daily.   finasteride  (PROSCAR ) 5 MG tablet Take 1 tablet (5 mg total) by mouth daily.   furosemide  (LASIX ) 40 MG tablet TAKE 1 TABLET(40 MG) BY MOUTH DAILY AS NEEDED   glucosamine-chondroitin 500-400 MG tablet Take 1 tablet by mouth 3 (three) times daily.   lisinopril  (ZESTRIL ) 40 MG tablet Take 1 tablet (40 mg total) by mouth daily.   meclizine  (ANTIVERT ) 25 MG tablet Take 1 tablet (25 mg total) by mouth 3 (three) times daily as needed for dizziness.   Multiple Vitamin (MULTIVITAMIN) tablet Take 1 tablet by mouth daily.   Omega-3 Fatty Acids (FISH OIL) 1000 MG CPDR Take by mouth.   potassium chloride  (KLOR-CON  M) 10 MEQ tablet Take 1 tablet (10 mEq total) by mouth daily.   rivaroxaban  (XARELTO ) 20 MG TABS  tablet Take 1 tablet (20 mg total) by mouth daily with supper.   lovastatin  (MEVACOR ) 20 MG tablet Take 1 tablet (20 mg total) by mouth daily.   [DISCONTINUED] lovastatin  (MEVACOR ) 20 MG tablet Take 1 tablet (20 mg total) by mouth daily.   No facility-administered encounter medications on file as of 05/27/2024.     Lab Results  Component Value Date   WBC 10.9 (H) 05/27/2024   HGB 15.8 05/27/2024   HCT 47.3 05/27/2024   PLT  182.0 05/27/2024   GLUCOSE 84 05/27/2024   CHOL 106 05/27/2024   TRIG 87.0 05/27/2024   HDL 35.00 (L) 05/27/2024   LDLDIRECT 77.0 04/06/2018   LDLCALC 54 05/27/2024   ALT 27 05/27/2024   AST 25 05/27/2024   NA 142 05/27/2024   K 4.1 05/27/2024   CL 103 05/27/2024   CREATININE 0.90 05/27/2024   BUN 17 05/27/2024   CO2 31 05/27/2024   TSH 2.46 05/27/2024   PSA 0.50 11/12/2023   INR 3.0 10/24/2021   HGBA1C 5.7 05/27/2024    CT Head Wo Contrast Result Date: 05/21/2024 EXAM: CT HEAD WITHOUT 05/21/2024 01:23:03 PM TECHNIQUE: CT of the head was performed without the administration of intravenous contrast. Automated exposure control, iterative reconstruction, and/or weight based adjustment of the mA/kV was utilized to reduce the radiation dose to as low as reasonably achievable. COMPARISON: CT head 02/14/2024. CLINICAL HISTORY: Vertigo, peripheral. FINDINGS: BRAIN AND VENTRICLES: No acute intracranial hemorrhage. No mass effect or midline shift. No extra-axial fluid collection. No evidence of acute infarct. No hydrocephalus. There is overall similar mild scattered white matter hypodensities which are nonspecific but most commonly represent chronic microvascular ischemic changes. ORBITS: The right native loculated lens is replaced. SINUSES AND MASTOIDS: Bilateral concha bullosa. SOFT TISSUES AND SKULL: No acute skull fracture. No acute soft tissue abnormality. Resolution of right periorbital soft tissue swelling. IMPRESSION: 1. No acute intracranial abnormality. 2. No substantial change since February 14, 2024. Electronically signed by: Prentice Spade 05/21/2024 01:56 PM EST RP Workstation: [id number]       Assessment & Plan:  Dizziness Assessment & Plan: Persistent intermittent dizziness as outlined. Reproducible on exam - when looking to left. Left ear fullness. Refer to ENT for further evaluation and treatment.   Orders: -     Ambulatory referral to ENT  Essential  hypertension Assessment & Plan: On carvedilol , amlodipine  and lisinopril . Blood pressure as outlined. Check metabolic panel today.   Orders: -     Basic metabolic panel with GFR  Hypercholesterolemia Assessment & Plan: On lovastatin .  Low cholesterol diet and exercise.  Check lpid panel today.  Lab Results  Component Value Date   CHOL 106 05/27/2024   HDL 35.00 (L) 05/27/2024   LDLCALC 54 05/27/2024   LDLDIRECT 77.0 04/06/2018   TRIG 87.0 05/27/2024   CHOLHDL 3 05/27/2024     Orders: -     TSH -     Hepatic function panel -     Lipid panel -     CBC with Differential/Platelet  Hyperglycemia Assessment & Plan: Low carb diet and exercise. Follow met b and A1c.  Lab Results  Component Value Date   HGBA1C 5.7 05/27/2024     Orders: -     Hemoglobin A1c  Hematuria, unspecified type Assessment & Plan: Has been worked up by urology previously.  On finasteride .  Recent hematuria. Urinalysis recheck - no red blood cells.    Venous insufficiency Assessment & Plan: Continue compression hose. Swelling improved.  Pulmonary hypertension (HCC) Assessment & Plan: Continue cpap. Continue f/u with cardiology.    Obstructive sleep apnea syndrome Assessment & Plan: Continue cpap. Uses regularly and benefiting from use.    Hyperbilirubinemia Assessment & Plan: Elevated bilirubin with slightly elevated direct bilirubin.  Saw surgery 04/20/24 - evaluation - ultrasound revealed cholelithiasis with acute cholecystitis, probable diffuse gallbladder sludge. Nodularity of the liver. Surgery felt no further w/up warranted.  Discussed. Would like to have second opinion.   Orders: -     Ambulatory referral to General Surgery  Atrial fibrillation, unspecified type [facility]) Assessment & Plan: Followed by cardiology. On xarelto .  Continue carvedilol . Stable. Continue f/u with cardiology.    Gallbladder sludge Assessment & Plan: Elevated bilirubin.  Saw surgery 04/20/24 -  evaluation - ultrasound revealed cholelithiasis with acute cholecystitis, probable diffuse gallbladder sludge. Nodularity of the liver. Surgery felt no further w/up warranted.  Discussed. Would like to have second opinion.   Orders: -     Ambulatory referral to General Surgery  Other orders -     Lovastatin ; Take 1 tablet (20 mg total) by mouth daily.  Dispense: 90 tablet; Refill: 1 -     Doxycycline  Hyclate; Take 1 tablet (100 mg total) by mouth 2 (two) times daily.  Dispense: 14 tablet; Refill: 0     Allena Hamilton, MD "

## 2024-05-28 ENCOUNTER — Ambulatory Visit: Payer: Self-pay | Admitting: Internal Medicine

## 2024-06-06 ENCOUNTER — Encounter: Payer: Self-pay | Admitting: Internal Medicine

## 2024-06-06 DIAGNOSIS — K828 Other specified diseases of gallbladder: Secondary | ICD-10-CM | POA: Insufficient documentation

## 2024-06-06 NOTE — Assessment & Plan Note (Signed)
 Elevated bilirubin.  Saw surgery 04/20/24 - evaluation - ultrasound revealed cholelithiasis with acute cholecystitis, probable diffuse gallbladder sludge. Nodularity of the liver. Surgery felt no further w/up warranted.  Discussed. Would like to have second opinion.

## 2024-06-06 NOTE — Assessment & Plan Note (Signed)
 Has been worked up by urology previously.  On finasteride .  Recent hematuria. Urinalysis recheck - no red blood cells.

## 2024-06-06 NOTE — Assessment & Plan Note (Signed)
 Continue cpap. Uses regularly and benefiting from use.

## 2024-06-06 NOTE — Assessment & Plan Note (Signed)
 Continue cpap. Continue f/u with cardiology.

## 2024-06-06 NOTE — Assessment & Plan Note (Signed)
 Elevated bilirubin with slightly elevated direct bilirubin.  Saw surgery 04/20/24 - evaluation - ultrasound revealed cholelithiasis with acute cholecystitis, probable diffuse gallbladder sludge. Nodularity of the liver. Surgery felt no further w/up warranted.  Discussed. Would like to have second opinion.

## 2024-06-06 NOTE — Assessment & Plan Note (Signed)
 On lovastatin .  Low cholesterol diet and exercise.  Check lpid panel today.  Lab Results  Component Value Date   CHOL 106 05/27/2024   HDL 35.00 (L) 05/27/2024   LDLCALC 54 05/27/2024   LDLDIRECT 77.0 04/06/2018   TRIG 87.0 05/27/2024   CHOLHDL 3 05/27/2024

## 2024-06-06 NOTE — Assessment & Plan Note (Signed)
 Persistent intermittent dizziness as outlined. Reproducible on exam - when looking to left. Left ear fullness. Refer to ENT for further evaluation and treatment.

## 2024-06-06 NOTE — Assessment & Plan Note (Signed)
 Followed by cardiology. On xarelto .  Continue carvedilol . Stable. Continue f/u with cardiology.

## 2024-06-06 NOTE — Assessment & Plan Note (Signed)
 Continue compression hose.  Swelling improved.

## 2024-06-06 NOTE — Assessment & Plan Note (Signed)
 On carvedilol , amlodipine  and lisinopril . Blood pressure as outlined. Check metabolic panel today.

## 2024-06-06 NOTE — Assessment & Plan Note (Signed)
 Low carb diet and exercise. Follow met b and A1c.  Lab Results  Component Value Date   HGBA1C 5.7 05/27/2024

## 2024-06-09 ENCOUNTER — Other Ambulatory Visit: Payer: Self-pay | Admitting: General Surgery

## 2024-06-09 DIAGNOSIS — K8020 Calculus of gallbladder without cholecystitis without obstruction: Secondary | ICD-10-CM

## 2024-06-09 DIAGNOSIS — R17 Unspecified jaundice: Secondary | ICD-10-CM

## 2024-06-11 ENCOUNTER — Other Ambulatory Visit: Payer: Self-pay | Admitting: General Surgery

## 2024-06-11 DIAGNOSIS — K8020 Calculus of gallbladder without cholecystitis without obstruction: Secondary | ICD-10-CM

## 2024-06-11 DIAGNOSIS — R17 Unspecified jaundice: Secondary | ICD-10-CM

## 2024-06-12 ENCOUNTER — Ambulatory Visit
Admission: RE | Admit: 2024-06-12 | Discharge: 2024-06-12 | Disposition: A | Discharge status: Home / Self Care | Source: Ambulatory Visit | Attending: General Surgery | Admitting: General Surgery

## 2024-06-12 DIAGNOSIS — K8020 Calculus of gallbladder without cholecystitis without obstruction: Secondary | ICD-10-CM | POA: Insufficient documentation

## 2024-06-12 DIAGNOSIS — R17 Unspecified jaundice: Secondary | ICD-10-CM | POA: Diagnosis present

## 2024-06-12 MED ORDER — GADOBUTROL 1 MMOL/ML IV SOLN
10.0000 mL | Freq: Once | INTRAVENOUS | Status: AC | PRN
  Administered 2024-06-12: 10 mL via INTRAVENOUS

## 2024-06-30 ENCOUNTER — Encounter: Payer: Self-pay | Admitting: Internal Medicine

## 2024-06-30 NOTE — Telephone Encounter (Signed)
 Ok.

## 2024-07-05 ENCOUNTER — Telehealth: Admitting: Internal Medicine

## 2024-07-05 ENCOUNTER — Ambulatory Visit: Admitting: Internal Medicine

## 2024-07-05 ENCOUNTER — Encounter: Payer: Self-pay | Admitting: Internal Medicine

## 2024-07-05 VITALS — BP 132/86 | HR 74 | Ht 73.0 in | Wt 295.0 lb

## 2024-07-05 DIAGNOSIS — I4891 Unspecified atrial fibrillation: Secondary | ICD-10-CM | POA: Diagnosis not present

## 2024-07-05 DIAGNOSIS — R739 Hyperglycemia, unspecified: Secondary | ICD-10-CM

## 2024-07-05 DIAGNOSIS — K828 Other specified diseases of gallbladder: Secondary | ICD-10-CM

## 2024-07-05 DIAGNOSIS — I10 Essential (primary) hypertension: Secondary | ICD-10-CM | POA: Diagnosis not present

## 2024-07-05 MED ORDER — FINASTERIDE 5 MG PO TABS: 5.0000 mg | ORAL_TABLET | Freq: Every day | ORAL | 3 refills | Status: AC

## 2024-07-05 NOTE — Assessment & Plan Note (Signed)
 Low carb diet and exercise. Follow met b and A1c.  Lab Results  Component Value Date   HGBA1C 5.7 05/27/2024

## 2024-07-05 NOTE — Assessment & Plan Note (Signed)
 Followed by cardiology. On xarelto .  Continue carvedilol . Stable. Continue f/u with cardiology.

## 2024-07-05 NOTE — Progress Notes (Signed)
 Patient ID: Michael Blevins, male   DOB: [birth year], 74 y.o.   MRN: [id number]   Virtual Visit via video Note  I connected with Ozell Loring by a video enabled telemedicine application and verified that I am speaking with the correct person using two identifiers. Location patient: home Location provider: home  Persons participating in the virtual visit: patient, provider  The limitations, risks, security and privacy concerns of performing an evaluation and management service by video and the availability of in person appointments have been discussed. It has also been discussed with the patient that there may be a patient responsible charge related to this service. The patient expressed understanding and agreed to proceed.   Reason for visit: work in appt  HPI: Work in appt to discuss recent MRI. Was recently evaluated for elevated bilirubin. Ultrasound revealed cholelithiasis without acute cholecystitis, diffuse GB sludge. Saw Dr Cesar - recommended MRCP. MRCP 06/12/24 - Sludge-filled gallbladder with 2 small stones. No acute cholecystitis or biliary duct dilatation. Cystic foci within the gallbladder fundus are most consistent with adenomyomatosis.  7 mm posterior interpolar  right renal lesion is favored to represent a hemorrhagic / proteinaceous cyst, but is too small to characterize. Consider pre and post-contrast abdominal MRI follow up at 1 year to confirm size stability and further characterize. Had f/u with Dr Cesar 06/18/24 - to discuss further w/up. Discussed gallbladder removal. Recommended referral to GI - further evaluation of elevated bilirubin. Questions answered regarding above. Agree with GI evaluation. He is feeling well. No abdominal pain or bowel change. No nausea or vomiting. Breathing stable.    ROS: See pertinent positives and negatives per HPI.  Past Medical History:  Diagnosis Date   Allergy    cats and pollen   Arthritis    Asthma    Atrial fibrillation (HCC)     Atrial septal defect    Atrial septal defect    Back pain    Cellulitis    Dyspnea    Glaucoma 3 years ago   Gout    Hematuria    History of chicken pox    History of kidney stones    Hyperglycemia    Hyperlipidemia    Hypertension    Impaired fasting glucose    Motion sickness    Primary osteoarthritis of left knee    Pulmonary hypertension (HCC)    Sleep apnea    SOB (shortness of breath) on exertion    Venous insufficiency     Past Surgical History:  Procedure Laterality Date   APPENDECTOMY     CARDIAC CATHETERIZATION     CATARACT EXTRACTION W/PHACO Right 12/26/2021   Procedure: CATARACT EXTRACTION PHACO AND INTRAOCULAR LENS PLACEMENT (IOC) RIGHT;  Surgeon: Mittie Gaskin, MD;  Location: [facility];  Service: Ophthalmology;  Laterality: Right;  4.81 00:44.4   COLONOSCOPY N/A 09/11/2023   Procedure: COLONOSCOPY;  Surgeon: Onita Elspeth Ozell, DO;  Location: [facility] END[facility]COPY;  Service: Gastroenterology;  Laterality: N/A;   COLONOSCOPY WITH PROPOFOL  N/A 07/10/2023   Procedure: COLONOSCOPY WITH PROPOFOL ;  Surgeon: Onita Elspeth Ozell, DO;  Location: [facility] ENDOSCOPY;  Service: Gastroenterology;  Laterality: N/A;   ENDOVENOUS ABLATION SAPHENOUS VEIN W/ LASER Right    EYE SURGERY     LEG SKIN LESION  BIOPSY / EXCISION Right    POLYPECTOMY  07/10/2023   Procedure: POLYPECTOMY;  Surgeon: Onita Elspeth Ozell, DO;  Location: [facility] ENDOSCOPY;  Service: Gastroenterology;;   POLYPECTOMY  09/11/2023   Procedure: POLYPECTOMY;  Surgeon: Onita Elspeth Ozell,  DO;  Location: [facility] ENDOSCOPY;  Service: Gastroenterology;;    Family History  Problem Relation Age of Onset   Hypertension Mother    Diabetes Father    Kidney disease Neg Hx    Prostate cancer Neg Hx     SOCIAL HX: reviewed.   Current Medications[1] Reviewed.   EXAM:  GENERAL: alert, oriented, appears well and in no acute distress  HEENT: atraumatic, conjunttiva clear, no obvious abnormalities on  inspection of external nose and ears  NECK: normal movements of the head and neck  LUNGS: on inspection no signs of respiratory distress, breathing rate appears normal, no obvious gross SOB, gasping or wheezing  CV: no obvious cyanosis  PSYCH/NEURO: pleasant and cooperative, no obvious depression or anxiety, speech and thought processing grossly intact  ASSESSMENT AND PLAN:  Discussed the following assessment and plan:  Problem List Items Addressed This Visit     Atrial fibrillation (HCC) - Primary   Followed by cardiology. On xarelto .  Continue carvedilol . Stable. Continue f/u with cardiology.       Essential hypertension   On carvedilol , amlodipine  and lisinopril . Blood pressure has been controlled no changes in medication today.       Gallbladder sludge    Saw Dr Cesar - recommended MRCP. MRCP 06/12/24 - Sludge-filled gallbladder with 2 small stones. No acute cholecystitis or biliary duct dilatation. Cystic foci within the gallbladder fundus are most consistent with adenomyomatosis.  7 mm posterior interpolar  right renal lesion is favored to represent a hemorrhagic / proteinaceous cyst, but is too small to characterize. Consider pre and post-contrast abdominal MRI follow up at 1 year to confirm size stability and further characterize. Had f/u with Dr Cesar 06/18/24 - to discuss further w/up. Discussed gallbladder removal. Recommended referral to GI - further evaluation of elevated bilirubin. Questions answered regarding above. Agree with GI evaluation. He is feeling well. No abdominal pain or bowel change. No nausea or vomiting. Contact GI regarding appt.       Hyperbilirubinemia    Saw Dr Cesar - recommended MRCP. MRCP 06/12/24 - Sludge-filled gallbladder with 2 small stones. No acute cholecystitis or biliary duct dilatation. Cystic foci within the gallbladder fundus are most consistent with adenomyomatosis.  7 mm posterior interpolar  right renal lesion is favored to represent a  hemorrhagic / proteinaceous cyst, but is too small to characterize. Consider pre and post-contrast abdominal MRI follow up at 1 year to confirm size stability and further characterize. Had f/u with Dr Cesar 06/18/24 - to discuss further w/up. Discussed gallbladder removal. Recommended referral to GI - further evaluation of elevated bilirubin. Questions answered regarding above. Agree with GI evaluation. He is feeling well. No abdominal pain or bowel change. No nausea or vomiting. Contact GI regarding appt.       Hyperglycemia   Low carb diet and exercise. Follow met b and A1c.  Lab Results  Component Value Date   HGBA1C 5.7 05/27/2024          No follow-ups on file.   I discussed the assessment and treatment plan with the patient. The patient was provided an opportunity to ask questions and all were answered. The patient agreed with the plan and demonstrated an understanding of the instructions.   The patient was advised to call back or seek an in-person evaluation if the symptoms worsen or if the condition fails to improve as anticipated.    Allena Hamilton, MD       [1]  Current Outpatient Medications:  allopurinol  (ZYLOPRIM ) 100 MG tablet, Take 1 tablet (100 mg total) by mouth daily., Disp: 90 tablet, Rfl: 3   amLODipine  (NORVASC ) 10 MG tablet, TAKE 1 TABLET(10 MG) BY MOUTH DAILY, Disp: 90 tablet, Rfl: 3   brimonidine  (ALPHAGAN ) 0.2 % ophthalmic solution, Place 1 drop into the right eye 2 (two) times daily., Disp: , Rfl:    carvedilol  (COREG ) 25 MG tablet, Take 1 tablet (25 mg total) by mouth 2 (two) times daily with a meal., Disp: 180 tablet, Rfl: 3   furosemide  (LASIX ) 40 MG tablet, TAKE 1 TABLET(40 MG) BY MOUTH DAILY AS NEEDED, Disp: 90 tablet, Rfl: 1   glucosamine-chondroitin 500-400 MG tablet, Take 1 tablet by mouth 3 (three) times daily., Disp: , Rfl:    lisinopril  (ZESTRIL ) 40 MG tablet, Take 1 tablet (40 mg total) by mouth daily., Disp: 90 tablet, Rfl: 3   lovastatin   (MEVACOR ) 20 MG tablet, Take 1 tablet (20 mg total) by mouth daily., Disp: 90 tablet, Rfl: 1   meclizine  (ANTIVERT ) 25 MG tablet, Take 1 tablet (25 mg total) by mouth 3 (three) times daily as needed for dizziness., Disp: 30 tablet, Rfl: 0   Multiple Vitamin (MULTIVITAMIN) tablet, Take 1 tablet by mouth daily., Disp: , Rfl:    Omega-3 Fatty Acids (FISH OIL) 1000 MG CPDR, Take by mouth., Disp: , Rfl:    potassium chloride  (KLOR-CON  M) 10 MEQ tablet, Take 1 tablet (10 mEq total) by mouth daily., Disp: 90 tablet, Rfl: 3   rivaroxaban  (XARELTO ) 20 MG TABS tablet, Take 1 tablet (20 mg total) by mouth daily with supper., Disp: 90 tablet, Rfl: 3   finasteride  (PROSCAR ) 5 MG tablet, Take 1 tablet (5 mg total) by mouth daily., Disp: 90 tablet, Rfl: 3

## 2024-07-05 NOTE — Assessment & Plan Note (Signed)
"   Saw Dr Cesar - recommended MRCP. MRCP 06/12/24 - Sludge-filled gallbladder with 2 small stones. No acute cholecystitis or biliary duct dilatation. Cystic foci within the gallbladder fundus are most consistent with adenomyomatosis.  7 mm posterior interpolar  right renal lesion is favored to represent a hemorrhagic / proteinaceous cyst, but is too small to characterize. Consider pre and post-contrast abdominal MRI follow up at 1 year to confirm size stability and further characterize. Had f/u with Dr Cesar 06/18/24 - to discuss further w/up. Discussed gallbladder removal. Recommended referral to GI - further evaluation of elevated bilirubin. Questions answered regarding above. Agree with GI evaluation. He is feeling well. No abdominal pain or bowel change. No nausea or vomiting. Contact GI regarding appt.  "

## 2024-07-05 NOTE — Assessment & Plan Note (Signed)
 On carvedilol , amlodipine  and lisinopril . Blood pressure has been controlled no changes in medication today.

## 2024-07-06 NOTE — Telephone Encounter (Signed)
 FYI, also Ronda from Gi called to notify you as well

## 2024-09-27 ENCOUNTER — Other Ambulatory Visit

## 2024-09-30 ENCOUNTER — Ambulatory Visit: Admitting: Internal Medicine
